# Patient Record
Sex: Male | Born: 1955
Health system: Western US, Academic
[De-identification: ages and names within clinical notes are randomized; demographics above are authoritative.]

## PROBLEM LIST (undated history)

## (undated) DIAGNOSIS — C61 Malignant neoplasm of prostate: Secondary | ICD-10-CM

## (undated) HISTORY — PX: CRYOTHERAPY: SHX1416

## (undated) HISTORY — PX: PROSTATE SURGERY: SHX751

---

## 2019-02-16 LAB — COLOGUARD

## 2020-03-31 ENCOUNTER — Telehealth: Payer: PRIVATE HEALTH INSURANCE

## 2020-03-31 NOTE — Telephone Encounter
Good Afternoon,     Diagnosis-newly diagnosed or second opinion?  New Dx: Hodgkin's Lymphoma     Type of insurance/Is auth needed? If so, status of auth?  [employer] PPO     Date/Time of when appt was scheduled  10/28 at 11:30 AM     Referring MD/ph. Number  Dr. Lizabeth Leyden, N/A     Status of medical records  Patient's spouse will have the medical records faxed to [place]     Where new patient paperwork is supposed to be sent  In the office     Any other pertinent information  N/A

## 2020-04-03 ENCOUNTER — Ambulatory Visit: Payer: PRIVATE HEALTH INSURANCE

## 2020-04-03 DIAGNOSIS — R42 Dizziness and giddiness: Secondary | ICD-10-CM

## 2020-04-03 DIAGNOSIS — R590 Localized enlarged lymph nodes: Secondary | ICD-10-CM

## 2020-04-03 DIAGNOSIS — Z8546 Personal history of malignant neoplasm of prostate: Secondary | ICD-10-CM

## 2020-04-03 DIAGNOSIS — Z7189 Other specified counseling: Secondary | ICD-10-CM

## 2020-04-03 MED ORDER — HYDROCODONE-ACETAMINOPHEN 5-325 MG PO TABS
1 | ORAL_TABLET | Freq: Four times a day (QID) | ORAL | 0 refills | Status: AC | PRN
Start: 2020-04-03 — End: ?

## 2020-04-03 NOTE — Patient Instructions
A pleasure to meet you.  As you know you had had 6 months of gradually progressive intermittent abdominal and now chest pain with some weight loss and difficulty eating normal sized meals. Your Oct 2021 chest CT angiogram showed bulky mediastinal and hilar lymph nodes. Your presentation is highly suggestive of lymphoma.   I recommend ASAP PET scan for full body staging and then ASAP biopsy of the best area on PET to make a diagnosis.    Once we have a tissue diagnosis I will discuss prognosis and best treatment which can easily be pursued in [place].    Placeholder appt in 2 weeks  Sign up on the [facility] PORTAL to send me messages and see results    For pain, try Norco 5/325 one to 2 up to 4 x a day as need for pain (watch for constipation on this rx)

## 2020-04-03 NOTE — Consults
TITLE:  HEMATOLOGY ONCOLOGY CONSULTATION    DATE OF SERVICE:  04/03/2020    REFERRING PHYSICIAN:  Patrick Jupiter, MD       HEMATOLOGIST ONCOLOGIST:  Theressa Millard, MD     Thank you, Dr. Myrtice Lauth, for involving me in the care of this pleasant gentleman.    CHIEF COMPLAINT:  This 64 year old male without significant past medical history except for prostate cancer was referred with chest and abdominal pain and bulky mediastinal adenopathy discovered on recent imaging. Heme-Onc evaluation is requested.    HISTORY OF PRESENT ILLNESS:  The patient has been in generally excellent health. He keeps himself fit and has been a runner. He was, however, diagnosed with prostate cancer, apparently Gleason 7, back in 2013 and underwent a cryoablation procedure at [facility]. To date, he had been on regular followup with [facility] for the first 5 years since then has done annual PSA evaluations with Dr. Myrtice Lauth. There has been no clinical evidence of recurrence.     He noted approximately 6 months ago intermittent abdominal discomfort in the periumbilical to epigastric area with some association with early satiety and some mild weight loss. More recently, he has noticed some discomfort extending into his chest area with particularly a pleuritic component. Since this had flared recently, he went to the emergency room on 03/31/2020 and underwent a rapid appropriate workup. Labs obtained were normal but a chest CT angiogram which ruled out pulmonary emboli showed bulky mediastinal and bilateral hilar adenopathy. The patient was referred by the ER and through Dr. Myrtice Lauth for a rapid Heme-Onc evaluation.     The patient denies any fevers, sweats, anorexia, substantial weight loss, peripheral lymph node swelling, skin rash or bone pain.    OUTPATIENT MEDICATIONS:  No regular medications.    ALLERGIES:  No reported drug allergies.    PAST SURGICAL HISTORY:  Cryoablative treatment of prostate cancer and prior prostate biopsy in 2013.    PAST MEDICAL HISTORY:  1. Intermittent lightheadedness: The patient is a runner and he has noted this is apparently orthostatic, although there may be a positional component. He underwent a workup in December 2020 including a stress echo and Holter which were both negative.  2. COVID-19: Fully Pfizer vaccinated by March 2021.    REVIEW OF SYSTEMS:  As noted above. Otherwise negative in detail for additional constitutional, neurologic, dermatologic, ophthalmologic, otologic, respiratory, cardiovascular, gastrointestinal, genitourinary, or musculoskeletal symptoms.    FAMILY HISTORY:  The patient's father had prostate cancer. Paternal uncle had prostate cancer. His mother, a smoker, had bladder and esophageal cancer. The patient has two brothers and one full and one half-sister and one child. There is no other Heme-Onc history in their family.    SOCIAL HISTORY:  The patient is a never smoker. He does not drink. He consumed alcohol socially but stopped several months ago with his symptoms. He is an avid runner and keeps himself fit. 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    PHYSICAL EXAMINATION:  General: A reasonably healthy-appearing elderly male in no acute distress. Vital Signs: BP 108/75, pulse 63, respirations 16, temp 36.9. Height 175 cm, weight 60.3 kg. O2 saturation 98%. Skin: No jaundice or rash. No significant petechiae, purpura, ecchymoses. Head: Normocephalic, atraumatic. Eyes: Sclerae white. EOMI. Nasopharynx without lesions. Neck: Supple. No increased JVP. Nodes: No palpable neck, clavicular, axillary, or inguinal adenopathy. Lungs: Clear to auscultation and percussion. Heart: Regular rate and rhythm. No murmurs, rubs, or gallops. Abdomen:  Normal bowel sounds. Soft, nontender. No organomegaly or other masses. Extremities: Without cyanosis, clubbing, or edema. Neuro: Oriented, alert. Cranial nerves intact. Motor 5/5 throughout. Gait normal. Mentally normal. Grossly nonfocal.    IMAGING:  03/31/2020 chest CT angiogram: No pulmonary emboli; bulky mediastinal and hilar adenopathy, superior mediastinum 4.1 cm, AP window at 3 cm, right hilar 3.9 cm, subcarinal 6 cm, left hilar 5.1 cm maximum dimension.    LABORATORIES:  03/31/2020 CBC: White count 7.2, hemoglobin 14.0, MCV 90, platelets 274. INR 1.0. CMP normal. Creatinine 0.8.     03/03/2020 CBC was normal with a hemoglobin of 14.4. CMP normal. Creatinine 0.79.     Pathology September 2020: Cologuard negative.    IMPRESSION:  This pleasant 64 year old male presented with his wife for rapid evaluation. Of note, they had bought a house in [place] and were planning to move by in January 2022. I discussed in detail a presumptive malignant diagnosis with gradually progressive intermittent abdominal and now chest pain with some weight loss and early satiety and now bulky mediastinal and hilar adenopathy on chest CT scan. The presentation overall is most suggestive of an occult lymphoma, Hodgkin's or non-Hodgkin's. I recommended an ASAP PET scan for full body staging and to ideally identify an optimal area for tissue sampling. If there is only hilar and mediastinal adenopathy, I would then arrange rapidly for EBUS for a tissue diagnosis and then discuss in detail the optimal treatments. The patient will have a place holder appointment in 2 weeks ideally after the PET scan and biopsy. Arrangements will be made based on those results.     The patient's other issues include the following:  1. Prostate cancer: The patient has been on prostate-specific antigen surveillance and 8 years out from his cryoablative, possibly curative procedure. He apparently has appropriately low/undetectable level.   2. Coronavirus Disease 19: Fully COVID vaccinated by March 2021. I urged the patient to arrange for rapid COVID booster vaccination since his presumptive diagnosis would likely involve therapy including some degree of lymphoid immunosuppression with rituximab or other agents including prednisone.  3. Abdominal pain: I have prescribed Norco 5/325 to be used as needed and emphasized the importance of eating small amounts more frequently and using calorie supplements as needed to maintain his weight.  4. Episodic lightheadedness: Cardiac workup was negative in the past. Not a more active recent issue.  5. Possible right inguinal hernia: Noted on exam and easily reducible.   Thank you for involving me in the care of this patient.      Theressa Millard, MD [phone])        ACB/MODL CONF#: [id number]  D: 04/03/2020 12:27:05 T: 04/03/2020 13:53:16 DOCUMENT: [id number]

## 2020-04-04 ENCOUNTER — Other Ambulatory Visit: Payer: Self-pay

## 2020-04-04 ENCOUNTER — Encounter: Payer: Self-pay | Admitting: Radiation Oncology

## 2020-04-04 ENCOUNTER — Ambulatory Visit
Admission: RE | Admit: 2020-04-04 | Discharge: 2020-04-04 | Disposition: A | Discharge status: Home / Self Care | Payer: 59 | Source: Ambulatory Visit | Attending: Radiation Oncology | Admitting: Radiation Oncology

## 2020-04-04 VITALS — Ht 68.0 in | Wt 133.0 lb

## 2020-04-04 DIAGNOSIS — R590 Localized enlarged lymph nodes: Secondary | ICD-10-CM

## 2020-04-04 DIAGNOSIS — R918 Other nonspecific abnormal finding of lung field: Secondary | ICD-10-CM

## 2020-04-04 DIAGNOSIS — C61 Malignant neoplasm of prostate: Secondary | ICD-10-CM | POA: Insufficient documentation

## 2020-04-04 HISTORY — DX: Malignant neoplasm of prostate: C61

## 2020-04-04 NOTE — Addendum Note (Signed)
Encounter addended by: Tyler Pita, MD on: 04/04/2020 4:11 PM  Actions taken: Medication List reviewed, Problem List reviewed, Allergies reviewed, Problem List modified, Visit diagnoses modified, Order list changed, Diagnosis association updated

## 2020-04-04 NOTE — Progress Notes (Signed)
Patient and wife plan to be in [place] from [place] by Monday in preparation for scan on Wednesday. Patient explains that six months ago he began having GI problems. He related the diarrhea, stomach pain and indigestion to stress. Wife explains the patient is very healthy and a runner. She reports he has lost approximately 10 lbs. Patient reports swallowing food is painful. Patient goes onto explain that ten days ago he began having chest pain so severe he went to the ED. Patient explains that following a scan in the ED he was told he has "growths on his lungs." Patient states, "one month prior all my blood work was completely normal."   Patient reports intermittent chest pain 6 on a scale of 0-10 unless he "has an attack" then is a 9. Patient reports he rest in an attempt to relieve his pain.  Patient reports shortness of breath. Patient reports his ribs hurt when he takes a deep breath. Patient reports a periodic productive cough with clear phlegm. Denies hemoptysis. Denies nausea or vomiting. Reports severe sporatic headaches typically around the same time as an episode of chest pain. Denies nausea or vomiting. Reports sleeping without difficulty. Reports a normal appetite. Denies a history of smoking outside of recreational [place] as a teenager. Denies a history of radiation therapy. Denies having a pacemaker. Reports having cryotherapy in the past for prostate ca. States, "my father died of prostate cancer."  Reports he is on medical leave from [employer] presently. [name], wife, reports faxing all records over this morning to our office.

## 2020-04-06 NOTE — Progress Notes (Signed)
[place]         [phone] ________________________________  Initial Telephone Consultation  Name: Bryan [name] MRN: [id number]  Date: 04/04/2020  DOB: Dec 04, 1955  REFERRING PHYSICIAN: No ref. provider found  DIAGNOSIS: The primary encounter diagnosis was Lung mass. Diagnoses of Malignant neoplasm of prostate ([place]) and Localized enlarged lymph nodes were also pertinent to this visit.    ICD-10-CM   1. Lung mass  R91.8   2. Malignant neoplasm of prostate ([place])  C61    s/p cryotherapy in 2000 in [place], PSA is now low  3. 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 He keeps himself fit and has been a runner. He was, however, diagnosed with prostate cancer, apparently Gleason 7, back in 2013 and underwent a cryoablation procedure at [facility]. To date, he had been on regular followup with [facility] for the first 5 years since then was done followed by annual PSA evaluations with his PCP, Dr. Sheria Lang. There has been no clinical evidence of recurrence.   He had planned to move to [place] after retirement, and was in the process of doing this.   He noted approximately 6 months ago intermittent abdominal discomfort in the periumbilical to epigastric area with some association with early satiety and some mild weight loss. More recently, he has noticed some discomfort extending into his chest area with particularly a pleuritic component. Since this had flared recently, he went to the emergency room on 03/31/2020 and underwent a rapid appropriate workup. Labs obtained were normal but a chest CT angiogram which ruled out pulmonary emboli showed bulky mediastinal and bilateral hilar adenopathy. The patient was referred by the ER and through Dr. Sheria Lang for a rapid Heme-Onc evaluation, with Dr. Karleen Dolphin at [facility] in [place], [place].  Dr. Renard Hamper ordered a PET-CT to be followed by an effort to obtain tissue diagnosis.  The scan was scheduled, with a 3 week delay.  So, Mr. Chicoine took the initiative to establish care here in [place], in hopes of expediting his work-up.    The patient denies any fevers, sweats, anorexia, substantial weight loss, peripheral lymph node swelling, skin rash or bone pain.  [name]  PREVIOUS RADIATION THERAPY: No  Past Medical History:  Diagnosis Date  . Prostate cancer ([place])   .Intermittent lightheadedness: The patient is a runner and he has noted this is apparently orthostatic, although there may be a positional component. He underwent a workup in December 2020 including a stress echo and Holter which were both negative.   COVID-19: Fully Pfizer vaccinated by March 2021.   :  Past Surgical History:  Procedure Laterality Date  . CRYOTHERAPY    :   Current Outpatient Medications:  .  acetaminophen-codeine (TYLENOL #3) 300-30 MG tablet, Take by mouth every 4 (four) hours as needed for moderate pain., Disp: , Rfl: :  No Known Allergies:  Family History  Problem Relation Age of Onset  . Bladder Cancer Mother   . Esophageal cancer Mother   . Prostate cancer Father   . Prostate cancer Paternal Uncle   :  Social History   Socioeconomic History  . Marital status: Married    Spouse name: 1  . Number of children: Not on file  . Years of education: Not on file  .  Highest education level: Not on file  Occupational History  . Occupation: Futures trader: [employer]    Comment: medical leave  Tobacco Use  . Smoking status: Never Smoker  . Smokeless tobacco: Never Used  Vaping Use  . Vaping Use: Never used  Substance and Sexual Activity  . Alcohol use: Not Currently  . Drug use: Never  . Sexual activity: Yes  Other Topics Concern  . Not on file  Social History Narrative  . Not on file   Social Determinants of Health   Financial  Resource Strain:   . Difficulty of Paying Living Expenses: Not on file  Food Insecurity:   . Worried About Charity fundraiser in the Last Year: Not on file  . Ran Out of Food in the Last Year: Not on file  Transportation Needs:   . Lack of Transportation (Medical): Not on file  . Lack of Transportation (Non-Medical): Not on file  Physical Activity:   . Days of Exercise per Week: Not on file  . Minutes of Exercise per Session: Not on file  Stress:   . Feeling of Stress : Not on file  Social Connections:   . Frequency of Communication with Friends and Family: Not on file  . Frequency of Social Gatherings with Friends and Family: Not on file  . Attends Religious Services: Not on file  . Active Member of Clubs or Organizations: Not on file  . Attends Archivist Meetings: Not on file  . Marital Status: Not on file  Intimate Partner Violence:   . Fear of Current or Ex-Partner: Not on file  . Emotionally Abused: Not on file  . Physically Abused: Not on file  . Sexually Abused: Not on file  :  REVIEW OF SYSTEMS:  A 15 point review of systems is documented in the electronic medical record. This was obtained by the nursing staff. However, I reviewed this with the patient to discuss relevant findings and make appropriate changes.  Pertinent items are noted in HPI.   PHYSICAL EXAM:  Height 5\' 8"  (1.727 m), weight 133 lb (60.3 kg). Detailed exam was deferred today, since this was a telephone visit.  KPS = 80  100 - Normal; no complaints; no evidence of disease. 90   - Able to carry on normal activity; minor signs or symptoms of disease. 80   - Normal activity with effort; some signs or symptoms of disease. 58   - Cares for self; unable to carry on normal activity or to do active work. 60   - Requires occasional assistance, but is able to care for most of his personal needs. 50   - Requires considerable assistance and frequent medical care. 41   - Disabled; requires special care  and assistance. 64   - Severely disabled; hospital admission is indicated although death not imminent. 78   - Very sick; hospital admission necessary; active supportive treatment necessary. 10   - Moribund; fatal processes progressing rapidly. 0     - Dead  Karnofsky DA, Abelmann WH, Craver LS and Burchenal JH [phone]) The use of the nitrogen mustards in the palliative treatment of carcinoma: with particular reference to bronchogenic carcinoma Cancer 1 634-56  LABORATORY DATA:  No results found for: WBC, HGB, HCT, MCV, PLT No results found for: NA, K, CL, CO2 No results found for: ALT, AST, GGT, ALKPHOS, BILITOT   RADIOGRAPHY: No results found.    IMPRESSION: This pleasant 64 year old male presented with  his wife for rapid evaluation. We discussed in detail a presumptive malignant diagnosis with gradually progressive intermittent abdominal and now chest pain with some weight loss and early satiety and now bulky mediastinal and hilar adenopathy on chest CT scan. I agree with ASAP PET scan for full body staging and to ideally identify an optimal area for tissue sampling. If there is only hilar and mediastinal adenopathy, I would then arrange rapidly for EBUS for a tissue diagnosis and then discuss in detail the optimal treatments.   PLAN: PET next week.   The patient will travel to [facility] for PET Tuesday, and we'll follow-up with appropriate referrals to medical oncology and a proceduralist for tissue.  This encounter was provided by telephone.  The patient has given verbal consent for this type of encounter and has been advised to only accept a meeting of this type in a secure network environment. The time spent during this encounter was 20 minutes. The attendants for this meeting include Tyler Pita  and Durene Fruits.  During the encounter, Tyler Pita was located at [facility] Radiation Oncology Department.  Corin Tilly was located at home in [place], [place].     ------------------------------------------------   Tyler Pita, MD [place] Director and Director of Stereotactic Radiosurgery Direct Dial: [phone]  Fax: [phone] [email or web address]  Skype  [email or web address]

## 2020-04-06 NOTE — Addendum Note (Signed)
Encounter addended by: Tyler Pita, MD on: 04/06/2020 5:59 PM  Actions taken: Clinical Note Signed, Level of Service modified

## 2020-04-07 ENCOUNTER — Telehealth: Payer: Self-pay | Admitting: Radiation Oncology

## 2020-04-07 NOTE — Telephone Encounter (Signed)
Per [name]'s request, I left a vm with our fax number so she can fax over the medical records once they reach their destination this evening. [name] stated that she originally faxed over the records 04/04/20 but we never received them.

## 2020-04-08 ENCOUNTER — Encounter (HOSPITAL_COMMUNITY): Payer: Self-pay

## 2020-04-08 ENCOUNTER — Ambulatory Visit (HOSPITAL_COMMUNITY)
Admission: RE | Admit: 2020-04-08 | Discharge: 2020-04-08 | Disposition: A | Discharge status: Home / Self Care | Payer: 59 | Source: Ambulatory Visit | Attending: Radiation Oncology | Admitting: Radiation Oncology

## 2020-04-08 ENCOUNTER — Other Ambulatory Visit: Payer: Self-pay

## 2020-04-08 ENCOUNTER — Telehealth: Payer: Self-pay | Admitting: Radiation Oncology

## 2020-04-08 ENCOUNTER — Other Ambulatory Visit: Payer: Self-pay | Admitting: Radiation Oncology

## 2020-04-08 DIAGNOSIS — R599 Enlarged lymph nodes, unspecified: Secondary | ICD-10-CM

## 2020-04-08 DIAGNOSIS — R590 Localized enlarged lymph nodes: Secondary | ICD-10-CM | POA: Diagnosis not present

## 2020-04-08 LAB — GLUCOSE, CAPILLARY: Glucose-Capillary: 85 mg/dL (ref 70–99)

## 2020-04-08 MED ORDER — FLUDEOXYGLUCOSE F - 18 (FDG) INJECTION
6.6500 | Freq: Once | INTRAVENOUS | Status: AC | PRN
  Administered 2020-04-08: 6.65 via INTRAVENOUS

## 2020-04-08 NOTE — Telephone Encounter (Signed)
Received voicemail message from patient's wife, [name], requesting a return call. Phoned back to inquire. Spoke with [name] and Marcello Moores both via speaker phone. [name] reports her husband has just finished his PET scan and questions "what do we do next?" Explained it would take time to review the scan and dictate a report. Assured them both as soon as results are in they will be contacted. Both verbalized understanding and expressed appreciation for the call back.

## 2020-04-08 NOTE — Progress Notes (Unsigned)
Bryan [name] Male, 64 y.o., 1955-09-01 MRN:  [id number] Phone:  [phone] (H) PCP:  Patient, No Pcp Per Coverage:  [employer] Other Next Appt With Radiology ([facility]-US 2) 04/10/2020 at 8:00 AM Message Received: Today Message Details  Markus Daft, MD  Lenore Cordia Dr. Tammi Klippel is asking for biopsy on this patient. Ok to schedule US guided core biopsy of left axillary node / left cervical lymph node.

## 2020-04-09 ENCOUNTER — Other Ambulatory Visit: Payer: Self-pay | Admitting: Radiology

## 2020-04-09 ENCOUNTER — Other Ambulatory Visit: Payer: Self-pay | Admitting: Physician Assistant

## 2020-04-09 ENCOUNTER — Other Ambulatory Visit: Payer: Self-pay | Admitting: Student

## 2020-04-10 ENCOUNTER — Other Ambulatory Visit: Payer: Self-pay

## 2020-04-10 ENCOUNTER — Ambulatory Visit (HOSPITAL_COMMUNITY)
Admission: RE | Admit: 2020-04-10 | Discharge: 2020-04-10 | Disposition: A | Discharge status: Home / Self Care | Payer: 59 | Source: Ambulatory Visit | Attending: Radiation Oncology | Admitting: Radiation Oncology

## 2020-04-10 ENCOUNTER — Encounter (HOSPITAL_COMMUNITY): Payer: Self-pay

## 2020-04-10 ENCOUNTER — Inpatient Hospital Stay: Payer: 59

## 2020-04-10 ENCOUNTER — Telehealth: Payer: Self-pay | Admitting: Hematology

## 2020-04-10 ENCOUNTER — Inpatient Hospital Stay: Payer: 59 | Attending: Hematology | Admitting: Hematology

## 2020-04-10 VITALS — BP 99/67 | HR 55 | Temp 95.4°F | Resp 18 | Ht 68.0 in | Wt 136.8 lb

## 2020-04-10 DIAGNOSIS — Z8546 Personal history of malignant neoplasm of prostate: Secondary | ICD-10-CM | POA: Diagnosis not present

## 2020-04-10 DIAGNOSIS — C8598 Non-Hodgkin lymphoma, unspecified, lymph nodes of multiple sites: Secondary | ICD-10-CM | POA: Diagnosis present

## 2020-04-10 DIAGNOSIS — R599 Enlarged lymph nodes, unspecified: Secondary | ICD-10-CM

## 2020-04-10 DIAGNOSIS — R590 Localized enlarged lymph nodes: Secondary | ICD-10-CM | POA: Diagnosis present

## 2020-04-10 LAB — CBC
HCT: 40.4 % (ref 39.0–52.0)
Hemoglobin: 13.4 g/dL (ref 13.0–17.0)
MCH: 30.2 pg (ref 26.0–34.0)
MCHC: 33.2 g/dL (ref 30.0–36.0)
MCV: 91.2 fL (ref 80.0–100.0)
Platelets: 393 10*3/uL (ref 150–400)
RBC: 4.43 MIL/uL (ref 4.22–5.81)
RDW: 11.4 % — ABNORMAL LOW (ref 11.5–15.5)
WBC: 6.6 10*3/uL (ref 4.0–10.5)
nRBC: 0 % (ref 0.0–0.2)

## 2020-04-10 LAB — CBC WITH DIFFERENTIAL/PLATELET
Abs Immature Granulocytes: 0.03 10*3/uL (ref 0.00–0.07)
Basophils Absolute: 0.1 10*3/uL (ref 0.0–0.1)
Basophils Relative: 2 %
Eosinophils Absolute: 0.4 10*3/uL (ref 0.0–0.5)
Eosinophils Relative: 6 %
HCT: 38.9 % — ABNORMAL LOW (ref 39.0–52.0)
Hemoglobin: 13 g/dL (ref 13.0–17.0)
Immature Granulocytes: 0 %
Lymphocytes Relative: 16 %
Lymphs Abs: 1.1 10*3/uL (ref 0.7–4.0)
MCH: 30 pg (ref 26.0–34.0)
MCHC: 33.4 g/dL (ref 30.0–36.0)
MCV: 89.8 fL (ref 80.0–100.0)
Monocytes Absolute: 0.6 10*3/uL (ref 0.1–1.0)
Monocytes Relative: 9 %
Neutro Abs: 4.5 10*3/uL (ref 1.7–7.7)
Neutrophils Relative %: 67 %
Platelets: 371 10*3/uL (ref 150–400)
RBC: 4.33 MIL/uL (ref 4.22–5.81)
RDW: 11.5 % (ref 11.5–15.5)
WBC: 6.7 10*3/uL (ref 4.0–10.5)
nRBC: 0 % (ref 0.0–0.2)

## 2020-04-10 LAB — URIC ACID: Uric Acid, Serum: 6.3 mg/dL (ref 3.7–8.6)

## 2020-04-10 LAB — CMP (CANCER CENTER ONLY)
ALT: 16 U/L (ref 0–44)
AST: 17 U/L (ref 15–41)
Albumin: 3.4 g/dL — ABNORMAL LOW (ref 3.5–5.0)
Alkaline Phosphatase: 79 U/L (ref 38–126)
Anion gap: 6 (ref 5–15)
BUN: 13 mg/dL (ref 8–23)
CO2: 30 mmol/L (ref 22–32)
Calcium: 9.1 mg/dL (ref 8.9–10.3)
Chloride: 104 mmol/L (ref 98–111)
Creatinine: 0.75 mg/dL (ref 0.61–1.24)
GFR, Estimated: >60 mL/min (ref >60)
Glucose, Bld: 62 mg/dL — ABNORMAL LOW (ref 70–99)
Potassium: 3.8 mmol/L (ref 3.5–5.1)
Sodium: 140 mmol/L (ref 135–145)
Total Bilirubin: 0.3 mg/dL (ref 0.3–1.2)
Total Protein: 6.7 g/dL (ref 6.5–8.1)

## 2020-04-10 LAB — ABO/RH: ABO/RH(D): O POS

## 2020-04-10 LAB — PROTIME-INR
INR: 1 (ref 0.8–1.2)
Prothrombin Time: 12.5 seconds (ref 11.4–15.2)

## 2020-04-10 LAB — HEPATITIS B SURFACE ANTIGEN: Hepatitis B Surface Ag: NONREACTIVE

## 2020-04-10 LAB — HIV ANTIBODY (ROUTINE TESTING W REFLEX): HIV Screen 4th Generation wRfx: NONREACTIVE

## 2020-04-10 LAB — HEPATITIS B CORE ANTIBODY, TOTAL: Hep B Core Total Ab: NONREACTIVE

## 2020-04-10 LAB — HEPATITIS C ANTIBODY: HCV Ab: NONREACTIVE

## 2020-04-10 LAB — LACTATE DEHYDROGENASE: LDH: 200 U/L — ABNORMAL HIGH (ref 98–192)

## 2020-04-10 MED ORDER — FENTANYL CITRATE (PF) 100 MCG/2ML IJ SOLN
INTRAMUSCULAR | Status: AC
  Filled 2020-04-10: qty 2

## 2020-04-10 MED ORDER — PREDNISONE 50 MG PO TABS
50.0000 mg | ORAL_TABLET | Freq: Every day | ORAL | 0 refills | Status: DC
Start: 2020-04-10 — End: 2020-04-25

## 2020-04-10 MED ORDER — MIDAZOLAM HCL 2 MG/2ML IJ SOLN
INTRAMUSCULAR | Status: AC | PRN
  Administered 2020-04-10: 1 mg via INTRAVENOUS

## 2020-04-10 MED ORDER — MIDAZOLAM HCL 2 MG/2ML IJ SOLN
INTRAMUSCULAR | Status: AC
  Filled 2020-04-10: qty 2

## 2020-04-10 MED ORDER — FENTANYL CITRATE (PF) 100 MCG/2ML IJ SOLN
INTRAMUSCULAR | Status: AC | PRN
Start: 2020-04-10 — End: 2020-04-10
  Administered 2020-04-10: 25 ug via INTRAVENOUS

## 2020-04-10 MED ORDER — SODIUM CHLORIDE 0.9 % IV SOLN: INTRAVENOUS | Status: DC

## 2020-04-10 MED ORDER — LIDOCAINE HCL (PF) 1 % IJ SOLN
INTRAMUSCULAR | Status: AC
  Filled 2020-04-10: qty 30

## 2020-04-10 NOTE — Progress Notes (Signed)
Patient was given discharge instructions. He verbalized understanding. 

## 2020-04-10 NOTE — H&P (Signed)
Chief Complaint: Patient was seen in consultation today for possible left axillary/left cervical lymph node biopsy.  Referring Physician(s): Manning,Matthew  Supervising Physician: Daryll Brod  Patient Status: [facility] - Out-pt  History of Present Illness: Bryan [name] is a 64 y.o. male with a past medical history significant for prostate cancer s/p cryoablation who presents today for a possible left axillary/left cervical lymph node biopsy. Bryan [name] noted intermittent abdominal pain approximately 6 months ago with early satiety and weight loss, eventually he also began to notice chest discomfort as well and presented to the ED in [place] on 03/31/20. Imaging during his ED visit noted bulky mediastinal and bilateral hilar adenopathy and he was referred to heme/onc in [place], however his workup was to be delayed by 3 weeks so he decided to establish care in [place] instead as he was planning to move here for retirement shortly. He was seen by Dr. Tammi Klippel on 10/29 and underwent a PET scan on 11/2 which noted intensely hypermetabolic bilateral neck, axillary, mediastinal, hilar and retroperitoneal lymphadenopathy concerning for myeloproliferative disorder. He has been referred to IR for an image guided lymph node biopsy to further direct care.  Bryan [name] denies any significant pain or discomfort this morning, he continues to have some shortness of breath which is about the same as it has been for the past few months. He understands the procedure today and is agreeable to proceed as planned.  Past Medical History:  Diagnosis Date  . Prostate cancer [facility])     Past Surgical History:  Procedure Laterality Date  . CRYOTHERAPY    . PROSTATE SURGERY      Allergies: Patient has no known allergies.  Medications: Prior to Admission medications   Medication Sig Start Date End Date Taking? Authorizing Provider  acetaminophen-codeine (TYLENOL #3) 300-30 MG tablet Take by mouth every 4 (four) hours as  needed for moderate pain.   Yes [provider]     Family History  Problem Relation Age of Onset  . Bladder Cancer Mother   . Esophageal cancer Mother   . Prostate cancer Father   . Prostate cancer Paternal Uncle     Social History   Socioeconomic History  . Marital status: Married    Spouse name: 1  . Number of children: Not on file  . Years of education: Not on file  . Highest education level: Not on file  Occupational History  . Occupation: Futures trader: [employer]    Comment: medical leave  Tobacco Use  . Smoking status: Never Smoker  . Smokeless tobacco: Never Used  Vaping Use  . Vaping Use: Never used  Substance and Sexual Activity  . Alcohol use: Not Currently  . Drug use: Never  . Sexual activity: Yes  Other Topics Concern  . Not on file  Social History Narrative  . Not on file   Social Determinants of Health   Financial Resource Strain:   . Difficulty of Paying Living Expenses: Not on file  Food Insecurity:   . Worried About Charity fundraiser in the Last Year: Not on file  . Ran Out of Food in the Last Year: Not on file  Transportation Needs:   . Lack of Transportation (Medical): Not on file  . Lack of Transportation (Non-Medical): Not on file  Physical Activity:   . Days of Exercise per Week: Not on file  . Minutes of Exercise per Session: Not on file  Stress:   . Feeling of  Stress : Not on file  Social Connections:   . Frequency of Communication with Friends and Family: Not on file  . Frequency of Social Gatherings with Friends and Family: Not on file  . Attends Religious Services: Not on file  . Active Member of Clubs or Organizations: Not on file  . Attends Archivist Meetings: Not on file  . Marital Status: Not on file     Review of Systems: A 12 point ROS discussed and pertinent positives are indicated in the HPI above.  All other systems are negative.  Review of Systems  Constitutional: Negative for chills  and fever.  Respiratory: Positive for shortness of breath. Negative for cough.   Cardiovascular: Negative for chest pain.  Gastrointestinal: Negative for abdominal pain, diarrhea, nausea and vomiting.  Musculoskeletal: Negative for back pain.  Neurological: Negative for dizziness and headaches.    Vital Signs: BP 108/71   Pulse (!) 52   Temp 98.2 F (36.8 C) (Oral)   Resp 16   Ht 5\' 8"  (1.727 m)   Wt 135 lb (61.2 kg)   SpO2 99%   BMI 20.53 kg/m   Physical Exam Vitals and nursing note reviewed.  Constitutional:      General: He is not in acute distress. HENT:     Head: Normocephalic.     Mouth/Throat:     Mouth: Mucous membranes are moist.     Pharynx: Oropharynx is clear. No oropharyngeal exudate or posterior oropharyngeal erythema.  Cardiovascular:     Rate and Rhythm: Normal rate and regular rhythm.  Pulmonary:     Effort: Pulmonary effort is normal.     Breath sounds: Normal breath sounds.  Abdominal:     General: There is no distension.     Palpations: Abdomen is soft.     Tenderness: There is no abdominal tenderness.  Skin:    General: Skin is warm and dry.  Neurological:     Mental Status: He is alert and oriented to person, place, and time.  Psychiatric:        Mood and Affect: Mood normal.        Behavior: Behavior normal.        Thought Content: Thought content normal.        Judgment: Judgment normal.      MD Evaluation Airway: WNL Heart: WNL Abdomen: WNL Chest/ Lungs: WNL ASA  Classification: 2 Mallampati/Airway Score: One   Imaging: NM PET Image Initial (PI) Skull Base To Thigh  Result Date: 04/08/2020 CLINICAL DATA:  Initial treatment strategy for reported bulky mediastinal and bilateral hilar lymphadenopathy on recent outside chest CT angiogram study. History of prostate cancer in 2013. EXAM: NUCLEAR MEDICINE PET SKULL BASE TO THIGH TECHNIQUE: 6.7 mCi F-18 FDG was injected intravenously. Full-ring PET imaging was performed from the skull  base to thigh after the radiotracer. CT data was obtained and used for attenuation correction and anatomic localization. Fasting blood glucose: 85 mg/dl COMPARISON:  None. FINDINGS: Mediastinal blood pool activity: SUV max 1.9 Liver activity: SUV max 2.6 NECK: Mildly to moderately enlarged hypermetabolic bilateral neck lymph nodes involving level II on the right and levels III and IV on the left. Representative 0.9 cm right level II neck node with max SUV 15.5 (series 4/image 36). Representative 1.8 cm left level IV/supraclavicular neck node with max SUV 22.6 (series 4/image 40). Incidental CT findings: none CHEST: Mildly enlarged hypermetabolic bilateral axillary lymph nodes, largest 1.6 cm on the left with max SUV 21.5 (series  4/image 55). Bulky hypermetabolic bilateral mediastinal and bilateral hilar lymph nodes. Representative 3.3 cm short axis diameter subcarinal node with max SUV 22.1 (series 4/image 76). Representative 2.1 cm AP window node with max SUV 18.8 (series 4/image 68). Representative high right paratracheal 1.2 cm node near the thoracic inlet with max SUV 20.7 (series 4/image 56). Representative 1.1 cm anterior low paraesophageal node with max SUV 10.8 (series 4/image 100). Right hilar adenopathy with max SUV 24.9 and left hilar adenopathy with max SUV 20.3. Several necrotic lymph nodes are scattered in the mediastinum and hila, for example measuring 3.4 cm in the high right mediastinum near the thoracic inlet (series 4/image 51). No hypermetabolic pulmonary findings. Incidental CT findings: Left anterior descending and right coronary atherosclerosis. Minimally atherosclerotic nonaneurysmal thoracic aorta. No significant pulmonary nodules. ABDOMEN/PELVIS: Numerous hypermetabolic slightly hypodense masses throughout the spleen, largest 2.1 cm in the inferior spleen with max SUV 12.5 (series 4/image 120). Spleen is overall normal size. Several hypermetabolic enlarged lymph nodes throughout the  retroperitoneum involving left para celiac and aortocaval chains. Representative largely necrotic high left para celiac 3.2 cm node with max SUV 10.0 (series 4/image 113). Splenic hilar 1.2 cm node with max SUV 17.6. Aortocaval 0.7 cm node with max SUV 12.7 (series 4/image 120). No enlarged or hypermetabolic pelvic lymph nodes. No abnormal hypermetabolic activity within the liver, pancreas or adrenal glands. Incidental CT findings: Top-normal size prostate with scattered nonspecific internal prostatic calcifications. SKELETON: No focal hypermetabolic activity to suggest skeletal metastasis. Incidental CT findings: none IMPRESSION: 1. Intensely hypermetabolic bilateral neck, bilateral axillary, bilateral mediastinal, bilateral hilar and retroperitoneal lymphadenopathy. Numerous small hypermetabolic splenic masses. Findings are compatible with malignancy, favoring lymphoproliferative disorder. The most accessible pathologic lymph nodes for potential percutaneous biopsy are likely within the left axilla and left supraclavicular neck. 2. Chronic findings include: Aortic Atherosclerosis (ICD10-I70.0). Coronary atherosclerosis. Electronically Signed   By: Ilona Sorrel M.D.   On: 04/08/2020 13:58    Labs:  CBC: Recent Labs    04/10/20 0557  WBC 6.6  HGB 13.4  HCT 40.4  PLT 393    COAGS: Recent Labs    04/10/20 0557  INR 1.0    BMP: No results for input(s): NA, K, CL, CO2, GLUCOSE, BUN, CALCIUM, CREATININE, GFRNONAA, GFRAA in the last 8760 hours.  Invalid input(s): CMP  LIVER FUNCTION TESTS: No results for input(s): BILITOT, AST, ALT, ALKPHOS, PROT, ALBUMIN in the last 8760 hours.  TUMOR MARKERS: No results for input(s): AFPTM, CEA, CA199, CHROMGRNA in the last 8760 hours.  Assessment and Plan:  64 y/o M with recently noted diffuse lymphadenopathy concerning for lymphoproliferative disorder who presents today for a possible left axillary/left cervical lymph node biopsy with moderate  sedation.  Patient has been NPO since midnight, no current anticoagulation/antiplatelet medications. Afebrile, WBC 6.6, hgb 13.4, plt 393, INR 1.0.  Risks and benefits of left axillary/left cervical lymph node biopsy was discussed with the patient and/or patient's family including, but not limited to bleeding, infection, damage to adjacent structures or low yield requiring additional tests.  All of the questions were answered and there is agreement to proceed.  Consent signed and in chart.   Thank you for this interesting consult.  I greatly enjoyed meeting Bryan [name] and look forward to participating in their care.  A copy of this report was sent to the requesting provider on this date.  Electronically Signed: Joaquim Nam, PA-C 04/10/2020, 7:50 AM   I spent a total of 30 Minutes  in  face to face in clinical consultation, greater than 50% of which was counseling/coordinating care for left axillary/left cervical lymph node biopsy.

## 2020-04-10 NOTE — Telephone Encounter (Signed)
Scheduled per 11/4 los. Pt is aware of appt time and date.

## 2020-04-10 NOTE — Procedures (Signed)
Interventional Radiology Procedure Note  Procedure: [place] LEFT SUPRACLAVICULAR ADENOPATHY CORE BX     Complications: None  Estimated Blood Loss:  MIN  Findings: 35 G CORES    MDaryll Brod, MD

## 2020-04-10 NOTE — Discharge Instructions (Addendum)
Needle Biopsy, Care After These instructions tell you how to care for yourself after your procedure. Your doctor may also give you more specific instructions. Call your doctor if you have any problems or questions. What can I expect after the procedure? After the procedure, it is common to have:  Soreness.  Bruising.  Mild pain. Follow these instructions at home:   Return to your normal activities as told by your doctor. Ask your doctor what activities are safe for you.  Take over-the-counter and prescription medicines only as told by your doctor.  Wash your hands with soap and water before you change your bandage (dressing). If you cannot use soap and water, use hand sanitizer.  Follow instructions from your doctor about: ? How to take care of your puncture site. ? When and how to change your bandage. ? When to remove your bandage.  Check your puncture site every day for signs of infection. Watch for: ? Redness, swelling, or pain. ? Fluid or blood. ? Pus or a bad smell. ? Warmth.  Do not take baths, swim, or use a hot tub until your doctor approves. Ask your doctor if you may take showers. You may only be allowed to take sponge baths.  Keep all follow-up visits as told by your doctor. This is important. Contact a doctor if you have:  A fever.  Redness, swelling, or pain at the puncture site, and it lasts longer than a few days.  Fluid, blood, or pus coming from the puncture site.  Warmth coming from the puncture site. Get help right away if:  You have a lot of bleeding from the puncture site. Summary  After the procedure, it is common to have soreness, bruising, or mild pain at the puncture site.  Check your puncture site every day for signs of infection, such as redness, swelling, or pain.  Get help right away if you have severe bleeding from your puncture site. This information is not intended to replace advice given to you by your health care provider. Make  sure you discuss any questions you have with your health care provider. Document Revised: 06/06/2017 Document Reviewed: 06/06/2017 Elsevier Patient Education  2020 Elsevier Inc. Moderate Conscious Sedation, Adult Sedation is the use of medicines to promote relaxation and relieve discomfort and anxiety. Moderate conscious sedation is a type of sedation. Under moderate conscious sedation, you are less alert than normal, but you are still able to respond to instructions, touch, or both. Moderate conscious sedation is used during short medical and dental procedures. It is milder than deep sedation, which is a type of sedation under which you cannot be easily woken up. It is also milder than general anesthesia, which is the use of medicines to make you unconscious. Moderate conscious sedation allows you to return to your regular activities sooner. Tell a health care provider about:  Any allergies you have.  All medicines you are taking, including vitamins, herbs, eye drops, creams, and over-the-counter medicines.  Use of steroids (by mouth or creams).  Any problems you or family members have had with sedatives and anesthetic medicines.  Any blood disorders you have.  Any surgeries you have had.  Any medical conditions you have, such as sleep apnea.  Whether you are pregnant or may be pregnant.  Any use of cigarettes, alcohol, marijuana, or street drugs. What are the risks? Generally, this is a safe procedure. However, problems may occur, including:  Getting too much medicine (oversedation).  Nausea.  Allergic reaction to   medicines.  Trouble breathing. If this happens, a breathing tube may be used to help with breathing. It will be removed when you are awake and breathing on your own.  Heart trouble.  Lung trouble. What happens before the procedure? Staying hydrated Follow instructions from your health care provider about hydration, which may include:  Up to 2 hours before the  procedure - you may continue to drink clear liquids, such as water, clear fruit juice, black coffee, and plain tea. Eating and drinking restrictions Follow instructions from your health care provider about eating and drinking, which may include:  8 hours before the procedure - stop eating heavy meals or foods such as meat, fried foods, or fatty foods.  6 hours before the procedure - stop eating light meals or foods, such as toast or cereal.  6 hours before the procedure - stop drinking milk or drinks that contain milk.  2 hours before the procedure - stop drinking clear liquids. Medicine Ask your health care provider about:  Changing or stopping your regular medicines. This is especially important if you are taking diabetes medicines or blood thinners.  Taking medicines such as aspirin and ibuprofen. These medicines can thin your blood. Do not take these medicines before your procedure if your health care provider instructs you not to.  Tests and exams  You will have a physical exam.  You may have blood tests done to show: ? How well your kidneys and liver are working. ? How well your blood can clot. General instructions  Plan to have someone take you home from the hospital or clinic.  If you will be going home right after the procedure, plan to have someone with you for 24 hours. What happens during the procedure?  An IV tube will be inserted into one of your veins.  Medicine to help you relax (sedative) will be given through the IV tube.  The medical or dental procedure will be performed. What happens after the procedure?  Your blood pressure, heart rate, breathing rate, and blood oxygen level will be monitored often until the medicines you were given have worn off.  Do not drive for 24 hours. This information is not intended to replace advice given to you by your health care provider. Make sure you discuss any questions you have with your health care provider. Document  Revised: 05/06/2017 Document Reviewed: 09/13/2015 Elsevier Patient Education  2020 Elsevier Inc.  

## 2020-04-10 NOTE — Patient Instructions (Signed)
Thank you for choosing [facility] to provide your oncology and hematology care.   Should you have questions after your visit to the [facility] ([facility]), please contact this office at [phone] between 8:30 AM and 4:30 PM.  Voice mails left after 4:00 PM may not be returned until the following business day.  Calls received after 4:30 PM will be answered by an off-site Nurse Triage Line.    Prescription Refills:  Please have your pharmacy contact us directly for most prescription requests.  Contact the office directly for refills of narcotics (pain medications). Allow 48-72 hours for refills.  Appointments: Please contact the [facility] scheduling department [phone] for questions regarding [facility] appointment scheduling.  Contact the schedulers with any scheduling changes so that your appointment can be rescheduled in a timely manner.   Central Scheduling for [place] [phone] - Call to schedule procedures such as PET scans, CT scans, MRI, Ultrasound, etc.  To afford each patient quality time with our providers, please arrive 30 minutes before your scheduled appointment time.  If you arrive late for your appointment, you may be asked to reschedule.  We strive to give you quality time with our providers, and arriving late affects you and other patients whose appointments are after yours. If you are a no show for multiple scheduled visits, you may be dismissed from the clinic at the providers discretion.     Resources: [facility] Social Workers [phone] for additional information on assistance programs or assistance connecting with community support programs   [place] DSS  [phone]: Information regarding food stamps, Medicaid, and utility assistance [place] Access [place] [phone]   [place] [employer]'s shared-ride transportation service for eligible riders who have a disability that prevents them from riding the fixed route bus.   Medicare  Rights Center 800-333-4114 Helps people with Medicare understand their rights and benefits, navigate the Medicare system, and secure the quality healthcare they deserve [employer] 800-227-2345 Assists patients locate various types of support and financial assistance Cancer Care: 1-800-813-HOPE (4673) Provides financial assistance, online support groups, medication/co-pay assistance.   Transportation Assistance for appointments at [facility]: Transportation Coordinator [phone]  Again, thank you for choosing [facility] for your care.       

## 2020-04-10 NOTE — Progress Notes (Signed)
HEMATOLOGY/ONCOLOGY CONSULTATION NOTE  Date of Service: 04/10/2020  Patient Care Team: Patient, No Pcp Per as PCP - General (General Practice)  CHIEF COMPLAINTS/PURPOSE OF CONSULTATION:  Generalized LNadenopathy concerning for lymphoma  HISTORY OF PRESENTING ILLNESS:   Bryan [name] is a wonderful 64 y.o. male who has been referred to [place] by Dr. Tammi Klippel for evaluation and management of lung mass. Pt is accompanied today by his wife. The pt reports that he is doing well overall.   The pt reports that he was diagnosed with low-grade Prostate Cancer in 2013. Pt had no symptoms, but got a check up after his father was diagnosed with Prostate Cancer. He was only treated with cryoablation. He also has a reducible, right inguinal hernia. Pt denies any other medical conditions or chronic medications. He does take a daily fish oil and multivitamin. He has NKDA.   About 6 months ago pt began having abdominal pain after eating. He saw several physicians who could not explain his symptoms. Last month he began having extreme discomfort in his chest and red stools. He is unsure if the color of his stools was from blood or diet. Pt then began experiencing left arm pain and pleuritic chest pain, which triggered his Chest CT in October.  In the last few nights pt has had roaming headaches that are improved with OTC Tylenol. Pt has lost 4-5 pounds recently, but attributes this to retiring and running more. Pt is currently taking two Oxycodone twice per day, which is controlling his discomfort. His wife also notes that the pt appears more fatigued than usual and is napping more. Pt felt poorly for 1 week after his second COVID19 vaccine, which he recived in March. Pt received the [place] vaccine, but waiting to receive the booster.   Of note prior to the patient's visit today, pt has had PET/CT ([id number]) completed on 04/08/2020 with results revealing "1. Intensely hypermetabolic bilateral neck, bilateral  axillary, bilateral mediastinal, bilateral hilar and retroperitoneal lymphadenopathy. Numerous small hypermetabolic splenic masses. Findings are compatible with malignancy, favoring lymphoproliferative disorder. The most accessible pathologic lymph nodes for potential percutaneous biopsy are likely within the left axilla and left supraclavicular neck."   Most recent lab results (03/31/2020) of CBC is as follows: all values are WNL except for HCT at 41.2, MPV at 9.7, Neutro Rel at 70.3, Immature Gran Rel at 0.4, Lymphs Rel at 15.8, Mono Rel at 10.3. 03/31/2020 D-Dimer at 0.51  On review of systems, pt reports headaches, chest pain, improved diarrhea, fatigue, mild productive cough, abdominal fullness and denies fevers, chills, night sweats, unexpected weight loss, low appetite, SOB, testicular pain/swelling, leg swelling, rash and any other symptoms.   On PMHx the pt reports Prostate Cancer, Cryotherapy, Inguinal hernia. 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 On Family Hx the pt reports a father with Prostate Cancer and a mother with Esophageal and Bladder Cancer (was a smoker).   MEDICAL HISTORY:  Past Medical History:  Diagnosis Date  . Prostate cancer [facility])     SURGICAL HISTORY: Past Surgical History:  Procedure Laterality Date  . CRYOTHERAPY    . PROSTATE SURGERY      SOCIAL HISTORY: Social History   Socioeconomic History  . Marital status: Married    Spouse name: 1  . Number of children: Not on file  . Years of education: Not on file  . Highest education level: Not on file  Occupational History  . Occupation: Insurance underwriter  Employer: [employer]    Comment: medical leave  Tobacco Use  . Smoking status: Never Smoker  . Smokeless tobacco: Never Used  Vaping Use  . Vaping Use: Never used  Substance and Sexual Activity  . Alcohol use: Not Currently  . Drug use: Never  . Sexual activity: Yes  Other Topics Concern  . Not on file  Social History  Narrative  . Not on file   Social Determinants of Health   Financial Resource Strain:   . Difficulty of Paying Living Expenses: Not on file  Food Insecurity:   . Worried About Charity fundraiser in the Last Year: Not on file  . Ran Out of Food in the Last Year: Not on file  Transportation Needs:   . Lack of Transportation (Medical): Not on file  . Lack of Transportation (Non-Medical): Not on file  Physical Activity:   . Days of Exercise per Week: Not on file  . Minutes of Exercise per Session: Not on file  Stress:   . Feeling of Stress : Not on file  Social Connections:   . Frequency of Communication with Friends and Family: Not on file  . Frequency of Social Gatherings with Friends and Family: Not on file  . Attends Religious Services: Not on file  . Active Member of Clubs or Organizations: Not on file  . Attends Archivist Meetings: Not on file  . Marital Status: Not on file  Intimate Partner Violence:   . Fear of Current or Ex-Partner: Not on file  . Emotionally Abused: Not on file  . Physically Abused: Not on file  . Sexually Abused: Not on file    FAMILY HISTORY: Family History  Problem Relation Age of Onset  . Bladder Cancer Mother   . Esophageal cancer Mother   . Prostate cancer Father   . Prostate cancer Paternal Uncle     ALLERGIES:  has No Known Allergies.  MEDICATIONS:  Current Outpatient Medications  Medication Sig Dispense Refill  . acetaminophen-codeine (TYLENOL #3) 300-30 MG tablet Take by mouth every 4 (four) hours as needed for moderate pain.    . predniSONE (DELTASONE) 50 MG tablet Take 1 tablet (50 mg total) by mouth daily with breakfast. 7 tablet 0   No current facility-administered medications for this visit.   Facility-Administered Medications Ordered in Other Visits  Medication Dose Route Frequency Provider Last Rate Last Admin  . 0.9 %  sodium chloride infusion   Intravenous Continuous Monia Sabal, PA-C      . lidocaine (PF)  (XYLOCAINE) 1 % injection             REVIEW OF SYSTEMS:    10 Point review of Systems was done is negative except as noted above.  PHYSICAL EXAMINATION: ECOG PERFORMANCE STATUS: 1 - Symptomatic but completely ambulatory  . Vitals:   04/10/20 1157  BP: 99/67  Pulse: (!) 55  Resp: 18  Temp: (!) 95.4 F (35.2 C)  SpO2: 100%   Filed Weights   04/10/20 1157  Weight: 136 lb 12.8 oz (62.1 kg)   .Body mass index is 20.8 kg/m.  GENERAL:alert, in no acute distress and comfortable SKIN: no acute rashes, no significant lesions EYES: conjunctiva are pink and non-injected, sclera anicteric OROPHARYNX: MMM, no exudates, no oropharyngeal erythema or ulceration NECK: supple, no JVD LYMPH:  no palpable lymphadenopathy in the cervical, axillary or inguinal regions LUNGS: clear to auscultation b/l with normal respiratory effort HEART: regular rate & rhythm ABDOMEN:  normoactive bowel sounds , non tender, not distended. Extremity: no pedal edema PSYCH: alert & oriented x 3 with fluent speech NEURO: no focal motor/sensory deficits  LABORATORY DATA:  I have reviewed the data as listed  . CBC Latest Ref Rng & Units 04/10/2020 04/10/2020  WBC 4.0 - 10.5 K/uL 6.7 6.6  Hemoglobin 13.0 - 17.0 g/dL 13.0 13.4  Hematocrit 39 - 52 % 38.9(L) 40.4  Platelets 150 - 400 K/uL 371 393    . CMP Latest Ref Rng & Units 04/10/2020  Glucose 70 - 99 mg/dL 62(L)  BUN 8 - 23 mg/dL 13  Creatinine 0.61 - 1.24 mg/dL 0.75  Sodium 135 - 145 mmol/L 140  Potassium 3.5 - 5.1 mmol/L 3.8  Chloride 98 - 111 mmol/L 104  CO2 22 - 32 mmol/L 30  Calcium 8.9 - 10.3 mg/dL 9.1  Total Protein 6.5 - 8.1 g/dL 6.7  Total Bilirubin 0.3 - 1.2 mg/dL 0.3  Alkaline Phos 38 - 126 U/L 79  AST 15 - 41 U/L 17  ALT 0 - 44 U/L 16     RADIOGRAPHIC STUDIES: I have personally reviewed the radiological images as listed and agreed with the findings in the report. NM PET Image Initial (PI) Skull Base To Thigh  Result Date:  04/08/2020 CLINICAL DATA:  Initial treatment strategy for reported bulky mediastinal and bilateral hilar lymphadenopathy on recent outside chest CT angiogram study. History of prostate cancer in 2013. EXAM: NUCLEAR MEDICINE PET SKULL BASE TO THIGH TECHNIQUE: 6.7 mCi F-18 FDG was injected intravenously. Full-ring PET imaging was performed from the skull base to thigh after the radiotracer. CT data was obtained and used for attenuation correction and anatomic localization. Fasting blood glucose: 85 mg/dl COMPARISON:  None. FINDINGS: Mediastinal blood pool activity: SUV max 1.9 Liver activity: SUV max 2.6 NECK: Mildly to moderately enlarged hypermetabolic bilateral neck lymph nodes involving level II on the right and levels III and IV on the left. Representative 0.9 cm right level II neck node with max SUV 15.5 (series 4/image 36). Representative 1.8 cm left level IV/supraclavicular neck node with max SUV 22.6 (series 4/image 40). Incidental CT findings: none CHEST: Mildly enlarged hypermetabolic bilateral axillary lymph nodes, largest 1.6 cm on the left with max SUV 21.5 (series 4/image 55). Bulky hypermetabolic bilateral mediastinal and bilateral hilar lymph nodes. Representative 3.3 cm short axis diameter subcarinal node with max SUV 22.1 (series 4/image 76). Representative 2.1 cm AP window node with max SUV 18.8 (series 4/image 68). Representative high right paratracheal 1.2 cm node near the thoracic inlet with max SUV 20.7 (series 4/image 56). Representative 1.1 cm anterior low paraesophageal node with max SUV 10.8 (series 4/image 100). Right hilar adenopathy with max SUV 24.9 and left hilar adenopathy with max SUV 20.3. Several necrotic lymph nodes are scattered in the mediastinum and hila, for example measuring 3.4 cm in the high right mediastinum near the thoracic inlet (series 4/image 51). No hypermetabolic pulmonary findings. Incidental CT findings: Left anterior descending and right coronary atherosclerosis.  Minimally atherosclerotic nonaneurysmal thoracic aorta. No significant pulmonary nodules. ABDOMEN/PELVIS: Numerous hypermetabolic slightly hypodense masses throughout the spleen, largest 2.1 cm in the inferior spleen with max SUV 12.5 (series 4/image 120). Spleen is overall normal size. Several hypermetabolic enlarged lymph nodes throughout the retroperitoneum involving left para celiac and aortocaval chains. Representative largely necrotic high left para celiac 3.2 cm node with max SUV 10.0 (series 4/image 113). Splenic hilar 1.2 cm node with max SUV 17.6. Aortocaval 0.7 cm node with  max SUV 12.7 (series 4/image 120). No enlarged or hypermetabolic pelvic lymph nodes. No abnormal hypermetabolic activity within the liver, pancreas or adrenal glands. Incidental CT findings: Top-normal size prostate with scattered nonspecific internal prostatic calcifications. SKELETON: No focal hypermetabolic activity to suggest skeletal metastasis. Incidental CT findings: none IMPRESSION: 1. Intensely hypermetabolic bilateral neck, bilateral axillary, bilateral mediastinal, bilateral hilar and retroperitoneal lymphadenopathy. Numerous small hypermetabolic splenic masses. Findings are compatible with malignancy, favoring lymphoproliferative disorder. The most accessible pathologic lymph nodes for potential percutaneous biopsy are likely within the left axilla and left supraclavicular neck. 2. Chronic findings include: Aortic Atherosclerosis (ICD10-I70.0). Coronary atherosclerosis. Electronically Signed   By: Ilona Sorrel M.D.   On: 04/08/2020 13:58   [place] CORE BIOPSY (LYMPH NODES)  Result Date: 04/10/2020 INDICATION: Bulky adenopathy, concern for lymphoma EXAM: ULTRASOUND GUIDED CORE BIOPSY OF LEFT SUPRACLAVICULAR ADENOPATHY MEDICATIONS: 1% LIDOCAINE LOCAL ANESTHESIA/SEDATION: Versed 1.0mg  IV; Fentanyl 9mcg IV; Moderate Sedation Time:  10 MINUTES The patient was continuously monitored during the procedure by the interventional  radiology nurse under my direct supervision. FLUOROSCOPY TIME:  Fluoroscopy Time: None. COMPLICATIONS: None immediate. PROCEDURE: The procedure, risks, benefits, and alternatives were explained to the patient. Questions regarding the procedure were encouraged and answered. The patient understands and consents to the procedure. The left supraclavicular area was prepped with ChloraPrep in a sterile fashion, and a sterile drape was applied covering the operative field. A sterile gown and sterile gloves were used for the procedure. Local anesthesia was provided with 1% Lidocaine. Previous imaging reviewed. Preliminary ultrasound performed. The left supraclavicular bulky adenopathy was localized and marked. Under sterile conditions and local anesthesia, an 18 gauge core biopsy needle was advanced to the supraclavicular adenopathy. 6 18 gauge core biopsies obtained. These were intact and non fragmented. Samples placed in saline. Postprocedure imaging demonstrates no hemorrhage or hematoma. Patient tolerated biopsy well. FINDINGS: Imaging confirms needle placed into the left supraclavicular adenopathy for core biopsy IMPRESSION: Successful ultrasound left supraclavicular adenopathy 18 gauge core biopsies Electronically Signed   By: Jerilynn Mages.  Shick M.D.   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 -Discussed 04/08/2020 PET/CT ([id number]) which revealed "1. Intensely hypermetabolic bilateral neck, bilateral axillary, bilateral mediastinal, bilateral hilar and retroperitoneal lymphadenopathy. Numerous small hypermetabolic splenic masses. Findings are compatible with malignancy, favoring  lymphoproliferative disorder. The most accessible pathologic lymph nodes for potential percutaneous biopsy are likely within the left axilla and left supraclavicular neck." -Currently awaiting results of Lymph Node Bx taken today for official Dx. -Advised pt that the pattern of activity on the PET/CT suggests a lymphoma.  -Advised pt that lymph node necrosis does suggest a high-grade lymphoma. 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 -Advised pt that we don't always move to treat immediately for low-grade lymphomas, but his disease is bulky, bothersome, and threatening end organs so we would treat immediately if that diagnosis is confirmed.  -Recommend Senna or Miralax for constipation. -Recommend pt take Prednisone in the morning. -Will get labs today, including Hepatitis testing -Will get ECHO in 3-4 days -Rx Prednisone to help with some of his respiratory/chest symptoms -Will see back in 5 days   FOLLOW UP: Labs today ECHO In 3-4  days RTC with Dr Irene Limbo on 11/9 @ 2:40pm  . Orders Placed This Encounter  Procedures  . CBC with Differential/Platelet    Standing Status:   Future    Number of Occurrences:   1    Standing Expiration Date:   04/10/2021  . CMP ([place] only)    Standing Status:   Future    Number of Occurrences:   1    Standing Expiration Date:   04/10/2021  . Lactate dehydrogenase    Standing Status:   Future    Number of Occurrences:   1    Standing Expiration Date:   04/10/2021  . Uric acid    Standing Status:   Future    Number of Occurrences:   1    Standing Expiration Date:   04/10/2021  . Hepatitis C antibody    Standing Status:   Future    Number of Occurrences:   1    Standing Expiration Date:   04/10/2021  . Hepatitis B core antibody, total    Standing Status:   Future    Number of Occurrences:   1    Standing Expiration Date:   04/10/2021  . Hepatitis B surface antigen    Standing Status:   Future    Number of Occurrences:   1     Standing Expiration Date:   04/10/2021  . HIV Antibody (routine testing w rflx)    Standing Status:   Future    Number of Occurrences:   1    Standing Expiration Date:   04/10/2021  . ECHOCARDIOGRAM COMPLETE    Standing Status:   Future    Standing Expiration Date:   04/10/2021    Order Specific Question:   Where should this test be performed    Answer:   [facility]    Order Specific Question:   Perflutren DEFINITY (image enhancing agent) should be administered unless hypersensitivity or allergy exist    Answer:   Administer Perflutren    Order Specific Question:   Reason for exam-Echo    Answer:   Chemotherapy evaluation  v87.41 / v58.11  . ABO/RH    Standing Status:   Future    Number of Occurrences:   1    Standing Expiration Date:   04/10/2021     All of the patients questions were answered with apparent satisfaction. The patient knows to call the clinic with any problems, questions or concerns.  I spent 45 mins counseling the patient face to face. The total time spent in the appointment was 60 minutes and more than 50% was on counseling and direct patient cares.    Sullivan Lone MD Marissa AAHIVMS [facility] Hematology/Oncology Physician [facility]  (Office):       [phone] (Work cell):  [phone] (Fax):           [phone]  04/10/2020 3:59 PM  I, Yevette Edwards, am acting as a scribe for Dr. Sullivan Lone.   .I have reviewed the above documentation for accuracy and completeness, and I agree with the above. Brunetta Genera MD

## 2020-04-14 LAB — SURGICAL PATHOLOGY

## 2020-04-15 ENCOUNTER — Inpatient Hospital Stay (HOSPITAL_BASED_OUTPATIENT_CLINIC_OR_DEPARTMENT_OTHER): Payer: 59 | Admitting: Hematology

## 2020-04-15 ENCOUNTER — Other Ambulatory Visit: Payer: Self-pay

## 2020-04-15 VITALS — BP 95/62 | HR 63 | Temp 97.8°F | Resp 17 | Ht 68.0 in | Wt 137.1 lb

## 2020-04-15 DIAGNOSIS — C8598 Non-Hodgkin lymphoma, unspecified, lymph nodes of multiple sites: Secondary | ICD-10-CM | POA: Diagnosis not present

## 2020-04-15 DIAGNOSIS — C8338 Diffuse large B-cell lymphoma, lymph nodes of multiple sites: Secondary | ICD-10-CM

## 2020-04-15 DIAGNOSIS — Z7189 Other specified counseling: Secondary | ICD-10-CM | POA: Diagnosis not present

## 2020-04-15 NOTE — Progress Notes (Signed)
HEMATOLOGY/ONCOLOGY CONSULTATION NOTE  Date of Service: 04/15/2020  Patient Care Team: Patient, No Pcp Per as PCP - General (General Practice)  CHIEF COMPLAINTS/PURPOSE OF CONSULTATION:  Lung mass  HISTORY OF PRESENTING ILLNESS:  Bryan [name] is a wonderful 64 y.o. male who has been referred to [place] by Dr. Tammi Klippel for evaluation and management of lung mass. Pt is accompanied today by his wife. The pt reports that he is doing well overall.   The pt reports that he was diagnosed with low-grade Prostate Cancer in 2013. Pt had no symptoms, but got a check up after his father was diagnosed with Prostate Cancer. He was only treated with cryoablation. He also has a reducible, right inguinal hernia. Pt denies any other medical conditions or chronic medications. He does take a daily fish oil and multivitamin. He has NKDA.   About 6 months ago pt began having abdominal pain after eating. He saw several physicians who could not explain his symptoms. Last month he began having extreme discomfort in his chest and red stools. He is unsure if the color of his stools was from blood or diet. Pt then began experiencing left arm pain and pleuritic chest pain, which triggered his Chest CT in October.  In the last few nights pt has had roaming headaches that are improved with OTC Tylenol. Pt has lost 4-5 pounds recently, but attributes this to retiring and running more. Pt is currently taking two Oxycodone twice per day, which is controlling his discomfort. His wife also notes that the pt appears more fatigued than usual and is napping more. Pt felt poorly for 1 week after his second COVID19 vaccine, which he recived in March. Pt received the [place] vaccine, but waiting to receive the booster.   Of note prior to the patient's visit today, pt has had PET/CT ([id number]) completed on 04/08/2020 with results revealing "1. Intensely hypermetabolic bilateral neck, bilateral axillary, bilateral mediastinal, bilateral  hilar and retroperitoneal lymphadenopathy. Numerous small hypermetabolic splenic masses. Findings are compatible with malignancy, favoring lymphoproliferative disorder. The most accessible pathologic lymph nodes for potential percutaneous biopsy are likely within the left axilla and left supraclavicular neck."   Most recent lab results (03/31/2020) of CBC is as follows: all values are WNL except for HCT at 41.2, MPV at 9.7, Neutro Rel at 70.3, Immature Gran Rel at 0.4, Lymphs Rel at 15.8, Mono Rel at 10.3. 03/31/2020 D-Dimer at 0.51  On review of systems, pt reports headaches, chest pain, improved diarrhea, fatigue, mild productive cough, abdominal fullness and denies fevers, chills, night sweats, unexpected weight loss, low appetite, SOB, testicular pain/swelling, leg swelling, rash and any other symptoms.   On PMHx the pt reports Prostate Cancer, Cryotherapy, Inguinal hernia. 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 On Family Hx the pt reports a father with Prostate Cancer and a mother with Esophageal and Bladder Cancer (was a smoker).   INTERVAL HISTORY:  Bryan [name] is a wonderful 64 y.o. male who has been referred to [place] by Dr. Tammi Klippel for evaluation and management of lung mass. The patient's last visit with [place] was on 04/10/2020. The pt reports that he is doing well overall.  The pt reports that he has been experiencing headaches and body aches, which are worse since our last visit. His headaches are intermittent, but are worse in the morning and are non-localized. His pain is controlled with one Tylenol at a time, but his total daily needs have increased recently. Pt  has been able to take Prednisone as prescribed and has some relief of his chest pressure/discomfort. He does note new wheezing. Pt is eating well.   Pt notes that he had flu-like symptoms for a week after his second COVID19 vaccine.   Of note since the patient's last visit, pt has had Left Cervical Lymph  Node Bx ([id number]) completed on 04/10/2020 with results revealing "Diffuse large B-cell lymphoma."  Lab results (04/10/2020) of CBC w/diff and CMP is as follows: all values are WNL except for HCT at 38.9, Glucose at 62, Albumin at 3.4. 04/10/2020 LDH at 200 04/10/2020 Uric acid at 6.3 04/10/2020 HCV Ab is Non Reactive 04/10/2020 HIV Screen is Non Reactive 04/10/2020 Hep B Core Total Ab is Non Reactive 04/10/2020 Hepatitis B Surface Ag is Non Reactive  On review of systems, pt reports worsening headache, body aches, wheezing, improving chest pressure and denies low appetite and any other symptoms.   MEDICAL HISTORY:  Past Medical History:  Diagnosis Date  . Prostate cancer [facility])     SURGICAL HISTORY: Past Surgical History:  Procedure Laterality Date  . CRYOTHERAPY    . PROSTATE SURGERY      SOCIAL HISTORY: Social History   Socioeconomic History  . Marital status: Married    Spouse name: 1  . Number of children: Not on file  . Years of education: Not on file  . Highest education level: Not on file  Occupational History  . Occupation: Futures trader: [employer]    Comment: medical leave  Tobacco Use  . Smoking status: Never Smoker  . Smokeless tobacco: Never Used  Vaping Use  . Vaping Use: Never used  Substance and Sexual Activity  . Alcohol use: Not Currently  . Drug use: Never  . Sexual activity: Yes  Other Topics Concern  . Not on file  Social History Narrative  . Not on file   Social Determinants of Health   Financial Resource Strain:   . Difficulty of Paying Living Expenses: Not on file  Food Insecurity:   . Worried About Charity fundraiser in the Last Year: Not on file  . Ran Out of Food in the Last Year: Not on file  Transportation Needs:   . Lack of Transportation (Medical): Not on file  . Lack of Transportation (Non-Medical): Not on file  Physical Activity:   . Days of Exercise per Week: Not on file  . Minutes of Exercise per Session:  Not on file  Stress:   . Feeling of Stress : Not on file  Social Connections:   . Frequency of Communication with Friends and Family: Not on file  . Frequency of Social Gatherings with Friends and Family: Not on file  . Attends Religious Services: Not on file  . Active Member of Clubs or Organizations: Not on file  . Attends Archivist Meetings: Not on file  . Marital Status: Not on file  Intimate Partner Violence:   . Fear of Current or Ex-Partner: Not on file  . Emotionally Abused: Not on file  . Physically Abused: Not on file  . Sexually Abused: Not on file    FAMILY HISTORY: Family History  Problem Relation Age of Onset  . Bladder Cancer Mother   . Esophageal cancer Mother   . Prostate cancer Father   . Prostate cancer Paternal Uncle     ALLERGIES:  has No Known Allergies.  MEDICATIONS:  Current Outpatient Medications  Medication Sig Dispense Refill  .  acetaminophen-codeine (TYLENOL #3) 300-30 MG tablet Take by mouth every 4 (four) hours as needed for moderate pain.    . predniSONE (DELTASONE) 50 MG tablet Take 1 tablet (50 mg total) by mouth daily with breakfast. 7 tablet 0   No current facility-administered medications for this visit.    REVIEW OF SYSTEMS:   A 10+ POINT REVIEW OF SYSTEMS WAS OBTAINED including neurology, dermatology, psychiatry, cardiac, respiratory, lymph, extremities, GI, GU, Musculoskeletal, constitutional, breasts, reproductive, HEENT.  All pertinent positives are noted in the HPI.  All others are negative.   PHYSICAL EXAMINATION: ECOG PERFORMANCE STATUS: 1 - Symptomatic but completely ambulatory  . Vitals:   04/15/20 1449  BP: 95/62  Pulse: 63  Resp: 17  Temp: 97.8 F (36.6 C)  SpO2: 98%   Filed Weights   04/15/20 1449  Weight: 137 lb 1.6 oz (62.2 kg)   .Body mass index is 20.85 kg/m.   GENERAL:alert, in no acute distress and comfortable SKIN: no acute rashes, no significant lesions EYES: conjunctiva are pink and  non-injected, sclera anicteric OROPHARYNX: MMM, no exudates, no oropharyngeal erythema or ulceration NECK: supple, no JVD LYMPH:  no palpable lymphadenopathy in the cervical, axillary or inguinal regions LUNGS: clear to auscultation b/l with normal respiratory effort HEART: regular rate & rhythm ABDOMEN:  normoactive bowel sounds , non tender, not distended. No palpable hepatosplenomegaly.  Extremity: no pedal edema PSYCH: alert & oriented x 3 with fluent speech NEURO: no focal motor/sensory deficits  LABORATORY DATA:  I have reviewed the data as listed  . CBC Latest Ref Rng & Units 04/21/2020 04/10/2020 04/10/2020  WBC 4.0 - 10.5 K/uL 14.0(H) 6.7 6.6  Hemoglobin 13.0 - 17.0 g/dL 13.3 13.0 13.4  Hematocrit 39 - 52 % 39.4 38.9(L) 40.4  Platelets 150 - 400 K/uL 278 371 393   . CMP Latest Ref Rng & Units 04/21/2020 04/10/2020  Glucose 70 - 99 mg/dL 119(H) 62(L)  BUN 8 - 23 mg/dL 14 13  Creatinine 0.61 - 1.24 mg/dL 0.65 0.75  Sodium 135 - 145 mmol/L 131(L) 140  Potassium 3.5 - 5.1 mmol/L 4.0 3.8  Chloride 98 - 111 mmol/L 96(L) 104  CO2 22 - 32 mmol/L 27 30  Calcium 8.9 - 10.3 mg/dL 8.9 9.1  Total Protein 6.5 - 8.1 g/dL 6.6 6.7  Total Bilirubin 0.3 - 1.2 mg/dL 0.6 0.3  Alkaline Phos 38 - 126 U/L 68 79  AST 15 - 41 U/L 21 17  ALT 0 - 44 U/L 22 16   04/10/2020 Left Cervical Lymph Node Bx ([id number]):    RADIOGRAPHIC STUDIES: I have personally reviewed the radiological images as listed and agreed with the findings in the report. NM PET Image Initial (PI) Skull Base To Thigh  Result Date: 04/08/2020 CLINICAL DATA:  Initial treatment strategy for reported bulky mediastinal and bilateral hilar lymphadenopathy on recent outside chest CT angiogram study. History of prostate cancer in 2013. EXAM: NUCLEAR MEDICINE PET SKULL BASE TO THIGH TECHNIQUE: 6.7 mCi F-18 FDG was injected intravenously. Full-ring PET imaging was performed from the skull base to thigh after the radiotracer. CT data  was obtained and used for attenuation correction and anatomic localization. Fasting blood glucose: 85 mg/dl COMPARISON:  None. FINDINGS: Mediastinal blood pool activity: SUV max 1.9 Liver activity: SUV max 2.6 NECK: Mildly to moderately enlarged hypermetabolic bilateral neck lymph nodes involving level II on the right and levels III and IV on the left. Representative 0.9 cm right level II neck node with max SUV 15.5 (  series 4/image 36). Representative 1.8 cm left level IV/supraclavicular neck node with max SUV 22.6 (series 4/image 40). Incidental CT findings: none CHEST: Mildly enlarged hypermetabolic bilateral axillary lymph nodes, largest 1.6 cm on the left with max SUV 21.5 (series 4/image 55). Bulky hypermetabolic bilateral mediastinal and bilateral hilar lymph nodes. Representative 3.3 cm short axis diameter subcarinal node with max SUV 22.1 (series 4/image 76). Representative 2.1 cm AP window node with max SUV 18.8 (series 4/image 68). Representative high right paratracheal 1.2 cm node near the thoracic inlet with max SUV 20.7 (series 4/image 56). Representative 1.1 cm anterior low paraesophageal node with max SUV 10.8 (series 4/image 100). Right hilar adenopathy with max SUV 24.9 and left hilar adenopathy with max SUV 20.3. Several necrotic lymph nodes are scattered in the mediastinum and hila, for example measuring 3.4 cm in the high right mediastinum near the thoracic inlet (series 4/image 51). No hypermetabolic pulmonary findings. Incidental CT findings: Left anterior descending and right coronary atherosclerosis. Minimally atherosclerotic nonaneurysmal thoracic aorta. No significant pulmonary nodules. ABDOMEN/PELVIS: Numerous hypermetabolic slightly hypodense masses throughout the spleen, largest 2.1 cm in the inferior spleen with max SUV 12.5 (series 4/image 120). Spleen is overall normal size. Several hypermetabolic enlarged lymph nodes throughout the retroperitoneum involving left para celiac and  aortocaval chains. Representative largely necrotic high left para celiac 3.2 cm node with max SUV 10.0 (series 4/image 113). Splenic hilar 1.2 cm node with max SUV 17.6. Aortocaval 0.7 cm node with max SUV 12.7 (series 4/image 120). No enlarged or hypermetabolic pelvic lymph nodes. No abnormal hypermetabolic activity within the liver, pancreas or adrenal glands. Incidental CT findings: Top-normal size prostate with scattered nonspecific internal prostatic calcifications. SKELETON: No focal hypermetabolic activity to suggest skeletal metastasis. Incidental CT findings: none IMPRESSION: 1. Intensely hypermetabolic bilateral neck, bilateral axillary, bilateral mediastinal, bilateral hilar and retroperitoneal lymphadenopathy. Numerous small hypermetabolic splenic masses. Findings are compatible with malignancy, favoring lymphoproliferative disorder. The most accessible pathologic lymph nodes for potential percutaneous biopsy are likely within the left axilla and left supraclavicular neck. 2. Chronic findings include: Aortic Atherosclerosis (ICD10-I70.0). Coronary atherosclerosis. Electronically Signed   By: Ilona Sorrel M.D.   On: 04/08/2020 13:58   [place] CORE BIOPSY (LYMPH NODES)  Result Date: 04/10/2020 INDICATION: Bulky adenopathy, concern for lymphoma EXAM: ULTRASOUND GUIDED CORE BIOPSY OF LEFT SUPRACLAVICULAR ADENOPATHY MEDICATIONS: 1% LIDOCAINE LOCAL ANESTHESIA/SEDATION: Versed 1.37m IV; Fentanyl 526m IV; Moderate Sedation Time:  10 MINUTES The patient was continuously monitored during the procedure by the interventional radiology nurse under my direct supervision. FLUOROSCOPY TIME:  Fluoroscopy Time: None. COMPLICATIONS: None immediate. PROCEDURE: The procedure, risks, benefits, and alternatives were explained to the patient. Questions regarding the procedure were encouraged and answered. The patient understands and consents to the procedure. The left supraclavicular area was prepped with ChloraPrep in a  sterile fashion, and a sterile drape was applied covering the operative field. A sterile gown and sterile gloves were used for the procedure. Local anesthesia was provided with 1% Lidocaine. Previous imaging reviewed. Preliminary ultrasound performed. The left supraclavicular bulky adenopathy was localized and marked. Under sterile conditions and local anesthesia, an 18 gauge core biopsy needle was advanced to the supraclavicular adenopathy. 6 18 gauge core biopsies obtained. These were intact and non fragmented. Samples placed in saline. Postprocedure imaging demonstrates no hemorrhage or hematoma. Patient tolerated biopsy well. FINDINGS: Imaging confirms needle placed into the left supraclavicular adenopathy for core biopsy IMPRESSION: Successful ultrasound left supraclavicular adenopathy 18 gauge core biopsies Electronically Signed   By: M.Jerilynn Mages  Shick M.D.   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 Intensely hypermetabolic bilateral neck, bilateral axillary, bilateral mediastinal, bilateral hilar and retroperitoneal lymphadenopathy. Numerous small hypermetabolic splenic masses. Findings are compatible with malignancy, favoring lymphoproliferative disorder."  PLAN: -Discussed pt labwork, 04/10/2020; WBC, PLT, & Hgb are nml, blood chemistries are okay, Uric acid is WNL, LDH is borderline elevated, Hepatitis B, Hepatitis C, and HIV screening is negative. -Discussed 04/10/2020 Left Cervical Lymph Node Bx ([id number]) which revealed "Diffuse large B-cell lymphoma. KI-67 is 80-90%" -Advised pt that he has a moderate Non-Hodgkin's Lymphoma and that we plan to treat to cure.  -Advised pt that standard of care for average DLBCL would be outpatient R-CHOP (once every 3 weeks).  -Advised pt that due to his aggressive, Stage 3 diease we would plan to complete inpatient R-CHOP to  reduce tumor repopulation.  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  -Advised pt that we would repeat a PET/CT after C3.   -Advised pt that treatment, in addition to his lymphoma, will increase his risk of infections.  -Plan to give pt COVID19 booster after C1. -Will get Brain MRI and if needed lumbar puncture -Will set pt up for chemo-counseling ASAP -Will get Port-a-cath placement ASAP -Will get ECHO for baseline  -Plan to begin inpatient EPOCH-R on 04/21/20    FOLLOW UP: ECHO ASAP Port a cath placement ASAP MRI brain w and wo contrast stat -urgent chemo-counseling for EPOCH-R and udenyca Admit for inpatient EPOCH-R chemotherapy for 5 days from 04/21/2020 Outpatient RItuxan and udenyca on 11/22 RTC with Dr Irene Limbo with labs on 11/29 for toxicity check   The total time spent in the appt was 60 minutes and more than 50% was on counseling and direct patient cares.  All of the patient's questions were answered with apparent satisfaction. The patient knows to call the clinic with any problems, questions or concerns.    Sullivan Lone MD [place] AAHIVMS [facility] Hematology/Oncology Physician [facility]  (Office):       [phone] (Work cell):  [phone] (Fax):           [phone]  04/15/2020 9:55 AM  I, Yevette Edwards, am acting as a scribe for Dr. Sullivan Lone.   .I have reviewed the above documentation for accuracy and completeness, and I agree with the above. Brunetta Genera MD

## 2020-04-16 DIAGNOSIS — C8330 Diffuse large B-cell lymphoma, unspecified site: Secondary | ICD-10-CM | POA: Insufficient documentation

## 2020-04-16 DIAGNOSIS — Z7189 Other specified counseling: Secondary | ICD-10-CM | POA: Insufficient documentation

## 2020-04-16 NOTE — Progress Notes (Signed)
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 

## 2020-04-17 ENCOUNTER — Telehealth: Payer: Self-pay | Admitting: *Deleted

## 2020-04-17 NOTE — Telephone Encounter (Addendum)
Scheduled for Monday 11/15 AM admission per Maudie Mercury in patient placement. Chemo Education is scheduled for Friday afternoon and covid screen is scheduled for Saturday.  Patient is scheduled in [facility] Monday AM at 0800 as outpatient for placement of PICC line.    Patient/wife informed of all times and locations and verbalized understanding

## 2020-04-18 ENCOUNTER — Inpatient Hospital Stay: Payer: 59

## 2020-04-18 ENCOUNTER — Ambulatory Visit (HOSPITAL_COMMUNITY)
Admission: RE | Admit: 2020-04-18 | Discharge: 2020-04-18 | Disposition: A | Discharge status: Home / Self Care | Payer: 59 | Source: Ambulatory Visit | Attending: Hematology | Admitting: Hematology

## 2020-04-18 ENCOUNTER — Other Ambulatory Visit: Payer: Self-pay

## 2020-04-18 ENCOUNTER — Other Ambulatory Visit (HOSPITAL_COMMUNITY)
Admission: RE | Admit: 2020-04-18 | Discharge: 2020-04-18 | Disposition: A | Discharge status: Home / Self Care | Payer: 59 | Source: Ambulatory Visit | Attending: Hematology | Admitting: Hematology

## 2020-04-18 DIAGNOSIS — Z20822 Contact with and (suspected) exposure to covid-19: Secondary | ICD-10-CM | POA: Insufficient documentation

## 2020-04-18 DIAGNOSIS — C8338 Diffuse large B-cell lymphoma, lymph nodes of multiple sites: Secondary | ICD-10-CM | POA: Insufficient documentation

## 2020-04-18 DIAGNOSIS — Z01812 Encounter for preprocedural laboratory examination: Secondary | ICD-10-CM | POA: Insufficient documentation

## 2020-04-18 DIAGNOSIS — C8598 Non-Hodgkin lymphoma, unspecified, lymph nodes of multiple sites: Secondary | ICD-10-CM | POA: Insufficient documentation

## 2020-04-18 LAB — SARS CORONAVIRUS 2 (TAT 6-24 HRS): SARS Coronavirus 2: NEGATIVE

## 2020-04-18 MED ORDER — GADOBUTROL 1 MMOL/ML IV SOLN
6.0000 mL | Freq: Once | INTRAVENOUS | Status: AC | PRN
  Administered 2020-04-18: 6 mL via INTRAVENOUS

## 2020-04-19 ENCOUNTER — Other Ambulatory Visit (HOSPITAL_COMMUNITY): Payer: 59

## 2020-04-21 ENCOUNTER — Encounter (HOSPITAL_COMMUNITY): Payer: Self-pay | Admitting: Hematology

## 2020-04-21 ENCOUNTER — Other Ambulatory Visit: Payer: Self-pay

## 2020-04-21 ENCOUNTER — Ambulatory Visit (HOSPITAL_COMMUNITY)
Admission: RE | Admit: 2020-04-21 | Discharge: 2020-04-21 | Disposition: A | Discharge status: Home / Self Care | Payer: 59 | Source: Ambulatory Visit | Attending: Hematology | Admitting: Hematology

## 2020-04-21 ENCOUNTER — Inpatient Hospital Stay (HOSPITAL_COMMUNITY): Payer: 59

## 2020-04-21 ENCOUNTER — Other Ambulatory Visit: Payer: Self-pay | Admitting: Hematology

## 2020-04-21 ENCOUNTER — Inpatient Hospital Stay (HOSPITAL_COMMUNITY)
Admission: RE | Admit: 2020-04-21 | Discharge: 2020-04-25 | DRG: 847 | Disposition: A | Discharge status: Home / Self Care | Payer: 59 | Source: Ambulatory Visit | Attending: Hematology | Admitting: Hematology

## 2020-04-21 DIAGNOSIS — K5900 Constipation, unspecified: Secondary | ICD-10-CM | POA: Diagnosis present

## 2020-04-21 DIAGNOSIS — C8338 Diffuse large B-cell lymphoma, lymph nodes of multiple sites: Secondary | ICD-10-CM | POA: Diagnosis present

## 2020-04-21 DIAGNOSIS — Z0189 Encounter for other specified special examinations: Secondary | ICD-10-CM | POA: Diagnosis not present

## 2020-04-21 DIAGNOSIS — R110 Nausea: Secondary | ICD-10-CM

## 2020-04-21 DIAGNOSIS — C8598 Non-Hodgkin lymphoma, unspecified, lymph nodes of multiple sites: Secondary | ICD-10-CM | POA: Diagnosis not present

## 2020-04-21 DIAGNOSIS — Z20822 Contact with and (suspected) exposure to covid-19: Secondary | ICD-10-CM | POA: Diagnosis present

## 2020-04-21 DIAGNOSIS — C8330 Diffuse large B-cell lymphoma, unspecified site: Secondary | ICD-10-CM | POA: Diagnosis present

## 2020-04-21 DIAGNOSIS — Z7189 Other specified counseling: Secondary | ICD-10-CM

## 2020-04-21 DIAGNOSIS — Z5111 Encounter for antineoplastic chemotherapy: Principal | ICD-10-CM

## 2020-04-21 LAB — CBC WITH DIFFERENTIAL/PLATELET
Abs Immature Granulocytes: 0.13 10*3/uL — ABNORMAL HIGH (ref 0.00–0.07)
Basophils Absolute: 0.1 10*3/uL (ref 0.0–0.1)
Basophils Relative: 1 %
Eosinophils Absolute: 0.3 10*3/uL (ref 0.0–0.5)
Eosinophils Relative: 2 %
HCT: 39.4 % (ref 39.0–52.0)
Hemoglobin: 13.3 g/dL (ref 13.0–17.0)
Immature Granulocytes: 1 %
Lymphocytes Relative: 8 %
Lymphs Abs: 1.1 10*3/uL (ref 0.7–4.0)
MCH: 30.2 pg (ref 26.0–34.0)
MCHC: 33.8 g/dL (ref 30.0–36.0)
MCV: 89.3 fL (ref 80.0–100.0)
Monocytes Absolute: 1.6 10*3/uL — ABNORMAL HIGH (ref 0.1–1.0)
Monocytes Relative: 11 %
Neutro Abs: 10.8 10*3/uL — ABNORMAL HIGH (ref 1.7–7.7)
Neutrophils Relative %: 77 %
Platelets: 278 10*3/uL (ref 150–400)
RBC: 4.41 MIL/uL (ref 4.22–5.81)
RDW: 11.9 % (ref 11.5–15.5)
WBC: 14 10*3/uL — ABNORMAL HIGH (ref 4.0–10.5)
nRBC: 0 % (ref 0.0–0.2)

## 2020-04-21 LAB — COMPREHENSIVE METABOLIC PANEL
ALT: 22 U/L (ref 0–44)
AST: 21 U/L (ref 15–41)
Albumin: 3.5 g/dL (ref 3.5–5.0)
Alkaline Phosphatase: 68 U/L (ref 38–126)
Anion gap: 8 (ref 5–15)
BUN: 14 mg/dL (ref 8–23)
CO2: 27 mmol/L (ref 22–32)
Calcium: 8.9 mg/dL (ref 8.9–10.3)
Chloride: 96 mmol/L — ABNORMAL LOW (ref 98–111)
Creatinine, Ser: 0.65 mg/dL (ref 0.61–1.24)
GFR, Estimated: >60 mL/min (ref >60)
Glucose, Bld: 119 mg/dL — ABNORMAL HIGH (ref 70–99)
Potassium: 4 mmol/L (ref 3.5–5.1)
Sodium: 131 mmol/L — ABNORMAL LOW (ref 135–145)
Total Bilirubin: 0.6 mg/dL (ref 0.3–1.2)
Total Protein: 6.6 g/dL (ref 6.5–8.1)

## 2020-04-21 LAB — LACTATE DEHYDROGENASE: LDH: 193 U/L — ABNORMAL HIGH (ref 98–192)

## 2020-04-21 LAB — ECHOCARDIOGRAM COMPLETE

## 2020-04-21 LAB — URIC ACID: Uric Acid, Serum: 5.9 mg/dL (ref 3.7–8.6)

## 2020-04-21 MED ORDER — LIDOCAINE HCL 1 % IJ SOLN
INTRAMUSCULAR | Status: AC
  Administered 2020-04-21: 3 mL
  Filled 2020-04-21: qty 20

## 2020-04-21 MED ORDER — PEGFILGRASTIM-CBQV 6 MG/0.6ML ~~LOC~~ SOSY: 6.0000 mg | PREFILLED_SYRINGE | Freq: Once | SUBCUTANEOUS | 5 refills | Status: AC

## 2020-04-21 MED ORDER — SODIUM CHLORIDE 0.9% FLUSH: 10.0000 mL | INTRAVENOUS | Status: DC | PRN

## 2020-04-21 MED ORDER — VINCRISTINE SULFATE CHEMO INJECTION 1 MG/ML
Freq: Once | INTRAVENOUS | Status: AC
  Filled 2020-04-21: qty 9

## 2020-04-21 MED ORDER — IOHEXOL 300 MG/ML  SOLN
50.0000 mL | Freq: Once | INTRAMUSCULAR | Status: AC | PRN
  Administered 2020-04-21: 5 mL

## 2020-04-21 MED ORDER — SODIUM CHLORIDE 0.9 % IV SOLN
Freq: Once | INTRAVENOUS | Status: AC
  Administered 2020-04-21: 18 mg via INTRAVENOUS
  Filled 2020-04-21: qty 4

## 2020-04-21 MED ORDER — PREDNISONE 20 MG PO TABS
60.0000 mg | ORAL_TABLET | Freq: Every day | ORAL | Status: AC
  Administered 2020-04-21 – 2020-04-25 (×5): 60 mg via ORAL
  Filled 2020-04-21 (×6): qty 3

## 2020-04-21 MED ORDER — ACETAMINOPHEN-CODEINE #3 300-30 MG PO TABS
1.0000 | ORAL_TABLET | ORAL | Status: DC | PRN
  Administered 2020-04-21 – 2020-04-25 (×7): 1 via ORAL
  Filled 2020-04-21 (×7): qty 1

## 2020-04-21 MED ORDER — PROCHLORPERAZINE MALEATE 10 MG PO TABS
10.0000 mg | ORAL_TABLET | Freq: Four times a day (QID) | ORAL | Status: DC | PRN
  Administered 2020-04-22 – 2020-04-24 (×2): 10 mg via ORAL
  Filled 2020-04-21 (×2): qty 1

## 2020-04-21 MED ORDER — POLYETHYLENE GLYCOL 3350 17 G PO PACK
17.0000 g | PACK | Freq: Every day | ORAL | Status: DC | PRN
  Administered 2020-04-22: 17 g via ORAL
  Filled 2020-04-21: qty 1

## 2020-04-21 MED ORDER — CHLORHEXIDINE GLUCONATE CLOTH 2 % EX PADS
6.0000 | MEDICATED_PAD | Freq: Every day | CUTANEOUS | Status: DC
  Administered 2020-04-21 – 2020-04-25 (×5): 6 via TOPICAL

## 2020-04-21 MED ORDER — SODIUM CHLORIDE 0.9% FLUSH
10.0000 mL | Freq: Two times a day (BID) | INTRAVENOUS | Status: DC
  Administered 2020-04-21: 20 mL
  Administered 2020-04-24: 10 mL
  Administered 2020-04-25 (×2): 20 mL

## 2020-04-21 MED ORDER — ALLOPURINOL 100 MG PO TABS
100.0000 mg | ORAL_TABLET | Freq: Two times a day (BID) | ORAL | Status: DC
  Administered 2020-04-21 – 2020-04-25 (×9): 100 mg via ORAL
  Filled 2020-04-21 (×9): qty 1

## 2020-04-21 MED ORDER — SODIUM CHLORIDE 0.9 % IV SOLN: INTRAVENOUS | Status: DC

## 2020-04-21 NOTE — Procedures (Signed)
Pre procedural Diagnosis: Poor venous access Post Procedural Diagnosis: Same  Successful placement of right brachial vein approach dual lumen PICC line with tip at the superior caval-atrial junction.    EBL: None  No immediate post procedural complication.  The PICC line is ready for immediate use.  Ronny Bacon, MD Pager #: [phone]

## 2020-04-21 NOTE — H&P (Addendum)
[place]  [phone] Fax:([phone]) [place] H&P  Reason for Admission: Cycle #1 EPOCH-R  HPI: Bryan [name] is a wonderful 64 y.o. male who has been referred to [place] by Dr. Tammi Klippel for evaluation and management of lung mass.  About 6 months ago pt began having abdominal pain after eating. He saw several physicians who could not explain his symptoms.  He then developed extreme discomfort in his chest and red stools. He is unsure if the color of his stools was from blood or diet. Pt then began experiencing left arm pain and pleuritic chest pain, which triggered his Chest CTA in October.  CTA chest ruled out PE but showed bulky mediastinal and bilateral hilar adenopathy.  He was referred to hematology oncology at [facility] and a PET scan and biopsy were ordered.  There were significant delays in getting these tests completed.  Bryan [name] then establish care here in [place], [place] with hopes of expediting his work-up.  He underwent a PET scan on 04/08/2020 which showed intensely hypermetabolic bilateral neck, bilateral axillary, bilateral mediastinal, bilateral hilar, and retroperitoneal lymphadenopathy, numerous small hypermetabolic splenic masses.  He then had an ultrasound-guided biopsy of a left supraclavicular lymph node which showed diffuse large B-cell lymphoma.  MRI of the brain with and without contrast was performed on 04/18/2020 which showed no acute intracranial process and no evidence for metastatic disease.  It was recommended for the patient to begin systemic chemotherapy with EPOCH-R.   The patient is seen today for admission prior to cycle number one of his chemotherapy.  His wife is at the bedside.  He reports ongoing fatigue and arthralgias.  He uses Tylenol #3 for his pain.  He is not having any fevers but reports chills.  He denies chest pain and shortness of breath.  Denies abdominal pain but reports mild nausea without  vomiting.  No change in his bowels noted.  The patient had a PICC line placed by IR this morning for chemotherapy administration.  Port-A-Cath will be placed at a later date prior to cycle #2.  Echocardiogram not yet performed.  The patient is seen today for admission for cycle number one of his chemotherapy.   Past Medical History:  Diagnosis Date   Prostate cancer ([place])   :  Past Surgical History:  Procedure Laterality Date   CRYOTHERAPY     PROSTATE SURGERY    :  Current Facility-Administered Medications  Medication Dose Route Frequency Provider Last Rate Last Admin   allopurinol (ZYLOPRIM) tablet 100 mg  100 mg Oral BID Curcio, Kristin R, NP       prochlorperazine (COMPAZINE) tablet 10 mg  10 mg Oral Q6H PRN Curcio, Roselie Awkward, NP         No Known Allergies:  Family History  Problem Relation Age of Onset   Bladder Cancer Mother    Esophageal cancer Mother    Prostate cancer Father    Prostate cancer Paternal Uncle   :  Social History   Socioeconomic History   Marital status: Married    Spouse name: 1   Number of children: Not on file   Years of education: Not on file   Highest education level: Not on file  Occupational History   Occupation: Futures trader: [employer]    Comment: medical leave  Tobacco Use   Smoking status: Never Smoker   Smokeless tobacco: Never Used  Scientific laboratory technician Use:  Never used  Substance and Sexual Activity   Alcohol use: Not Currently   Drug use: Never   Sexual activity: Yes  Other Topics Concern   Not on file  Social History Narrative   Not on file   Social Determinants of Health   Financial Resource Strain:    Difficulty of Paying Living Expenses: Not on file  Food Insecurity:    Worried About [place] in the Last Year: Not on file   Ran Out of Food in the Last Year: Not on file  Transportation Needs:    Lack of Transportation (Medical): Not on file   Lack of Transportation  (Non-Medical): Not on file  Physical Activity:    Days of Exercise per Week: Not on file   Minutes of Exercise per Session: Not on file  Stress:    Feeling of Stress : Not on file  Social Connections:    Frequency of Communication with Friends and Family: Not on file   Frequency of Social Gatherings with Friends and Family: Not on file   Attends Religious Services: Not on file   Active Member of Clubs or Organizations: Not on file   Attends Archivist Meetings: Not on file   Marital Status: Not on file  Intimate Partner Violence:    Fear of Current or Ex-Partner: Not on file   Emotionally Abused: Not on file   Physically Abused: Not on file   Sexually Abused: Not on file  :  Review of Systems: A comprehensive 14 point review of systems was negative except as noted in the HPI.  Exam: No data found.  General:  well-nourished in no acute distress.   Eyes:  no scleral icterus.   ENT:  There were no oropharyngeal lesions.   Neck was without thyromegaly.   Lymphatics:  Negative cervical, supraclavicular or axillary adenopathy.   Respiratory: lungs were clear bilaterally without wheezing or crackles.   Cardiovascular:  Regular rate and rhythm, S1/S2, without murmur, rub or gallop.  There was no pedal edema.   GI:  abdomen was soft, flat, nontender, nondistended, without organomegaly.   Musculoskeletal:  no spinal tenderness of palpation of vertebral spine.   Skin exam was without echymosis, petichae.   Neuro exam was nonfocal. Patient was alert and oriented.  Attention was good.   Language was appropriate.  Mood was normal without depression.  Speech was not pressured.  Thought content was not tangential.     Lab Results  Component Value Date   WBC 14.0 (H) 04/21/2020   HGB 13.3 04/21/2020   HCT 39.4 04/21/2020   PLT 278 04/21/2020   GLUCOSE 62 (L) 04/10/2020   ALT 16 04/10/2020   AST 17 04/10/2020   NA 140 04/10/2020   K 3.8 04/10/2020   CL 104  04/10/2020   CREATININE 0.75 04/10/2020   BUN 13 04/10/2020   CO2 30 04/10/2020    MR Brain W Wo Contrast  Result Date: 04/18/2020 CLINICAL DATA:  Headache, new or worsening, Hematologic malignancy, staging Patient with newly diagnosed large B cell lymphoma with new headaches evaluation for CNS involvement EXAM: MRI HEAD WITHOUT AND WITH CONTRAST TECHNIQUE: Multiplanar, multiecho pulse sequences of the brain and surrounding structures were obtained without and with intravenous contrast. CONTRAST:  23m GADAVIST GADOBUTROL 1 MMOL/ML IV SOLN COMPARISON:  None. FINDINGS: Brain: No diffusion-weighted signal abnormality. No intracranial hemorrhage. No midline shift, ventriculomegaly or extra-axial fluid collection. No mass lesion. No abnormal enhancement. Cerebral volume is within  normal limits. Minimal chronic microvascular ischemic changes. Vascular: Normal flow voids. Skull and upper cervical spine: Normal marrow signal. Sinuses/Orbits: Normal orbits. Small right maxillary and nasopharyngeal mucous retention cysts. Small right mastoid effusion. Other: None. IMPRESSION: No acute intracranial process. No evidence of metastatic disease. Electronically Signed   By: Primitivo Gauze M.D.   On: 04/18/2020 12:01   NM PET Image Initial (PI) Skull Base To Thigh  Result Date: 04/08/2020 CLINICAL DATA:  Initial treatment strategy for reported bulky mediastinal and bilateral hilar lymphadenopathy on recent outside chest CT angiogram study. History of prostate cancer in 2013. EXAM: NUCLEAR MEDICINE PET SKULL BASE TO THIGH TECHNIQUE: 6.7 mCi F-18 FDG was injected intravenously. Full-ring PET imaging was performed from the skull base to thigh after the radiotracer. CT data was obtained and used for attenuation correction and anatomic localization. Fasting blood glucose: 85 mg/dl COMPARISON:  None. FINDINGS: Mediastinal blood pool activity: SUV max 1.9 Liver activity: SUV max 2.6 NECK: Mildly to moderately enlarged  hypermetabolic bilateral neck lymph nodes involving level II on the right and levels III and IV on the left. Representative 0.9 cm right level II neck node with max SUV 15.5 (series 4/image 36). Representative 1.8 cm left level IV/supraclavicular neck node with max SUV 22.6 (series 4/image 40). Incidental CT findings: none CHEST: Mildly enlarged hypermetabolic bilateral axillary lymph nodes, largest 1.6 cm on the left with max SUV 21.5 (series 4/image 55). Bulky hypermetabolic bilateral mediastinal and bilateral hilar lymph nodes. Representative 3.3 cm short axis diameter subcarinal node with max SUV 22.1 (series 4/image 76). Representative 2.1 cm AP window node with max SUV 18.8 (series 4/image 68). Representative high right paratracheal 1.2 cm node near the thoracic inlet with max SUV 20.7 (series 4/image 56). Representative 1.1 cm anterior low paraesophageal node with max SUV 10.8 (series 4/image 100). Right hilar adenopathy with max SUV 24.9 and left hilar adenopathy with max SUV 20.3. Several necrotic lymph nodes are scattered in the mediastinum and hila, for example measuring 3.4 cm in the high right mediastinum near the thoracic inlet (series 4/image 51). No hypermetabolic pulmonary findings. Incidental CT findings: Left anterior descending and right coronary atherosclerosis. Minimally atherosclerotic nonaneurysmal thoracic aorta. No significant pulmonary nodules. ABDOMEN/PELVIS: Numerous hypermetabolic slightly hypodense masses throughout the spleen, largest 2.1 cm in the inferior spleen with max SUV 12.5 (series 4/image 120). Spleen is overall normal size. Several hypermetabolic enlarged lymph nodes throughout the retroperitoneum involving left para celiac and aortocaval chains. Representative largely necrotic high left para celiac 3.2 cm node with max SUV 10.0 (series 4/image 113). Splenic hilar 1.2 cm node with max SUV 17.6. Aortocaval 0.7 cm node with max SUV 12.7 (series 4/image 120). No enlarged or  hypermetabolic pelvic lymph nodes. No abnormal hypermetabolic activity within the liver, pancreas or adrenal glands. Incidental CT findings: Top-normal size prostate with scattered nonspecific internal prostatic calcifications. SKELETON: No focal hypermetabolic activity to suggest skeletal metastasis. Incidental CT findings: none IMPRESSION: 1. Intensely hypermetabolic bilateral neck, bilateral axillary, bilateral mediastinal, bilateral hilar and retroperitoneal lymphadenopathy. Numerous small hypermetabolic splenic masses. Findings are compatible with malignancy, favoring lymphoproliferative disorder. The most accessible pathologic lymph nodes for potential percutaneous biopsy are likely within the left axilla and left supraclavicular neck. 2. Chronic findings include: Aortic Atherosclerosis (ICD10-I70.0). Coronary atherosclerosis. Electronically Signed   By: Ilona Sorrel M.D.   On: 04/08/2020 13:58   [place] CORE BIOPSY (LYMPH NODES)  Result Date: 04/10/2020 INDICATION: Bulky adenopathy, concern for lymphoma EXAM: ULTRASOUND GUIDED CORE BIOPSY OF LEFT SUPRACLAVICULAR  ADENOPATHY MEDICATIONS: 1% LIDOCAINE LOCAL ANESTHESIA/SEDATION: Versed 1.39m IV; Fentanyl 566m IV; Moderate Sedation Time:  10 MINUTES The patient was continuously monitored during the procedure by the interventional radiology nurse under my direct supervision. FLUOROSCOPY TIME:  Fluoroscopy Time: None. COMPLICATIONS: None immediate. PROCEDURE: The procedure, risks, benefits, and alternatives were explained to the patient. Questions regarding the procedure were encouraged and answered. The patient understands and consents to the procedure. The left supraclavicular area was prepped with ChloraPrep in a sterile fashion, and a sterile drape was applied covering the operative field. A sterile gown and sterile gloves were used for the procedure. Local anesthesia was provided with 1% Lidocaine. Previous imaging reviewed. Preliminary ultrasound performed.  The left supraclavicular bulky adenopathy was localized and marked. Under sterile conditions and local anesthesia, an 18 gauge core biopsy needle was advanced to the supraclavicular adenopathy. 6 18 gauge core biopsies obtained. These were intact and non fragmented. Samples placed in saline. Postprocedure imaging demonstrates no hemorrhage or hematoma. Patient tolerated biopsy well. FINDINGS: Imaging confirms needle placed into the left supraclavicular adenopathy for core biopsy IMPRESSION: Successful ultrasound left supraclavicular adenopathy 18 gauge core biopsies Electronically Signed   By: M.Jerilynn Mages Shick M.D.   On: 04/10/2020 09:01   IR PICC PLACEMENT RIGHT >5 YRS INC IMG GUIDE  Result Date: 04/21/2020 INDICATION: History of lymphoma. In need of durable intravenous access for the initiation chemotherapy. EXAM: ULTRASOUND AND FLUOROSCOPIC GUIDED PICC LINE INSERTION MEDICATIONS: None. CONTRAST:  Cc Omnipaque 300 FLUOROSCOPY TIME:  18 seconds COMPLICATIONS: None immediate. TECHNIQUE: The procedure, risks, benefits, and alternatives were explained to the patient and informed written consent was obtained. A timeout was performed prior to the initiation of the procedure. The right upper extremity was prepped with chlorhexidine in a sterile fashion, and a sterile drape was applied covering the operative field. Maximum barrier sterile technique with sterile gowns and gloves were used for the procedure. A timeout was performed prior to the initiation of the procedure. Local anesthesia was provided with 1% lidocaine. Under direct ultrasound guidance, the brachial vein was accessed with a micropuncture kit after the overlying soft tissues were anesthetized with 1% lidocaine. After the overlying soft tissues were anesthetized, a small venotomy incision was created and a micropuncture kit was utilized to access the right brachial vein. Real-time ultrasound guidance was utilized for vascular access including the acquisition  of a permanent ultrasound image documenting patency of the accessed vessel. A guidewire was advanced to the level of the level of the axilla and a peel-away sheath was placed. As there was difficulty advancing the microwire centrally, a limited venogram was performed via the micropuncture sheath demonstrating patency of the right upper extremity central venous system. Ultimately, a microwire was advanced to the level of the superior cavoatrial junction and utilized for measurement purposes. Next, a 38 cm, 5 FrPakistandual lumen was inserted to level of the superior caval-atrial junction. A post procedure spot fluoroscopic was obtained. The catheter easily aspirated and flushed and was secured in place with stat lock device. A dressing was applied. The patient tolerated the procedure well without immediate post procedural complication. FINDINGS: After catheter placement, the tip lies within the superior cavoatrial junction. The catheter aspirates and flushes normally and is ready for immediate use. IMPRESSION: Successful ultrasound and fluoroscopic guided placement of a right brachial vein approach, cm, 5 French, dual lumen PICC with tip at the superior caval-atrial junction. The PICC line is ready for immediate use. Electronically Signed   By: JoSandi Mariscal  M.D.   On: 04/21/2020 09:34     MR Brain W Wo Contrast  Result Date: 04/18/2020 CLINICAL DATA:  Headache, new or worsening, Hematologic malignancy, staging Patient with newly diagnosed large B cell lymphoma with new headaches evaluation for CNS involvement EXAM: MRI HEAD WITHOUT AND WITH CONTRAST TECHNIQUE: Multiplanar, multiecho pulse sequences of the brain and surrounding structures were obtained without and with intravenous contrast. CONTRAST:  22m GADAVIST GADOBUTROL 1 MMOL/ML IV SOLN COMPARISON:  None. FINDINGS: Brain: No diffusion-weighted signal abnormality. No intracranial hemorrhage. No midline shift, ventriculomegaly or extra-axial fluid collection.  No mass lesion. No abnormal enhancement. Cerebral volume is within normal limits. Minimal chronic microvascular ischemic changes. Vascular: Normal flow voids. Skull and upper cervical spine: Normal marrow signal. Sinuses/Orbits: Normal orbits. Small right maxillary and nasopharyngeal mucous retention cysts. Small right mastoid effusion. Other: None. IMPRESSION: No acute intracranial process. No evidence of metastatic disease. Electronically Signed   By: CPrimitivo GauzeM.D.   On: 04/18/2020 12:01   NM PET Image Initial (PI) Skull Base To Thigh  Result Date: 04/08/2020 CLINICAL DATA:  Initial treatment strategy for reported bulky mediastinal and bilateral hilar lymphadenopathy on recent outside chest CT angiogram study. History of prostate cancer in 2013. EXAM: NUCLEAR MEDICINE PET SKULL BASE TO THIGH TECHNIQUE: 6.7 mCi F-18 FDG was injected intravenously. Full-ring PET imaging was performed from the skull base to thigh after the radiotracer. CT data was obtained and used for attenuation correction and anatomic localization. Fasting blood glucose: 85 mg/dl COMPARISON:  None. FINDINGS: Mediastinal blood pool activity: SUV max 1.9 Liver activity: SUV max 2.6 NECK: Mildly to moderately enlarged hypermetabolic bilateral neck lymph nodes involving level II on the right and levels III and IV on the left. Representative 0.9 cm right level II neck node with max SUV 15.5 (series 4/image 36). Representative 1.8 cm left level IV/supraclavicular neck node with max SUV 22.6 (series 4/image 40). Incidental CT findings: none CHEST: Mildly enlarged hypermetabolic bilateral axillary lymph nodes, largest 1.6 cm on the left with max SUV 21.5 (series 4/image 55). Bulky hypermetabolic bilateral mediastinal and bilateral hilar lymph nodes. Representative 3.3 cm short axis diameter subcarinal node with max SUV 22.1 (series 4/image 76). Representative 2.1 cm AP window node with max SUV 18.8 (series 4/image 68). Representative high  right paratracheal 1.2 cm node near the thoracic inlet with max SUV 20.7 (series 4/image 56). Representative 1.1 cm anterior low paraesophageal node with max SUV 10.8 (series 4/image 100). Right hilar adenopathy with max SUV 24.9 and left hilar adenopathy with max SUV 20.3. Several necrotic lymph nodes are scattered in the mediastinum and hila, for example measuring 3.4 cm in the high right mediastinum near the thoracic inlet (series 4/image 51). No hypermetabolic pulmonary findings. Incidental CT findings: Left anterior descending and right coronary atherosclerosis. Minimally atherosclerotic nonaneurysmal thoracic aorta. No significant pulmonary nodules. ABDOMEN/PELVIS: Numerous hypermetabolic slightly hypodense masses throughout the spleen, largest 2.1 cm in the inferior spleen with max SUV 12.5 (series 4/image 120). Spleen is overall normal size. Several hypermetabolic enlarged lymph nodes throughout the retroperitoneum involving left para celiac and aortocaval chains. Representative largely necrotic high left para celiac 3.2 cm node with max SUV 10.0 (series 4/image 113). Splenic hilar 1.2 cm node with max SUV 17.6. Aortocaval 0.7 cm node with max SUV 12.7 (series 4/image 120). No enlarged or hypermetabolic pelvic lymph nodes. No abnormal hypermetabolic activity within the liver, pancreas or adrenal glands. Incidental CT findings: Top-normal size prostate with scattered nonspecific internal prostatic calcifications. SKELETON:  No focal hypermetabolic activity to suggest skeletal metastasis. Incidental CT findings: none IMPRESSION: 1. Intensely hypermetabolic bilateral neck, bilateral axillary, bilateral mediastinal, bilateral hilar and retroperitoneal lymphadenopathy. Numerous small hypermetabolic splenic masses. Findings are compatible with malignancy, favoring lymphoproliferative disorder. The most accessible pathologic lymph nodes for potential percutaneous biopsy are likely within the left axilla and left  supraclavicular neck. 2. Chronic findings include: Aortic Atherosclerosis (ICD10-I70.0). Coronary atherosclerosis. Electronically Signed   By: Ilona Sorrel M.D.   On: 04/08/2020 13:58   [place] CORE BIOPSY (LYMPH NODES)  Result Date: 04/10/2020 INDICATION: Bulky adenopathy, concern for lymphoma EXAM: ULTRASOUND GUIDED CORE BIOPSY OF LEFT SUPRACLAVICULAR ADENOPATHY MEDICATIONS: 1% LIDOCAINE LOCAL ANESTHESIA/SEDATION: Versed 1.32m IV; Fentanyl 581m IV; Moderate Sedation Time:  10 MINUTES The patient was continuously monitored during the procedure by the interventional radiology nurse under my direct supervision. FLUOROSCOPY TIME:  Fluoroscopy Time: None. COMPLICATIONS: None immediate. PROCEDURE: The procedure, risks, benefits, and alternatives were explained to the patient. Questions regarding the procedure were encouraged and answered. The patient understands and consents to the procedure. The left supraclavicular area was prepped with ChloraPrep in a sterile fashion, and a sterile drape was applied covering the operative field. A sterile gown and sterile gloves were used for the procedure. Local anesthesia was provided with 1% Lidocaine. Previous imaging reviewed. Preliminary ultrasound performed. The left supraclavicular bulky adenopathy was localized and marked. Under sterile conditions and local anesthesia, an 18 gauge core biopsy needle was advanced to the supraclavicular adenopathy. 6 18 gauge core biopsies obtained. These were intact and non fragmented. Samples placed in saline. Postprocedure imaging demonstrates no hemorrhage or hematoma. Patient tolerated biopsy well. FINDINGS: Imaging confirms needle placed into the left supraclavicular adenopathy for core biopsy IMPRESSION: Successful ultrasound left supraclavicular adenopathy 18 gauge core biopsies Electronically Signed   By: M.Jerilynn Mages Shick M.D.   On: 04/10/2020 09:01   IR PICC PLACEMENT RIGHT >5 YRS INC IMG GUIDE  Result Date: 04/21/2020 INDICATION:  History of lymphoma. In need of durable intravenous access for the initiation chemotherapy. EXAM: ULTRASOUND AND FLUOROSCOPIC GUIDED PICC LINE INSERTION MEDICATIONS: None. CONTRAST:  Cc Omnipaque 300 FLUOROSCOPY TIME:  18 seconds COMPLICATIONS: None immediate. TECHNIQUE: The procedure, risks, benefits, and alternatives were explained to the patient and informed written consent was obtained. A timeout was performed prior to the initiation of the procedure. The right upper extremity was prepped with chlorhexidine in a sterile fashion, and a sterile drape was applied covering the operative field. Maximum barrier sterile technique with sterile gowns and gloves were used for the procedure. A timeout was performed prior to the initiation of the procedure. Local anesthesia was provided with 1% lidocaine. Under direct ultrasound guidance, the brachial vein was accessed with a micropuncture kit after the overlying soft tissues were anesthetized with 1% lidocaine. After the overlying soft tissues were anesthetized, a small venotomy incision was created and a micropuncture kit was utilized to access the right brachial vein. Real-time ultrasound guidance was utilized for vascular access including the acquisition of a permanent ultrasound image documenting patency of the accessed vessel. A guidewire was advanced to the level of the level of the axilla and a peel-away sheath was placed. As there was difficulty advancing the microwire centrally, a limited venogram was performed via the micropuncture sheath demonstrating patency of the right upper extremity central venous system. Ultimately, a microwire was advanced to the level of the superior cavoatrial junction and utilized for measurement purposes. Next, a 38 cm, 5 FrPakistandual lumen was inserted to level  of the superior caval-atrial junction. A post procedure spot fluoroscopic was obtained. The catheter easily aspirated and flushed and was secured in place with stat lock  device. A dressing was applied. The patient tolerated the procedure well without immediate post procedural complication. FINDINGS: After catheter placement, the tip lies within the superior cavoatrial junction. The catheter aspirates and flushes normally and is ready for immediate use. IMPRESSION: Successful ultrasound and fluoroscopic guided placement of a right brachial vein approach, cm, 5 French, dual lumen PICC with tip at the superior caval-atrial junction. The PICC line is ready for immediate use. Electronically Signed   By: Sandi Mariscal M.D.   On: 04/21/2020 09:34   Assessment and Plan:   1) Newly diagnosed advanced stage IIIA Large B cell lymphoma - activated B cell type 04/08/2020 PET/CT ([id number]) revealed "1. Intensely hypermetabolic bilateral neck, bilateral axillary, bilateral mediastinal, bilateral hilar and retroperitoneal lymphadenopathy. Numerous small hypermetabolic splenic masses. 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  Chemotherapy education has been completed in our office already.  Adverse effects of been discussed with the patient including but not limited to alopecia, myelosuppression, nausea, vomiting, peripheral neuropathy, hepatic and renal dysfunction, and possible infusion reaction with rituximab.  The patient agrees to proceed. -We will obtain a stat echocardiogram to evaluate baseline LVEF. -Obtain baseline labs including a CBC with differential, CMET, LDH, uric acid.  Will check daily labs. -Begin allopurinol 100 mg twice a day. -As needed antiemetics and bowel regimen have been ordered. -Continue home pain medication. -We will plan for Port-A-Cath placement by IR as an outpatient prior to cycle #2. -The patient will have rituximab and G-CSF as an outpatient 04/28/2020 and will have a lab and follow-up visit for toxicity check on 05/05/2020.  Mikey Bussing, DNP,  AGPCNP-BC, AOCNP   ADDENDUM  .Patient was Personally and independently interviewed, examined and relevant elements of the history of present illness were reviewed in details and an assessment and plan was created. All elements of the patient's history of present illness , assessment and plan were discussed in details with Mikey Bussing, DNP, AGPCNP-BC, AOCNP. The above documentation reflects our combined findings assessment and plan.  Sullivan Lone MD MS

## 2020-04-21 NOTE — Progress Notes (Signed)
°  Echocardiogram 2D Echocardiogram has been performed.  Bryan [name] 04/21/2020, 11:43 AM

## 2020-04-22 DIAGNOSIS — Z5111 Encounter for antineoplastic chemotherapy: Secondary | ICD-10-CM | POA: Diagnosis not present

## 2020-04-22 DIAGNOSIS — C8338 Diffuse large B-cell lymphoma, lymph nodes of multiple sites: Secondary | ICD-10-CM | POA: Diagnosis not present

## 2020-04-22 LAB — CBC WITH DIFFERENTIAL/PLATELET
Abs Immature Granulocytes: 0.1 10*3/uL — ABNORMAL HIGH (ref 0.00–0.07)
Basophils Absolute: 0 10*3/uL (ref 0.0–0.1)
Basophils Relative: 0 %
Eosinophils Absolute: 0 10*3/uL (ref 0.0–0.5)
Eosinophils Relative: 0 %
HCT: 36.9 % — ABNORMAL LOW (ref 39.0–52.0)
Hemoglobin: 12.6 g/dL — ABNORMAL LOW (ref 13.0–17.0)
Immature Granulocytes: 1 %
Lymphocytes Relative: 7 %
Lymphs Abs: 1 10*3/uL (ref 0.7–4.0)
MCH: 30.5 pg (ref 26.0–34.0)
MCHC: 34.1 g/dL (ref 30.0–36.0)
MCV: 89.3 fL (ref 80.0–100.0)
Monocytes Absolute: 1.2 10*3/uL — ABNORMAL HIGH (ref 0.1–1.0)
Monocytes Relative: 8 %
Neutro Abs: 12.2 10*3/uL — ABNORMAL HIGH (ref 1.7–7.7)
Neutrophils Relative %: 84 %
Platelets: 320 10*3/uL (ref 150–400)
RBC: 4.13 MIL/uL — ABNORMAL LOW (ref 4.22–5.81)
RDW: 11.9 % (ref 11.5–15.5)
WBC: 14.5 10*3/uL — ABNORMAL HIGH (ref 4.0–10.5)
nRBC: 0 % (ref 0.0–0.2)

## 2020-04-22 LAB — COMPREHENSIVE METABOLIC PANEL
ALT: 25 U/L (ref 0–44)
AST: 22 U/L (ref 15–41)
Albumin: 3.3 g/dL — ABNORMAL LOW (ref 3.5–5.0)
Alkaline Phosphatase: 62 U/L (ref 38–126)
Anion gap: 10 (ref 5–15)
BUN: 14 mg/dL (ref 8–23)
CO2: 20 mmol/L — ABNORMAL LOW (ref 22–32)
Calcium: 8.4 mg/dL — ABNORMAL LOW (ref 8.9–10.3)
Chloride: 105 mmol/L (ref 98–111)
Creatinine, Ser: 0.58 mg/dL — ABNORMAL LOW (ref 0.61–1.24)
GFR, Estimated: >60 mL/min (ref >60)
Glucose, Bld: 96 mg/dL (ref 70–99)
Potassium: 3.5 mmol/L (ref 3.5–5.1)
Sodium: 135 mmol/L (ref 135–145)
Total Bilirubin: 0.3 mg/dL (ref 0.3–1.2)
Total Protein: 6 g/dL — ABNORMAL LOW (ref 6.5–8.1)

## 2020-04-22 LAB — LACTATE DEHYDROGENASE: LDH: 174 U/L (ref 98–192)

## 2020-04-22 LAB — URIC ACID: Uric Acid, Serum: 4.2 mg/dL (ref 3.7–8.6)

## 2020-04-22 MED ORDER — SENNOSIDES-DOCUSATE SODIUM 8.6-50 MG PO TABS
1.0000 | ORAL_TABLET | Freq: Two times a day (BID) | ORAL | Status: DC
  Administered 2020-04-22 – 2020-04-25 (×7): 1 via ORAL
  Filled 2020-04-22 (×7): qty 1

## 2020-04-22 MED ORDER — DRONABINOL 2.5 MG PO CAPS
2.5000 mg | ORAL_CAPSULE | Freq: Two times a day (BID) | ORAL | Status: DC
  Administered 2020-04-22 – 2020-04-24 (×5): 2.5 mg via ORAL
  Filled 2020-04-22 (×5): qty 1

## 2020-04-22 MED ORDER — POLYETHYLENE GLYCOL 3350 17 G PO PACK
17.0000 g | PACK | Freq: Every day | ORAL | Status: DC
  Administered 2020-04-23 – 2020-04-25 (×3): 17 g via ORAL
  Filled 2020-04-22 (×2): qty 1

## 2020-04-22 MED ORDER — SODIUM CHLORIDE 0.9 % IV SOLN
Freq: Once | INTRAVENOUS | Status: AC
  Administered 2020-04-22: 18 mg via INTRAVENOUS
  Filled 2020-04-22: qty 4

## 2020-04-22 MED ORDER — VINCRISTINE SULFATE CHEMO INJECTION 1 MG/ML
Freq: Once | INTRAVENOUS | Status: AC
  Filled 2020-04-22: qty 9

## 2020-04-22 MED ORDER — LORAZEPAM 0.5 MG PO TABS
0.5000 mg | ORAL_TABLET | Freq: Four times a day (QID) | ORAL | Status: DC | PRN
  Administered 2020-04-23: 0.5 mg via ORAL

## 2020-04-22 NOTE — Progress Notes (Addendum)
HEMATOLOGY-ONCOLOGY PROGRESS NOTE  SUBJECTIVE: Tolerated day 1 of chemotherapy well so far.  He has some mild nausea this morning and took Compazine.  No vomiting reported.  No mucositis.  Reports mild constipation and took MiraLAX this morning.  Continues to have intermittent arthralgias and uses Tylenol #3 which is effective.  Oncology History  Diffuse large B cell lymphoma (HCC)  04/16/2020 Initial Diagnosis   Diffuse large B cell lymphoma (HCC)   04/21/2020 -  Chemotherapy   The patient had dexamethasone (DECADRON) 4 MG tablet, 8 mg, Oral, 2 times daily with meals, 1 of 1 cycle, Start date: --, End date: -- DOXOrubicin (ADRIAMYCIN) 18 mg, etoposide (VEPESID) 86 mg, vinCRIStine (ONCOVIN) 0.7 mg in sodium chloride 0.9 % 1,000 mL chemo infusion, , Intravenous, Once, 1 of 4 cycles ondansetron (ZOFRAN) 8 mg, dexamethasone (DECADRON) 10 mg in sodium chloride 0.9 % 50 mL IVPB, , Intravenous,  Once, 1 of 4 cycles cyclophosphamide (CYTOXAN) 1,300 mg in sodium chloride 0.9 % 250 mL chemo infusion, 750 mg/m2 = 1,300 mg, Intravenous,  Once, 1 of 4 cycles  for chemotherapy treatment.    04/28/2020 -  Chemotherapy   The patient had pegfilgrastim-cbqv (UDENYCA) injection 6 mg, 6 mg, Subcutaneous, Once, 0 of 6 cycles riTUXimab-pvvr (RUXIENCE) 600 mg in sodium chloride 0.9 % 250 mL (1.9355 mg/mL) infusion, 375 mg/m2 = 600 mg, Intravenous,  Once, 0 of 6 cycles  for chemotherapy treatment.       REVIEW OF SYSTEMS:   Constitutional: Denies fevers, chills Eyes: Denies blurriness of vision Ears, nose, mouth, throat, and face: Denies mucositis or sore throat Respiratory: Denies cough, dyspnea or wheezes Cardiovascular: Denies palpitation, chest discomfort Gastrointestinal: Reports mild nausea and constipation Skin: Denies abnormal skin rashes Lymphatics: Denies new lymphadenopathy or easy bruising Neurological:Denies numbness, tingling or new weaknesses Behavioral/Psych: Mood is stable, no new changes   Extremities: No lower extremity edema All other systems were reviewed with the patient and are negative.  I have reviewed the past medical history, past surgical history, social history and family history with the patient and they are unchanged from previous note.   PHYSICAL EXAMINATION: ECOG PERFORMANCE STATUS: 1 - Symptomatic but completely ambulatory  There were no vitals filed for this visit. There were no vitals filed for this visit.  Intake/Output from previous day: 11/15 0701 - 11/16 0700 In: 1015.2 [P.O.:240; IV Piggyback:775.2] Out: -   GENERAL:alert, no distress and comfortable SKIN: skin color, texture, turgor are normal, no rashes or significant lesions EYES: normal, Conjunctiva are pink and non-injected, sclera clear OROPHARYNX:no exudate, no erythema and lips, buccal mucosa, and tongue normal  NECK: supple, thyroid normal size, non-tender, without nodularity LYMPH:  no palpable lymphadenopathy in the cervical, axillary or inguinal LUNGS: clear to auscultation and percussion with normal breathing effort HEART: regular rate & rhythm and no murmurs and no lower extremity edema ABDOMEN:abdomen soft, non-tender and normal bowel sounds Musculoskeletal:no cyanosis of digits and no clubbing  NEURO: alert & oriented x 3 with fluent speech, no focal motor/sensory deficits  LABORATORY DATA:  I have reviewed the data as listed CMP Latest Ref Rng & Units 04/21/2020 04/10/2020  Glucose 70 - 99 mg/dL 119(H) 62(L)  BUN 8 - 23 mg/dL 14 13  Creatinine 0.61 - 1.24 mg/dL 0.65 0.75  Sodium 135 - 145 mmol/L 131(L) 140  Potassium 3.5 - 5.1 mmol/L 4.0 3.8  Chloride 98 - 111 mmol/L 96(L) 104  CO2 22 - 32 mmol/L 27 30  Calcium 8.9 - 10.3 mg/dL   8.9 9.1  Total Protein 6.5 - 8.1 g/dL 6.6 6.7  Total Bilirubin 0.3 - 1.2 mg/dL 0.6 0.3  Alkaline Phos 38 - 126 U/L 68 79  AST 15 - 41 U/L 21 17  ALT 0 - 44 U/L 22 16    Lab Results  Component Value Date   WBC 14.0 (H) 04/21/2020   HGB  13.3 04/21/2020   HCT 39.4 04/21/2020   MCV 89.3 04/21/2020   PLT 278 04/21/2020   NEUTROABS 10.8 (H) 04/21/2020    MR Brain W Wo Contrast  Result Date: 04/18/2020 CLINICAL DATA:  Headache, new or worsening, Hematologic malignancy, staging Patient with newly diagnosed large B cell lymphoma with new headaches evaluation for CNS involvement EXAM: MRI HEAD WITHOUT AND WITH CONTRAST TECHNIQUE: Multiplanar, multiecho pulse sequences of the brain and surrounding structures were obtained without and with intravenous contrast. CONTRAST:  6mL GADAVIST GADOBUTROL 1 MMOL/ML IV SOLN COMPARISON:  None. FINDINGS: Brain: No diffusion-weighted signal abnormality. No intracranial hemorrhage. No midline shift, ventriculomegaly or extra-axial fluid collection. No mass lesion. No abnormal enhancement. Cerebral volume is within normal limits. Minimal chronic microvascular ischemic changes. Vascular: Normal flow voids. Skull and upper cervical spine: Normal marrow signal. Sinuses/Orbits: Normal orbits. Small right maxillary and nasopharyngeal mucous retention cysts. Small right mastoid effusion. Other: None. IMPRESSION: No acute intracranial process. No evidence of metastatic disease. Electronically Signed   By: Chikanele  Emekauwa M.D.   On: 04/18/2020 12:01   NM PET Image Initial (PI) Skull Base To Thigh  Result Date: 04/08/2020 CLINICAL DATA:  Initial treatment strategy for reported bulky mediastinal and bilateral hilar lymphadenopathy on recent outside chest CT angiogram study. History of prostate cancer in 2013. EXAM: NUCLEAR MEDICINE PET SKULL BASE TO THIGH TECHNIQUE: 6.7 mCi F-18 FDG was injected intravenously. Full-ring PET imaging was performed from the skull base to thigh after the radiotracer. CT data was obtained and used for attenuation correction and anatomic localization. Fasting blood glucose: 85 mg/dl COMPARISON:  None. FINDINGS: Mediastinal blood pool activity: SUV max 1.9 Liver activity: SUV max 2.6 NECK:  Mildly to moderately enlarged hypermetabolic bilateral neck lymph nodes involving level II on the right and levels III and IV on the left. Representative 0.9 cm right level II neck node with max SUV 15.5 (series 4/image 36). Representative 1.8 cm left level IV/supraclavicular neck node with max SUV 22.6 (series 4/image 40). Incidental CT findings: none CHEST: Mildly enlarged hypermetabolic bilateral axillary lymph nodes, largest 1.6 cm on the left with max SUV 21.5 (series 4/image 55). Bulky hypermetabolic bilateral mediastinal and bilateral hilar lymph nodes. Representative 3.3 cm short axis diameter subcarinal node with max SUV 22.1 (series 4/image 76). Representative 2.1 cm AP window node with max SUV 18.8 (series 4/image 68). Representative high right paratracheal 1.2 cm node near the thoracic inlet with max SUV 20.7 (series 4/image 56). Representative 1.1 cm anterior low paraesophageal node with max SUV 10.8 (series 4/image 100). Right hilar adenopathy with max SUV 24.9 and left hilar adenopathy with max SUV 20.3. Several necrotic lymph nodes are scattered in the mediastinum and hila, for example measuring 3.4 cm in the high right mediastinum near the thoracic inlet (series 4/image 51). No hypermetabolic pulmonary findings. Incidental CT findings: Left anterior descending and right coronary atherosclerosis. Minimally atherosclerotic nonaneurysmal thoracic aorta. No significant pulmonary nodules. ABDOMEN/PELVIS: Numerous hypermetabolic slightly hypodense masses throughout the spleen, largest 2.1 cm in the inferior spleen with max SUV 12.5 (series 4/image 120). Spleen is overall normal size. Several hypermetabolic enlarged lymph   nodes throughout the retroperitoneum involving left para celiac and aortocaval chains. Representative largely necrotic high left para celiac 3.2 cm node with max SUV 10.0 (series 4/image 113). Splenic hilar 1.2 cm node with max SUV 17.6. Aortocaval 0.7 cm node with max SUV 12.7 (series  4/image 120). No enlarged or hypermetabolic pelvic lymph nodes. No abnormal hypermetabolic activity within the liver, pancreas or adrenal glands. Incidental CT findings: Top-normal size prostate with scattered nonspecific internal prostatic calcifications. SKELETON: No focal hypermetabolic activity to suggest skeletal metastasis. Incidental CT findings: none IMPRESSION: 1. Intensely hypermetabolic bilateral neck, bilateral axillary, bilateral mediastinal, bilateral hilar and retroperitoneal lymphadenopathy. Numerous small hypermetabolic splenic masses. Findings are compatible with malignancy, favoring lymphoproliferative disorder. The most accessible pathologic lymph nodes for potential percutaneous biopsy are likely within the left axilla and left supraclavicular neck. 2. Chronic findings include: Aortic Atherosclerosis (ICD10-I70.0). Coronary atherosclerosis. Electronically Signed   By: Ilona Sorrel M.D.   On: 04/08/2020 13:58   ECHOCARDIOGRAM COMPLETE  Result Date: 04/21/2020    ECHOCARDIOGRAM REPORT   Patient Name:   ELONZO SOPP Date of Exam: 04/21/2020 Medical Rec #:  [id number]     Height:       68.0 in Accession #:    [id number]    Weight:       137.1 lb Date of Birth:  Dec 28, 1955    BSA:          1.741 m Patient Age:    84 years      BP:           107/70 mmHg Patient Gender: M             HR:           58 bpm. Exam Location:  Inpatient Procedure: 2D Echo STAT ECHO Indications:   chemotherapy evaluation  History:       Patient has no prior history of Echocardiogram examinations.                Cancer.  Sonographer:   Jannett Celestine RDCS (AE) Referring      Hamer Phys:  Sonographer Comments: Image acquisition challenging due to patient body habitus. IMPRESSIONS  1. Left ventricular ejection fraction, by estimation, is 65 to 70%. The left ventricle has normal function. The left ventricle has no regional wall motion abnormalities. Left ventricular diastolic function could not be evaluated.   2. Right ventricular systolic function is normal. The right ventricular size is normal.  3. The mitral valve is normal in structure. Trivial mitral valve regurgitation. No evidence of mitral stenosis.  4. The aortic valve is normal in structure. Aortic valve regurgitation is not visualized. No aortic stenosis is present.  5. The inferior vena cava is normal in size with greater than 50% respiratory variability, suggesting right atrial pressure of 3 mmHg. FINDINGS  Left Ventricle: Left ventricular ejection fraction, by estimation, is 65 to 70%. The left ventricle has normal function. The left ventricle has no regional wall motion abnormalities. The left ventricular internal cavity size was normal in size. There is  no left ventricular hypertrophy. Left ventricular diastolic function could not be evaluated. Right Ventricle: The right ventricular size is normal. No increase in right ventricular wall thickness. Right ventricular systolic function is normal. Left Atrium: Left atrial size was normal in size. Right Atrium: Right atrial size was normal in size. Pericardium: There is no evidence of pericardial effusion. Mitral Valve: The mitral valve is normal in structure. Trivial mitral valve regurgitation. No  evidence of mitral valve stenosis. Tricuspid Valve: The tricuspid valve is normal in structure. Tricuspid valve regurgitation is trivial. No evidence of tricuspid stenosis. Aortic Valve: The aortic valve is normal in structure. Aortic valve regurgitation is not visualized. No aortic stenosis is present. Pulmonic Valve: The pulmonic valve was normal in structure. Pulmonic valve regurgitation is trivial. No evidence of pulmonic stenosis. Aorta: The aortic root is normal in size and structure. Venous: The inferior vena cava is normal in size with greater than 50% respiratory variability, suggesting right atrial pressure of 3 mmHg. IAS/Shunts: There is right bowing of the interatrial septum, suggestive of elevated left  atrial pressure. No atrial level shunt detected by color flow Doppler. Mark Skains MD Electronically signed by Mark Skains MD Signature Date/Time: 04/21/2020/11:53:58 AM    Final    US CORE BIOPSY (LYMPH NODES)  Result Date: 04/10/2020 INDICATION: Bulky adenopathy, concern for lymphoma EXAM: ULTRASOUND GUIDED CORE BIOPSY OF LEFT SUPRACLAVICULAR ADENOPATHY MEDICATIONS: 1% LIDOCAINE LOCAL ANESTHESIA/SEDATION: Versed 1.0mg IV; Fentanyl 50mcg IV; Moderate Sedation Time:  10 MINUTES The patient was continuously monitored during the procedure by the interventional radiology nurse under my direct supervision. FLUOROSCOPY TIME:  Fluoroscopy Time: None. COMPLICATIONS: None immediate. PROCEDURE: The procedure, risks, benefits, and alternatives were explained to the patient. Questions regarding the procedure were encouraged and answered. The patient understands and consents to the procedure. The left supraclavicular area was prepped with ChloraPrep in a sterile fashion, and a sterile drape was applied covering the operative field. A sterile gown and sterile gloves were used for the procedure. Local anesthesia was provided with 1% Lidocaine. Previous imaging reviewed. Preliminary ultrasound performed. The left supraclavicular bulky adenopathy was localized and marked. Under sterile conditions and local anesthesia, an 18 gauge core biopsy needle was advanced to the supraclavicular adenopathy. 6 18 gauge core biopsies obtained. These were intact and non fragmented. Samples placed in saline. Postprocedure imaging demonstrates no hemorrhage or hematoma. Patient tolerated biopsy well. FINDINGS: Imaging confirms needle placed into the left supraclavicular adenopathy for core biopsy IMPRESSION: Successful ultrasound left supraclavicular adenopathy 18 gauge core biopsies Electronically Signed   By: M.  Shick M.D.   On: 04/10/2020 09:01   IR PICC PLACEMENT RIGHT >5 YRS INC IMG GUIDE  Result Date: 04/21/2020 INDICATION: History  of lymphoma. In need of durable intravenous access for the initiation chemotherapy. EXAM: ULTRASOUND AND FLUOROSCOPIC GUIDED PICC LINE INSERTION MEDICATIONS: None. CONTRAST:  Cc Omnipaque 300 FLUOROSCOPY TIME:  18 seconds COMPLICATIONS: None immediate. TECHNIQUE: The procedure, risks, benefits, and alternatives were explained to the patient and informed written consent was obtained. A timeout was performed prior to the initiation of the procedure. The right upper extremity was prepped with chlorhexidine in a sterile fashion, and a sterile drape was applied covering the operative field. Maximum barrier sterile technique with sterile gowns and gloves were used for the procedure. A timeout was performed prior to the initiation of the procedure. Local anesthesia was provided with 1% lidocaine. Under direct ultrasound guidance, the brachial vein was accessed with a micropuncture kit after the overlying soft tissues were anesthetized with 1% lidocaine. After the overlying soft tissues were anesthetized, a small venotomy incision was created and a micropuncture kit was utilized to access the right brachial vein. Real-time ultrasound guidance was utilized for vascular access including the acquisition of a permanent ultrasound image documenting patency of the accessed vessel. A guidewire was advanced to the level of the level of the axilla and a peel-away sheath was placed. As there was   difficulty advancing the microwire centrally, a limited venogram was performed via the micropuncture sheath demonstrating patency of the right upper extremity central venous system. Ultimately, a microwire was advanced to the level of the superior cavoatrial junction and utilized for measurement purposes. Next, a 38 cm, 5 [place], dual lumen was inserted to level of the superior caval-atrial junction. A post procedure spot fluoroscopic was obtained. The catheter easily aspirated and flushed and was secured in place with stat lock device. A  dressing was applied. The patient tolerated the procedure well without immediate post procedural complication. FINDINGS: After catheter placement, the tip lies within the superior cavoatrial junction. The catheter aspirates and flushes normally and is ready for immediate use. IMPRESSION: Successful ultrasound and fluoroscopic guided placement of a right brachial vein approach, cm, 5 French, dual lumen PICC with tip at the superior caval-atrial junction. The PICC line is ready for immediate use. Electronically Signed   By: Sandi Mariscal M.D.   On: 04/21/2020 09:34    ASSESSMENT AND PLAN: -The patient tolerated day 1 of cycle one of his chemotherapy well overall.  He reports mild nausea and constipation this morning.  Continue as needed Compazine and will adjust bowel regimen. -CBC from this morning has been reviewed.  He has mild leukocytosis and anemia.  CMET, LDH, uric acid are still pending.  We will plan to proceed with day 2 of cycle number one of his chemotherapy today as planned after review of lab work. -Continue to check daily CBC with differential, CMET, LDH, uric acid. -Continue allopurinol 100 mg twice a day. -Continue as needed Compazine and will add lorazepam 0.5 mg every 6 hours as needed for nausea or vomiting. -We will schedule MiraLAX daily and add Senokot S for constipation. -Continue home pain medication. -We will plan for Port-A-Cath placement by IR as an outpatient prior to cycle #2. -The patient will have rituximab and G-CSF as an outpatient 04/28/2020 and will have a lab and follow-up visit for toxicity check on 05/05/2020.     LOS: 1 day   Mikey Bussing, DNP, AGPCNP-BC, AOCNP 04/22/20   ADDENDUM  .Patient was Personally and independently interviewed, examined and relevant elements of the history of present illness were reviewed in details and an assessment and plan was created. All elements of the patient's history of present illness , assessment and plan were discussed  in details with Mikey Bussing, DNP, AGPCNP-BC, AOCNP. The above documentation reflects our combined findings assessment and plan.  Sullivan Lone MD MS

## 2020-04-23 ENCOUNTER — Other Ambulatory Visit: Payer: Self-pay | Admitting: *Deleted

## 2020-04-23 ENCOUNTER — Ambulatory Visit: Payer: PRIVATE HEALTH INSURANCE

## 2020-04-23 DIAGNOSIS — C8338 Diffuse large B-cell lymphoma, lymph nodes of multiple sites: Secondary | ICD-10-CM | POA: Diagnosis not present

## 2020-04-23 DIAGNOSIS — Z5111 Encounter for antineoplastic chemotherapy: Secondary | ICD-10-CM | POA: Diagnosis not present

## 2020-04-23 LAB — CBC WITH DIFFERENTIAL/PLATELET
Abs Immature Granulocytes: 0.09 10*3/uL — ABNORMAL HIGH (ref 0.00–0.07)
Basophils Absolute: 0 10*3/uL (ref 0.0–0.1)
Basophils Relative: 0 %
Eosinophils Absolute: 0 10*3/uL (ref 0.0–0.5)
Eosinophils Relative: 0 %
HCT: 36.2 % — ABNORMAL LOW (ref 39.0–52.0)
Hemoglobin: 12.3 g/dL — ABNORMAL LOW (ref 13.0–17.0)
Immature Granulocytes: 1 %
Lymphocytes Relative: 6 %
Lymphs Abs: 0.9 10*3/uL (ref 0.7–4.0)
MCH: 30.3 pg (ref 26.0–34.0)
MCHC: 34 g/dL (ref 30.0–36.0)
MCV: 89.2 fL (ref 80.0–100.0)
Monocytes Absolute: 1.1 10*3/uL — ABNORMAL HIGH (ref 0.1–1.0)
Monocytes Relative: 7 %
Neutro Abs: 12.8 10*3/uL — ABNORMAL HIGH (ref 1.7–7.7)
Neutrophils Relative %: 86 %
Platelets: 277 10*3/uL (ref 150–400)
RBC: 4.06 MIL/uL — ABNORMAL LOW (ref 4.22–5.81)
RDW: 11.9 % (ref 11.5–15.5)
WBC: 14.9 10*3/uL — ABNORMAL HIGH (ref 4.0–10.5)
nRBC: 0 % (ref 0.0–0.2)

## 2020-04-23 LAB — LACTATE DEHYDROGENASE: LDH: 170 U/L (ref 98–192)

## 2020-04-23 LAB — COMPREHENSIVE METABOLIC PANEL
ALT: 30 U/L (ref 0–44)
AST: 27 U/L (ref 15–41)
Albumin: 3.5 g/dL (ref 3.5–5.0)
Alkaline Phosphatase: 65 U/L (ref 38–126)
Anion gap: 8 (ref 5–15)
BUN: 18 mg/dL (ref 8–23)
CO2: 23 mmol/L (ref 22–32)
Calcium: 8.9 mg/dL (ref 8.9–10.3)
Chloride: 104 mmol/L (ref 98–111)
Creatinine, Ser: 0.6 mg/dL — ABNORMAL LOW (ref 0.61–1.24)
GFR, Estimated: >60 mL/min (ref >60)
Glucose, Bld: 98 mg/dL (ref 70–99)
Potassium: 3.7 mmol/L (ref 3.5–5.1)
Sodium: 135 mmol/L (ref 135–145)
Total Bilirubin: 0.5 mg/dL (ref 0.3–1.2)
Total Protein: 6.5 g/dL (ref 6.5–8.1)

## 2020-04-23 LAB — URIC ACID: Uric Acid, Serum: 4.1 mg/dL (ref 3.7–8.6)

## 2020-04-23 MED ORDER — VINCRISTINE SULFATE CHEMO INJECTION 1 MG/ML
Freq: Once | INTRAVENOUS | Status: AC
  Filled 2020-04-23: qty 9

## 2020-04-23 MED ORDER — SODIUM CHLORIDE 0.9 % IV SOLN
Freq: Once | INTRAVENOUS | Status: AC
  Administered 2020-04-23: 8 mg via INTRAVENOUS
  Filled 2020-04-23: qty 4

## 2020-04-23 MED ORDER — ZIEXTENZO 6 MG/0.6ML ~~LOC~~ SOSY: 6.0000 mg | PREFILLED_SYRINGE | Freq: Once | SUBCUTANEOUS | 2 refills | Status: DC

## 2020-04-23 NOTE — Progress Notes (Signed)
HEMATOLOGY-ONCOLOGY PROGRESS NOTE  SUBJECTIVE: Tolerated day 2 of chemotherapy well so far.  Patient notes some grade 1 nausea yesterday which is much better today.  He notes that he was walking the hallways last evening and had a good bowel movement this morning.  Notes that he has been able to take much deeper breaths and notes that his chest tightness has much improved since starting treatment.  He notes that he is emotionally in a better place. He only talks about how proud he is with his daughter. No fevers or chills.  No significant uncontrolled nausea.  Oncology History  Diffuse large B cell lymphoma ([place])  04/16/2020 Initial Diagnosis   Diffuse large B cell lymphoma ([place])   04/21/2020 -  Chemotherapy   The patient had dexamethasone (DECADRON) 4 MG tablet, 8 mg, Oral, 2 times daily with meals, 1 of 1 cycle, Start date: --, End date: -- DOXOrubicin (ADRIAMYCIN) 18 mg, etoposide (VEPESID) 86 mg, vinCRIStine (ONCOVIN) 0.7 mg in sodium chloride 0.9 % 1,000 mL chemo infusion, , Intravenous, Once, 1 of 4 cycles Administration:  (04/21/2020),  (04/22/2020) ondansetron (ZOFRAN) 8 mg, dexamethasone (DECADRON) 10 mg in sodium chloride 0.9 % 50 mL IVPB, , Intravenous,  Once, 1 of 4 cycles Administration: 18 mg (04/21/2020), 18 mg (04/22/2020) cyclophosphamide (CYTOXAN) 1,300 mg in sodium chloride 0.9 % 250 mL chemo infusion, 750 mg/m2 = 1,300 mg, Intravenous,  Once, 1 of 4 cycles  for chemotherapy treatment.    04/28/2020 -  Chemotherapy   The patient had pegfilgrastim-cbqv (UDENYCA) injection 6 mg, 6 mg, Subcutaneous, Once, 0 of 6 cycles riTUXimab-pvvr (RUXIENCE) 600 mg in sodium chloride 0.9 % 250 mL (1.9355 mg/mL) infusion, 375 mg/m2 = 600 mg, Intravenous,  Once, 0 of 6 cycles  for chemotherapy treatment.       REVIEW OF SYSTEMS:   .10 Point review of Systems was done is negative except as noted above.   I have reviewed the past medical history, past surgical history, social history  and family history with the patient and they are unchanged from previous note.   PHYSICAL EXAMINATION: ECOG PERFORMANCE STATUS: 1 - Symptomatic but completely ambulatory  .Temp:  [97.4 F (36.3 C)-98 F (36.7 C)] 98 F (36.7 C) (11/17 1423) Pulse Rate:  [56-63] 56 (11/17 1423) Resp:  [18] 18 (11/17 1423) BP: (101-127)/(77-80) 121/80 (11/17 1423) SpO2:  [100 %] 100 % (11/17 1423) Weight:  [134 lb 14.7 oz (61.2 kg)] 134 lb 14.7 oz (61.2 kg) (11/17 0515)  There were no vitals filed for this visit. Filed Weights   04/23/20 0515  Weight: 134 lb 14.7 oz (61.2 kg)    Intake/Output from previous day: 11/16 0701 - 11/17 0700 In: 61 [P.O.:120; IV Piggyback:612] Out: -   GENERAL:alert, in no acute distress and comfortable SKIN: no acute rashes, no significant lesions EYES: conjunctiva are pink and non-injected, sclera anicteric OROPHARYNX: MMM, no exudates, no oropharyngeal erythema or ulceration NECK: supple, no JVD LYMPH:  no palpable lymphadenopathy in the cervical, axillary or inguinal regions LUNGS: clear to auscultation b/l with normal respiratory effort HEART: regular rate & rhythm ABDOMEN:  normoactive bowel sounds , non tender, not distended. Extremity: no pedal edema PSYCH: alert & oriented x 3 with fluent speech NEURO: no focal motor/sensory deficits  LABORATORY DATA:  I have reviewed the data as listed CMP Latest Ref Rng & Units 04/23/2020 04/22/2020 04/21/2020  Glucose 70 - 99 mg/dL 98 96 119(H)  BUN 8 - 23 mg/dL _0 Creatinine  0.61 - 1.24 mg/dL 0.60(L) 0.58(L) 0.65  Sodium 135 - 145 mmol/L 135 135 131(L)  Potassium 3.5 - 5.1 mmol/L 3.7 3.5 4.0  Chloride 98 - 111 mmol/L 104 105 96(L)  CO2 22 - 32 mmol/L 23 20(L) 27  Calcium 8.9 - 10.3 mg/dL 8.9 8.4(L) 8.9  Total Protein 6.5 - 8.1 g/dL 6.5 6.0(L) 6.6  Total Bilirubin 0.3 - 1.2 mg/dL 0.5 0.3 0.6  Alkaline Phos 38 - 126 U/L 65 62 68  AST 15 - 41 U/L _0 ALT 0 - 44 U/L _1 . CBC Latest Ref  Rng & Units 04/23/2020 04/22/2020 04/21/2020  WBC 4.0 - 10.5 K/uL 14.9(H) 14.5(H) 14.0(H)  Hemoglobin 13.0 - 17.0 g/dL 12.3(L) 12.6(L) 13.3  Hematocrit 39 - 52 % 36.2(L) 36.9(L) 39.4  Platelets 150 - 400 K/uL 277 320 278   Component     Latest Ref Rng & Units 04/23/2020  Uric Acid, Serum     3.7 - 8.6 mg/dL 4.1  LDH     98 - 192 U/L 170   MR Brain W Wo Contrast  Result Date: 04/18/2020 CLINICAL DATA:  Headache, new or worsening, Hematologic malignancy, staging Patient with newly diagnosed large B cell lymphoma with new headaches evaluation for CNS involvement EXAM: MRI HEAD WITHOUT AND WITH CONTRAST TECHNIQUE: Multiplanar, multiecho pulse sequences of the brain and surrounding structures were obtained without and with intravenous contrast. CONTRAST:  67m GADAVIST GADOBUTROL 1 MMOL/ML IV SOLN COMPARISON:  None. FINDINGS: Brain: No diffusion-weighted signal abnormality. No intracranial hemorrhage. No midline shift, ventriculomegaly or extra-axial fluid collection. No mass lesion. No abnormal enhancement. Cerebral volume is within normal limits. Minimal chronic microvascular ischemic changes. Vascular: Normal flow voids. Skull and upper cervical spine: Normal marrow signal. Sinuses/Orbits: Normal orbits. Small right maxillary and nasopharyngeal mucous retention cysts. Small right mastoid effusion. Other: None. IMPRESSION: No acute intracranial process. No evidence of metastatic disease. Electronically Signed   By: CPrimitivo GauzeM.D.   On: 04/18/2020 12:01   NM PET Image Initial (PI) Skull Base To Thigh  Result Date: 04/08/2020 CLINICAL DATA:  Initial treatment strategy for reported bulky mediastinal and bilateral hilar lymphadenopathy on recent outside chest CT angiogram study. History of prostate cancer in 2013. EXAM: NUCLEAR MEDICINE PET SKULL BASE TO THIGH TECHNIQUE: 6.7 mCi F-18 FDG was injected intravenously. Full-ring PET imaging was performed from the skull base to thigh after the  radiotracer. CT data was obtained and used for attenuation correction and anatomic localization. Fasting blood glucose: 85 mg/dl COMPARISON:  None. FINDINGS: Mediastinal blood pool activity: SUV max 1.9 Liver activity: SUV max 2.6 NECK: Mildly to moderately enlarged hypermetabolic bilateral neck lymph nodes involving level II on the right and levels III and IV on the left. Representative 0.9 cm right level II neck node with max SUV 15.5 (series 4/image 36). Representative 1.8 cm left level IV/supraclavicular neck node with max SUV 22.6 (series 4/image 40). Incidental CT findings: none CHEST: Mildly enlarged hypermetabolic bilateral axillary lymph nodes, largest 1.6 cm on the left with max SUV 21.5 (series 4/image 55). Bulky hypermetabolic bilateral mediastinal and bilateral hilar lymph nodes. Representative 3.3 cm short axis diameter subcarinal node with max SUV 22.1 (series 4/image 76). Representative 2.1 cm AP window node with max SUV 18.8 (series 4/image 68). Representative high right paratracheal 1.2 cm node near the thoracic inlet with max SUV 20.7 (series 4/image 56). Representative 1.1 cm anterior low paraesophageal node with max SUV 10.8 (series 4/image 100).  Right hilar adenopathy with max SUV 24.9 and left hilar adenopathy with max SUV 20.3. Several necrotic lymph nodes are scattered in the mediastinum and hila, for example measuring 3.4 cm in the high right mediastinum near the thoracic inlet (series 4/image 51). No hypermetabolic pulmonary findings. Incidental CT findings: Left anterior descending and right coronary atherosclerosis. Minimally atherosclerotic nonaneurysmal thoracic aorta. No significant pulmonary nodules. ABDOMEN/PELVIS: Numerous hypermetabolic slightly hypodense masses throughout the spleen, largest 2.1 cm in the inferior spleen with max SUV 12.5 (series 4/image 120). Spleen is overall normal size. Several hypermetabolic enlarged lymph nodes throughout the retroperitoneum involving left  para celiac and aortocaval chains. Representative largely necrotic high left para celiac 3.2 cm node with max SUV 10.0 (series 4/image 113). Splenic hilar 1.2 cm node with max SUV 17.6. Aortocaval 0.7 cm node with max SUV 12.7 (series 4/image 120). No enlarged or hypermetabolic pelvic lymph nodes. No abnormal hypermetabolic activity within the liver, pancreas or adrenal glands. Incidental CT findings: Top-normal size prostate with scattered nonspecific internal prostatic calcifications. SKELETON: No focal hypermetabolic activity to suggest skeletal metastasis. Incidental CT findings: none IMPRESSION: 1. Intensely hypermetabolic bilateral neck, bilateral axillary, bilateral mediastinal, bilateral hilar and retroperitoneal lymphadenopathy. Numerous small hypermetabolic splenic masses. Findings are compatible with malignancy, favoring lymphoproliferative disorder. The most accessible pathologic lymph nodes for potential percutaneous biopsy are likely within the left axilla and left supraclavicular neck. 2. Chronic findings include: Aortic Atherosclerosis (ICD10-I70.0). Coronary atherosclerosis. Electronically Signed   By: Ilona Sorrel M.D.   On: 04/08/2020 13:58   ECHOCARDIOGRAM COMPLETE  Result Date: 04/21/2020    ECHOCARDIOGRAM REPORT   Patient Name:   BLAIR LUNDEEN Date of Exam: 04/21/2020 Medical Rec #:  [id number]     Height:       68.0 in Accession #:    [id number]    Weight:       137.1 lb Date of Birth:  November 17, 1955    BSA:          1.741 m Patient Age:    25 years      BP:           107/70 mmHg Patient Gender: M             HR:           58 bpm. Exam Location:  Inpatient Procedure: 2D Echo STAT ECHO Indications:   chemotherapy evaluation  History:       Patient has no prior history of Echocardiogram examinations.                Cancer.  Sonographer:   Jannett Celestine RDCS (AE) Referring      [place] Phys:  Sonographer Comments: Image acquisition challenging due to patient body habitus.  IMPRESSIONS  1. Left ventricular ejection fraction, by estimation, is 65 to 70%. The left ventricle has normal function. The left ventricle has no regional wall motion abnormalities. Left ventricular diastolic function could not be evaluated.  2. Right ventricular systolic function is normal. The right ventricular size is normal.  3. The mitral valve is normal in structure. Trivial mitral valve regurgitation. No evidence of mitral stenosis.  4. The aortic valve is normal in structure. Aortic valve regurgitation is not visualized. No aortic stenosis is present.  5. The inferior vena cava is normal in size with greater than 50% respiratory variability, suggesting right atrial pressure of 3 mmHg. FINDINGS  Left Ventricle: Left ventricular ejection fraction, by estimation, is 65 to 70%. The left ventricle  has normal function. The left ventricle has no regional wall motion abnormalities. The left ventricular internal cavity size was normal in size. There is  no left ventricular hypertrophy. Left ventricular diastolic function could not be evaluated. Right Ventricle: The right ventricular size is normal. No increase in right ventricular wall thickness. Right ventricular systolic function is normal. Left Atrium: Left atrial size was normal in size. Right Atrium: Right atrial size was normal in size. Pericardium: There is no evidence of pericardial effusion. Mitral Valve: The mitral valve is normal in structure. Trivial mitral valve regurgitation. No evidence of mitral valve stenosis. Tricuspid Valve: The tricuspid valve is normal in structure. Tricuspid valve regurgitation is trivial. No evidence of tricuspid stenosis. Aortic Valve: The aortic valve is normal in structure. Aortic valve regurgitation is not visualized. No aortic stenosis is present. Pulmonic Valve: The pulmonic valve was normal in structure. Pulmonic valve regurgitation is trivial. No evidence of pulmonic stenosis. Aorta: The aortic root is normal in size  and structure. Venous: The inferior vena cava is normal in size with greater than 50% respiratory variability, suggesting right atrial pressure of 3 mmHg. IAS/Shunts: There is right bowing of the interatrial septum, suggestive of elevated left atrial pressure. No atrial level shunt detected by color flow Doppler. Candee Furbish MD Electronically signed by Candee Furbish MD Signature Date/Time: 04/21/2020/11:53:58 AM    Final    [place] CORE BIOPSY (LYMPH NODES)  Result Date: 04/10/2020 INDICATION: Bulky adenopathy, concern for lymphoma EXAM: ULTRASOUND GUIDED CORE BIOPSY OF LEFT SUPRACLAVICULAR ADENOPATHY MEDICATIONS: 1% LIDOCAINE LOCAL ANESTHESIA/SEDATION: Versed 1.16m IV; Fentanyl 538m IV; Moderate Sedation Time:  10 MINUTES The patient was continuously monitored during the procedure by the interventional radiology nurse under my direct supervision. FLUOROSCOPY TIME:  Fluoroscopy Time: None. COMPLICATIONS: None immediate. PROCEDURE: The procedure, risks, benefits, and alternatives were explained to the patient. Questions regarding the procedure were encouraged and answered. The patient understands and consents to the procedure. The left supraclavicular area was prepped with ChloraPrep in a sterile fashion, and a sterile drape was applied covering the operative field. A sterile gown and sterile gloves were used for the procedure. Local anesthesia was provided with 1% Lidocaine. Previous imaging reviewed. Preliminary ultrasound performed. The left supraclavicular bulky adenopathy was localized and marked. Under sterile conditions and local anesthesia, an 18 gauge core biopsy needle was advanced to the supraclavicular adenopathy. 6 18 gauge core biopsies obtained. These were intact and non fragmented. Samples placed in saline. Postprocedure imaging demonstrates no hemorrhage or hematoma. Patient tolerated biopsy well. FINDINGS: Imaging confirms needle placed into the left supraclavicular adenopathy for core biopsy  IMPRESSION: Successful ultrasound left supraclavicular adenopathy 18 gauge core biopsies Electronically Signed   By: M.Jerilynn Mages Shick M.D.   On: 04/10/2020 09:01   IR PICC PLACEMENT RIGHT >5 YRS INC IMG GUIDE  Result Date: 04/21/2020 INDICATION: History of lymphoma. In need of durable intravenous access for the initiation chemotherapy. EXAM: ULTRASOUND AND FLUOROSCOPIC GUIDED PICC LINE INSERTION MEDICATIONS: None. CONTRAST:  Cc Omnipaque 300 FLUOROSCOPY TIME:  18 seconds COMPLICATIONS: None immediate. TECHNIQUE: The procedure, risks, benefits, and alternatives were explained to the patient and informed written consent was obtained. A timeout was performed prior to the initiation of the procedure. The right upper extremity was prepped with chlorhexidine in a sterile fashion, and a sterile drape was applied covering the operative field. Maximum barrier sterile technique with sterile gowns and gloves were used for the procedure. A timeout was performed prior to the initiation of the procedure. Local  anesthesia was provided with 1% lidocaine. Under direct ultrasound guidance, the brachial vein was accessed with a micropuncture kit after the overlying soft tissues were anesthetized with 1% lidocaine. After the overlying soft tissues were anesthetized, a small venotomy incision was created and a micropuncture kit was utilized to access the right brachial vein. Real-time ultrasound guidance was utilized for vascular access including the acquisition of a permanent ultrasound image documenting patency of the accessed vessel. A guidewire was advanced to the level of the level of the axilla and a peel-away sheath was placed. As there was difficulty advancing the microwire centrally, a limited venogram was performed via the micropuncture sheath demonstrating patency of the right upper extremity central venous system. Ultimately, a microwire was advanced to the level of the superior cavoatrial junction and utilized for measurement  purposes. Next, a 38 cm, 5 [place], dual lumen was inserted to level of the superior caval-atrial junction. A post procedure spot fluoroscopic was obtained. The catheter easily aspirated and flushed and was secured in place with stat lock device. A dressing was applied. The patient tolerated the procedure well without immediate post procedural complication. FINDINGS: After catheter placement, the tip lies within the superior cavoatrial junction. The catheter aspirates and flushes normally and is ready for immediate use. IMPRESSION: Successful ultrasound and fluoroscopic guided placement of a right brachial vein approach, cm, 5 French, dual lumen PICC with tip at the superior caval-atrial junction. The PICC line is ready for immediate use. Electronically Signed   By: Sandi Mariscal M.D.   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 Intensely hypermetabolic bilateral neck, bilateral axillary, bilateral mediastinal, bilateral hilar and retroperitoneal lymphadenopathy. Numerous small hypermetabolic splenic masses. Findings are compatible with malignancy, favoring lymphoproliferative disorder." -Admit to inpatient oncology unit for cycle 1 of EPOCH-R.   PLAN -The patient tolerated day 2 of cycle one of his chemotherapy well overall.  He reports mild nausea and constipation this morning.  Continue as needed Compazine and will adjust bowel regimen. -CBC from this morning has been reviewed. CMET, LDH, uric acid are within normal limits . -we will plan to proceed with day 3 of cycle number one of his chemotherapy today  -Continue to check daily CBC with differential, CMET, LDH, uric acid. -Continue allopurinol 100 mg twice a day. -Continue as needed Compazine and will add lorazepam 0.5 mg every 6 hours as needed for nausea or vomiting. -We will schedule MiraLAX daily and add Senokot S  for constipation. -Continue home pain medication. -We will plan for Port-A-Cath placement by IR as an outpatient prior to cycle #2. -The patient will have rituximab and G-CSF as an outpatient 04/28/2020 and will have a lab and follow-up visit for toxicity check on 05/05/2020.  -Started on Marinol yesterday for nausea much better today.   LOS: 2 days   Sullivan Lone, MD MS 04/23/20

## 2020-04-23 NOTE — Progress Notes (Signed)
Chemotherapy dosage and calculations verified with Regina Baldwin, RN. 

## 2020-04-23 NOTE — Progress Notes (Signed)
Chaplain engaged in initial visit with Bryan [name]).  During visit, Bryan [name] shared that he and his family have moved from [place].  He noted that in [place] he did not feel well cared for concerning his diagnosis and that no one seemed to be able to adequately help him.  His family had planned to move to [place] during the new year but through a chance connection with a doctor and referral, it made more sense to go ahead and move.  His wife has family here in [place] and his daughter is now going to school in [place].  Chaplain and Bryan [name] uplifted how things have aligned for him to get the care that he needs and be in a place that can cater to those needs.  Bryan [name] is also surrounded by support.  He noted that he had some anxiety around being in the hospital but that he has been able to recognize that he is in a good place at [facility] and on his current unit.  He values that there seems to be a good plan in place for his medical care.  Chaplain offered the ministries of prayer, presence, and listening with Bryan [name], uplifting his joyous and grateful spirit, and praying for his journey ahead.  Bryan [name] was grateful for chaplain's visit.  Chaplain will follow-up and meet Mrs. Utsey on Friday.      04/23/20 1000  Clinical Encounter Type  Visited With Patient  Visit Type Initial  Referral From Nurse  Consult/Referral To Chaplain  Spiritual Encounters  Spiritual Needs Prayer

## 2020-04-24 ENCOUNTER — Telehealth: Payer: Self-pay | Admitting: *Deleted

## 2020-04-24 ENCOUNTER — Other Ambulatory Visit: Payer: Self-pay | Admitting: *Deleted

## 2020-04-24 DIAGNOSIS — C8338 Diffuse large B-cell lymphoma, lymph nodes of multiple sites: Secondary | ICD-10-CM

## 2020-04-24 DIAGNOSIS — Z5111 Encounter for antineoplastic chemotherapy: Secondary | ICD-10-CM | POA: Diagnosis not present

## 2020-04-24 LAB — CBC WITH DIFFERENTIAL/PLATELET
Abs Immature Granulocytes: 0.06 10*3/uL (ref 0.00–0.07)
Basophils Absolute: 0 10*3/uL (ref 0.0–0.1)
Basophils Relative: 0 %
Eosinophils Absolute: 0 10*3/uL (ref 0.0–0.5)
Eosinophils Relative: 0 %
HCT: 36.6 % — ABNORMAL LOW (ref 39.0–52.0)
Hemoglobin: 12.5 g/dL — ABNORMAL LOW (ref 13.0–17.0)
Immature Granulocytes: 1 %
Lymphocytes Relative: 11 %
Lymphs Abs: 1.2 10*3/uL (ref 0.7–4.0)
MCH: 30.5 pg (ref 26.0–34.0)
MCHC: 34.2 g/dL (ref 30.0–36.0)
MCV: 89.3 fL (ref 80.0–100.0)
Monocytes Absolute: 0.4 10*3/uL (ref 0.1–1.0)
Monocytes Relative: 4 %
Neutro Abs: 9.5 10*3/uL — ABNORMAL HIGH (ref 1.7–7.7)
Neutrophils Relative %: 84 %
Platelets: 256 10*3/uL (ref 150–400)
RBC: 4.1 MIL/uL — ABNORMAL LOW (ref 4.22–5.81)
RDW: 11.8 % (ref 11.5–15.5)
WBC: 11.3 10*3/uL — ABNORMAL HIGH (ref 4.0–10.5)
nRBC: 0 % (ref 0.0–0.2)

## 2020-04-24 LAB — COMPREHENSIVE METABOLIC PANEL
ALT: 31 U/L (ref 0–44)
AST: 25 U/L (ref 15–41)
Albumin: 3.3 g/dL — ABNORMAL LOW (ref 3.5–5.0)
Alkaline Phosphatase: 59 U/L (ref 38–126)
Anion gap: 10 (ref 5–15)
BUN: 17 mg/dL (ref 8–23)
CO2: 24 mmol/L (ref 22–32)
Calcium: 8.9 mg/dL (ref 8.9–10.3)
Chloride: 102 mmol/L (ref 98–111)
Creatinine, Ser: 0.68 mg/dL (ref 0.61–1.24)
GFR, Estimated: >60 mL/min (ref >60)
Glucose, Bld: 90 mg/dL (ref 70–99)
Potassium: 3.6 mmol/L (ref 3.5–5.1)
Sodium: 136 mmol/L (ref 135–145)
Total Bilirubin: 0.9 mg/dL (ref 0.3–1.2)
Total Protein: 6.1 g/dL — ABNORMAL LOW (ref 6.5–8.1)

## 2020-04-24 LAB — URIC ACID: Uric Acid, Serum: 4.2 mg/dL (ref 3.7–8.6)

## 2020-04-24 LAB — LACTATE DEHYDROGENASE: LDH: 155 U/L (ref 98–192)

## 2020-04-24 MED ORDER — ZIEXTENZO 6 MG/0.6ML ~~LOC~~ SOSY: 6.0000 mg | PREFILLED_SYRINGE | Freq: Once | SUBCUTANEOUS | 5 refills | Status: DC

## 2020-04-24 MED ORDER — POLYETHYLENE GLYCOL 3350 17 G PO PACK: 17.0000 g | PACK | Freq: Every day | ORAL | 0 refills | Status: DC | PRN

## 2020-04-24 MED ORDER — VINCRISTINE SULFATE CHEMO INJECTION 1 MG/ML
Freq: Once | INTRAVENOUS | Status: AC
  Filled 2020-04-24: qty 9

## 2020-04-24 MED ORDER — PROCHLORPERAZINE MALEATE 10 MG PO TABS: 10.0000 mg | ORAL_TABLET | Freq: Four times a day (QID) | ORAL | 0 refills | Status: DC | PRN

## 2020-04-24 MED ORDER — DRONABINOL 2.5 MG PO CAPS: 2.5000 mg | ORAL_CAPSULE | Freq: Two times a day (BID) | ORAL | 0 refills | Status: DC

## 2020-04-24 MED ORDER — SODIUM CHLORIDE 0.9 % IV SOLN
Freq: Once | INTRAVENOUS | Status: AC
  Administered 2020-04-24: 8 mg via INTRAVENOUS
  Filled 2020-04-24: qty 4

## 2020-04-24 MED ORDER — SENNOSIDES-DOCUSATE SODIUM 8.6-50 MG PO TABS: 1.0000 | ORAL_TABLET | Freq: Two times a day (BID) | ORAL | Status: AC | PRN

## 2020-04-24 MED ORDER — LORAZEPAM 0.5 MG PO TABS: 0.5000 mg | ORAL_TABLET | Freq: Four times a day (QID) | ORAL | 0 refills | Status: DC | PRN

## 2020-04-24 MED ORDER — ALLOPURINOL 100 MG PO TABS: 100.0000 mg | ORAL_TABLET | Freq: Two times a day (BID) | ORAL | 1 refills | Status: DC

## 2020-04-24 MED ORDER — DEXAMETHASONE 4 MG PO TABS: ORAL_TABLET | ORAL | 1 refills | Status: DC

## 2020-04-24 MED ORDER — ONDANSETRON HCL 8 MG PO TABS: 8.0000 mg | ORAL_TABLET | Freq: Three times a day (TID) | ORAL | 0 refills | Status: DC | PRN

## 2020-04-24 NOTE — Telephone Encounter (Signed)
Connected with [facility] for pharmacist [phone]) number per patient insurance card to obtain Ziextenzo prior authorization.        Per Oley Balm, Ziextenzo does not require prior authorization per this plan.  Unable to locate order, information or notes in patient files. [facility] is the preferred pharmacy for [facility].   Will transfer to provide order."      Advised to contact member services [phone]) per prior auth staff when asked Home Health Nurse agency name to include with order for [facility].      Verbal phone order for Ziextenzo 6mg /0.6 ml prefilled syringe, inject subcutaneously, 48 hours after every 21-day chemotherapy cycle, injection supplies and agency nurse to visit home for injection provided to pharmacist [place].  Mark read order back saying "we send medication, supplies but pharmacy does not do anything about order requests for home health nurse.  Order ready to ship yet patient will need to call [phone] to verify shipping address."   Unable to confirm address. Insurance [place] address of [address] [place] has [place] address both vary from EMR demographics.  Collaborative notified of above information for [facility] and shipment discrepancies.

## 2020-04-24 NOTE — Progress Notes (Addendum)
HEMATOLOGY-ONCOLOGY PROGRESS NOTE  SUBJECTIVE: Continues to tolerate his chemotherapy well overall. Reports some mild nausea without vomiting.  Bowels are moving.  Reports difficulty sleeping at night.  He is trying to ambulate in the hallway.  Oncology History  Diffuse large B cell lymphoma ([place])  04/16/2020 Initial Diagnosis   Diffuse large B cell lymphoma ([place])   04/21/2020 -  Chemotherapy   The patient had dexamethasone (DECADRON) 4 MG tablet, 8 mg, Oral, 2 times daily with meals, 1 of 1 cycle, Start date: --, End date: -- DOXOrubicin (ADRIAMYCIN) 18 mg, etoposide (VEPESID) 86 mg, vinCRIStine (ONCOVIN) 0.7 mg in sodium chloride 0.9 % 1,000 mL chemo infusion, , Intravenous, Once, 1 of 4 cycles Administration:  (04/21/2020),  (04/22/2020),  (04/23/2020) ondansetron (ZOFRAN) 8 mg, dexamethasone (DECADRON) 10 mg in sodium chloride 0.9 % 50 mL IVPB, , Intravenous,  Once, 1 of 4 cycles Administration: 18 mg (04/21/2020), 18 mg (04/22/2020), 8 mg (04/23/2020) cyclophosphamide (CYTOXAN) 1,300 mg in sodium chloride 0.9 % 250 mL chemo infusion, 750 mg/m2 = 1,300 mg, Intravenous,  Once, 1 of 4 cycles  for chemotherapy treatment.    04/28/2020 -  Chemotherapy   The patient had pegfilgrastim-cbqv (UDENYCA) injection 6 mg, 6 mg, Subcutaneous, Once, 0 of 6 cycles riTUXimab-pvvr (RUXIENCE) 600 mg in sodium chloride 0.9 % 250 mL (1.9355 mg/mL) infusion, 375 mg/m2 = 600 mg, Intravenous,  Once, 0 of 6 cycles  for chemotherapy treatment.       REVIEW OF SYSTEMS:   .10 Point review of Systems was done is negative except as noted above.   I have reviewed the past medical history, past surgical history, social history and family history with the patient and they are unchanged from previous note.   PHYSICAL EXAMINATION: ECOG PERFORMANCE STATUS: 1 - Symptomatic but completely ambulatory  .Temp:  [97.5 F (36.4 C)-98 F (36.7 C)] 97.5 F (36.4 C) (11/18 0530) Pulse Rate:  [52-56] 55 (11/18  0530) Resp:  [16-18] 16 (11/18 0530) BP: (117-122)/(72-82) 122/72 (11/18 0530) SpO2:  [100 %] 100 % (11/18 0530)  There were no vitals filed for this visit. Filed Weights   04/23/20 0515  Weight: 61.2 kg    Intake/Output from previous day: 11/17 0701 - 11/18 0700 In: 1222.1 [P.O.:240; I.V.:110; IV Piggyback:872.1] Out: 0   GENERAL:alert, in no acute distress and comfortable SKIN: no acute rashes, no significant lesions EYES: conjunctiva are pink and non-injected, sclera anicteric OROPHARYNX: MMM, no exudates, no oropharyngeal erythema or ulceration NECK: supple, no JVD LYMPH:  no palpable lymphadenopathy in the cervical, axillary or inguinal regions LUNGS: clear to auscultation b/l with normal respiratory effort HEART: regular rate & rhythm ABDOMEN:  normoactive bowel sounds , non tender, not distended. Extremity: no pedal edema PSYCH: alert & oriented x 3 with fluent speech NEURO: no focal motor/sensory deficits  LABORATORY DATA:  I have reviewed the data as listed CMP Latest Ref Rng & Units 04/24/2020 04/23/2020 04/22/2020  Glucose 70 - 99 mg/dL 90 98 96  BUN 8 - 23 mg/dL 17 18 14   Creatinine 0.61 - 1.24 mg/dL 0.68 0.60(L) 0.58(L)  Sodium 135 - 145 mmol/L 136 135 135  Potassium 3.5 - 5.1 mmol/L 3.6 3.7 3.5  Chloride 98 - 111 mmol/L 102 104 105  CO2 22 - 32 mmol/L 24 23 20(L)  Calcium 8.9 - 10.3 mg/dL 8.9 8.9 8.4(L)  Total Protein 6.5 - 8.1 g/dL 6.1(L) 6.5 6.0(L)  Total Bilirubin 0.3 - 1.2 mg/dL 0.9 0.5 0.3  Alkaline Phos 38 -  126 U/L 59 65 62  AST 15 - 41 U/L 25 27 22   ALT 0 - 44 U/L 31 30 25    . CBC Latest Ref Rng & Units 04/24/2020 04/23/2020 04/22/2020  WBC 4.0 - 10.5 K/uL 11.3(H) 14.9(H) 14.5(H)  Hemoglobin 13.0 - 17.0 g/dL 12.5(L) 12.3(L) 12.6(L)  Hematocrit 39 - 52 % 36.6(L) 36.2(L) 36.9(L)  Platelets 150 - 400 K/uL 256 277 320   Component     Latest Ref Rng & Units 04/23/2020  Uric Acid, Serum     3.7 - 8.6 mg/dL 4.1  LDH     98 - 192 U/L 170   MR  Brain W Wo Contrast  Result Date: 04/18/2020 CLINICAL DATA:  Headache, new or worsening, Hematologic malignancy, staging Patient with newly diagnosed large B cell lymphoma with new headaches evaluation for CNS involvement EXAM: MRI HEAD WITHOUT AND WITH CONTRAST TECHNIQUE: Multiplanar, multiecho pulse sequences of the brain and surrounding structures were obtained without and with intravenous contrast. CONTRAST:  26m GADAVIST GADOBUTROL 1 MMOL/ML IV SOLN COMPARISON:  None. FINDINGS: Brain: No diffusion-weighted signal abnormality. No intracranial hemorrhage. No midline shift, ventriculomegaly or extra-axial fluid collection. No mass lesion. No abnormal enhancement. Cerebral volume is within normal limits. Minimal chronic microvascular ischemic changes. Vascular: Normal flow voids. Skull and upper cervical spine: Normal marrow signal. Sinuses/Orbits: Normal orbits. Small right maxillary and nasopharyngeal mucous retention cysts. Small right mastoid effusion. Other: None. IMPRESSION: No acute intracranial process. No evidence of metastatic disease. Electronically Signed   By: CPrimitivo GauzeM.D.   On: 04/18/2020 12:01   NM PET Image Initial (PI) Skull Base To Thigh  Result Date: 04/08/2020 CLINICAL DATA:  Initial treatment strategy for reported bulky mediastinal and bilateral hilar lymphadenopathy on recent outside chest CT angiogram study. History of prostate cancer in 2013. EXAM: NUCLEAR MEDICINE PET SKULL BASE TO THIGH TECHNIQUE: 6.7 mCi F-18 FDG was injected intravenously. Full-ring PET imaging was performed from the skull base to thigh after the radiotracer. CT data was obtained and used for attenuation correction and anatomic localization. Fasting blood glucose: 85 mg/dl COMPARISON:  None. FINDINGS: Mediastinal blood pool activity: SUV max 1.9 Liver activity: SUV max 2.6 NECK: Mildly to moderately enlarged hypermetabolic bilateral neck lymph nodes involving level II on the right and levels III and IV  on the left. Representative 0.9 cm right level II neck node with max SUV 15.5 (series 4/image 36). Representative 1.8 cm left level IV/supraclavicular neck node with max SUV 22.6 (series 4/image 40). Incidental CT findings: none CHEST: Mildly enlarged hypermetabolic bilateral axillary lymph nodes, largest 1.6 cm on the left with max SUV 21.5 (series 4/image 55). Bulky hypermetabolic bilateral mediastinal and bilateral hilar lymph nodes. Representative 3.3 cm short axis diameter subcarinal node with max SUV 22.1 (series 4/image 76). Representative 2.1 cm AP window node with max SUV 18.8 (series 4/image 68). Representative high right paratracheal 1.2 cm node near the thoracic inlet with max SUV 20.7 (series 4/image 56). Representative 1.1 cm anterior low paraesophageal node with max SUV 10.8 (series 4/image 100). Right hilar adenopathy with max SUV 24.9 and left hilar adenopathy with max SUV 20.3. Several necrotic lymph nodes are scattered in the mediastinum and hila, for example measuring 3.4 cm in the high right mediastinum near the thoracic inlet (series 4/image 51). No hypermetabolic pulmonary findings. Incidental CT findings: Left anterior descending and right coronary atherosclerosis. Minimally atherosclerotic nonaneurysmal thoracic aorta. No significant pulmonary nodules. ABDOMEN/PELVIS: Numerous hypermetabolic slightly hypodense masses throughout the spleen, largest 2.1  cm in the inferior spleen with max SUV 12.5 (series 4/image 120). Spleen is overall normal size. Several hypermetabolic enlarged lymph nodes throughout the retroperitoneum involving left para celiac and aortocaval chains. Representative largely necrotic high left para celiac 3.2 cm node with max SUV 10.0 (series 4/image 113). Splenic hilar 1.2 cm node with max SUV 17.6. Aortocaval 0.7 cm node with max SUV 12.7 (series 4/image 120). No enlarged or hypermetabolic pelvic lymph nodes. No abnormal hypermetabolic activity within the liver, pancreas  or adrenal glands. Incidental CT findings: Top-normal size prostate with scattered nonspecific internal prostatic calcifications. SKELETON: No focal hypermetabolic activity to suggest skeletal metastasis. Incidental CT findings: none IMPRESSION: 1. Intensely hypermetabolic bilateral neck, bilateral axillary, bilateral mediastinal, bilateral hilar and retroperitoneal lymphadenopathy. Numerous small hypermetabolic splenic masses. Findings are compatible with malignancy, favoring lymphoproliferative disorder. The most accessible pathologic lymph nodes for potential percutaneous biopsy are likely within the left axilla and left supraclavicular neck. 2. Chronic findings include: Aortic Atherosclerosis (ICD10-I70.0). Coronary atherosclerosis. Electronically Signed   By: Ilona Sorrel M.D.   On: 04/08/2020 13:58   ECHOCARDIOGRAM COMPLETE  Result Date: 04/21/2020    ECHOCARDIOGRAM REPORT   Patient Name:   Bryan [name] Date of Exam: 04/21/2020 Medical Rec #:  [id number]     Height:       68.0 in Accession #:    [id number]    Weight:       137.1 lb Date of Birth:  Dec 31, 1955    BSA:          1.741 m Patient Age:    64 years      BP:           107/70 mmHg Patient Gender: M             HR:           58 bpm. Exam Location:  Inpatient Procedure: 2D Echo STAT ECHO Indications:   chemotherapy evaluation  History:       Patient has no prior history of Echocardiogram examinations.                Cancer.  Sonographer:   Jannett Celestine RDCS (AE) Referring      [place] Phys:  Sonographer Comments: Image acquisition challenging due to patient body habitus. IMPRESSIONS  1. Left ventricular ejection fraction, by estimation, is 65 to 70%. The left ventricle has normal function. The left ventricle has no regional wall motion abnormalities. Left ventricular diastolic function could not be evaluated.  2. Right ventricular systolic function is normal. The right ventricular size is normal.  3. The mitral valve is normal in  structure. Trivial mitral valve regurgitation. No evidence of mitral stenosis.  4. The aortic valve is normal in structure. Aortic valve regurgitation is not visualized. No aortic stenosis is present.  5. The inferior vena cava is normal in size with greater than 50% respiratory variability, suggesting right atrial pressure of 3 mmHg. FINDINGS  Left Ventricle: Left ventricular ejection fraction, by estimation, is 65 to 70%. The left ventricle has normal function. The left ventricle has no regional wall motion abnormalities. The left ventricular internal cavity size was normal in size. There is  no left ventricular hypertrophy. Left ventricular diastolic function could not be evaluated. Right Ventricle: The right ventricular size is normal. No increase in right ventricular wall thickness. Right ventricular systolic function is normal. Left Atrium: Left atrial size was normal in size. Right Atrium: Right atrial size was normal in size. Pericardium:  There is no evidence of pericardial effusion. Mitral Valve: The mitral valve is normal in structure. Trivial mitral valve regurgitation. No evidence of mitral valve stenosis. Tricuspid Valve: The tricuspid valve is normal in structure. Tricuspid valve regurgitation is trivial. No evidence of tricuspid stenosis. Aortic Valve: The aortic valve is normal in structure. Aortic valve regurgitation is not visualized. No aortic stenosis is present. Pulmonic Valve: The pulmonic valve was normal in structure. Pulmonic valve regurgitation is trivial. No evidence of pulmonic stenosis. Aorta: The aortic root is normal in size and structure. Venous: The inferior vena cava is normal in size with greater than 50% respiratory variability, suggesting right atrial pressure of 3 mmHg. IAS/Shunts: There is right bowing of the interatrial septum, suggestive of elevated left atrial pressure. No atrial level shunt detected by color flow Doppler. Candee Furbish MD Electronically signed by Candee Furbish  MD Signature Date/Time: 04/21/2020/11:53:58 AM    Final    [place] CORE BIOPSY (LYMPH NODES)  Result Date: 04/10/2020 INDICATION: Bulky adenopathy, concern for lymphoma EXAM: ULTRASOUND GUIDED CORE BIOPSY OF LEFT SUPRACLAVICULAR ADENOPATHY MEDICATIONS: 1% LIDOCAINE LOCAL ANESTHESIA/SEDATION: Versed 1.33m IV; Fentanyl 544m IV; Moderate Sedation Time:  10 MINUTES The patient was continuously monitored during the procedure by the interventional radiology nurse under my direct supervision. FLUOROSCOPY TIME:  Fluoroscopy Time: None. COMPLICATIONS: None immediate. PROCEDURE: The procedure, risks, benefits, and alternatives were explained to the patient. Questions regarding the procedure were encouraged and answered. The patient understands and consents to the procedure. The left supraclavicular area was prepped with ChloraPrep in a sterile fashion, and a sterile drape was applied covering the operative field. A sterile gown and sterile gloves were used for the procedure. Local anesthesia was provided with 1% Lidocaine. Previous imaging reviewed. Preliminary ultrasound performed. The left supraclavicular bulky adenopathy was localized and marked. Under sterile conditions and local anesthesia, an 18 gauge core biopsy needle was advanced to the supraclavicular adenopathy. 6 18 gauge core biopsies obtained. These were intact and non fragmented. Samples placed in saline. Postprocedure imaging demonstrates no hemorrhage or hematoma. Patient tolerated biopsy well. FINDINGS: Imaging confirms needle placed into the left supraclavicular adenopathy for core biopsy IMPRESSION: Successful ultrasound left supraclavicular adenopathy 18 gauge core biopsies Electronically Signed   By: M.Jerilynn Mages Shick M.D.   On: 04/10/2020 09:01   IR PICC PLACEMENT RIGHT >5 YRS INC IMG GUIDE  Result Date: 04/21/2020 INDICATION: History of lymphoma. In need of durable intravenous access for the initiation chemotherapy. EXAM: ULTRASOUND AND FLUOROSCOPIC  GUIDED PICC LINE INSERTION MEDICATIONS: None. CONTRAST:  Cc Omnipaque 300 FLUOROSCOPY TIME:  18 seconds COMPLICATIONS: None immediate. TECHNIQUE: The procedure, risks, benefits, and alternatives were explained to the patient and informed written consent was obtained. A timeout was performed prior to the initiation of the procedure. The right upper extremity was prepped with chlorhexidine in a sterile fashion, and a sterile drape was applied covering the operative field. Maximum barrier sterile technique with sterile gowns and gloves were used for the procedure. A timeout was performed prior to the initiation of the procedure. Local anesthesia was provided with 1% lidocaine. Under direct ultrasound guidance, the brachial vein was accessed with a micropuncture kit after the overlying soft tissues were anesthetized with 1% lidocaine. After the overlying soft tissues were anesthetized, a small venotomy incision was created and a micropuncture kit was utilized to access the right brachial vein. Real-time ultrasound guidance was utilized for vascular access including the acquisition of a permanent ultrasound image documenting patency of the accessed vessel. A  guidewire was advanced to the level of the level of the axilla and a peel-away sheath was placed. As there was difficulty advancing the microwire centrally, a limited venogram was performed via the micropuncture sheath demonstrating patency of the right upper extremity central venous system. Ultimately, a microwire was advanced to the level of the superior cavoatrial junction and utilized for measurement purposes. Next, a 38 cm, 5 [place], dual lumen was inserted to level of the superior caval-atrial junction. A post procedure spot fluoroscopic was obtained. The catheter easily aspirated and flushed and was secured in place with stat lock device. A dressing was applied. The patient tolerated the procedure well without immediate post procedural complication. FINDINGS:  After catheter placement, the tip lies within the superior cavoatrial junction. The catheter aspirates and flushes normally and is ready for immediate use. IMPRESSION: Successful ultrasound and fluoroscopic guided placement of a right brachial vein approach, cm, 5 French, dual lumen PICC with tip at the superior caval-atrial junction. The PICC line is ready for immediate use. Electronically Signed   By: Sandi Mariscal M.D.   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 Intensely hypermetabolic bilateral neck, bilateral axillary, bilateral mediastinal, bilateral hilar and retroperitoneal lymphadenopathy. Numerous small hypermetabolic splenic masses. Findings are compatible with malignancy, favoring lymphoproliferative disorder." -Admit to inpatient oncology unit for cycle 1 of EPOCH-R.   PLAN -The patient tolerated day 3 of cycle one of his chemotherapy well overall.  He reports mild nausea.  Continue as needed Compazine, Ativan, and scheduled Marinol. -CBC from this morning has been reviewed. CMET, LDH, uric acid are within normal limits . -we will plan to proceed with day 4 of cycle number one of his chemotherapy today  -Continue to check daily CBC with differential, CMET, LDH, uric acid. -Continue allopurinol 100 mg twice a day. -Continue as needed Compazine and lorazepam 0.5 mg every 6 hours as needed, and Marinol 2.5 mg twice daily for nausea or vomiting. -Continue MiraLAX and Senokot-S for constipation. -Continue home pain medication. -We will plan for Port-A-Cath placement by IR as an outpatient prior to cycle #2. -The patient will have rituximab and G-CSF as an outpatient 04/28/2020 and will have a lab and follow-up visit for toxicity check on 05/05/2020.    LOS: 3 days   Mikey Bussing 04/24/20   ADDENDUM  .Patient was Personally and independently  interviewed, examined and relevant elements of the history of present illness were reviewed in details and an assessment and plan was created. All elements of the patient's history of present illness , assessment and plan were discussed in details with Mikey Bussing. The above documentation reflects our combined findings assessment and plan.  Sullivan Lone MD MS

## 2020-04-25 ENCOUNTER — Other Ambulatory Visit: Payer: Self-pay | Admitting: Hematology

## 2020-04-25 ENCOUNTER — Other Ambulatory Visit: Payer: Self-pay | Admitting: Oncology

## 2020-04-25 ENCOUNTER — Other Ambulatory Visit: Payer: Self-pay | Admitting: *Deleted

## 2020-04-25 DIAGNOSIS — C8598 Non-Hodgkin lymphoma, unspecified, lymph nodes of multiple sites: Secondary | ICD-10-CM

## 2020-04-25 DIAGNOSIS — Z5111 Encounter for antineoplastic chemotherapy: Secondary | ICD-10-CM | POA: Diagnosis not present

## 2020-04-25 DIAGNOSIS — C8338 Diffuse large B-cell lymphoma, lymph nodes of multiple sites: Secondary | ICD-10-CM

## 2020-04-25 LAB — COMPREHENSIVE METABOLIC PANEL
ALT: 29 U/L (ref 0–44)
AST: 21 U/L (ref 15–41)
Albumin: 3.3 g/dL — ABNORMAL LOW (ref 3.5–5.0)
Alkaline Phosphatase: 65 U/L (ref 38–126)
Anion gap: 9 (ref 5–15)
BUN: 17 mg/dL (ref 8–23)
CO2: 24 mmol/L (ref 22–32)
Calcium: 8.9 mg/dL (ref 8.9–10.3)
Chloride: 104 mmol/L (ref 98–111)
Creatinine, Ser: 0.64 mg/dL (ref 0.61–1.24)
GFR, Estimated: >60 mL/min (ref >60)
Glucose, Bld: 86 mg/dL (ref 70–99)
Potassium: 3.5 mmol/L (ref 3.5–5.1)
Sodium: 137 mmol/L (ref 135–145)
Total Bilirubin: 0.7 mg/dL (ref 0.3–1.2)
Total Protein: 5.7 g/dL — ABNORMAL LOW (ref 6.5–8.1)

## 2020-04-25 LAB — CBC WITH DIFFERENTIAL/PLATELET
Abs Immature Granulocytes: 0.04 10*3/uL (ref 0.00–0.07)
Basophils Absolute: 0 10*3/uL (ref 0.0–0.1)
Basophils Relative: 1 %
Eosinophils Absolute: 0 10*3/uL (ref 0.0–0.5)
Eosinophils Relative: 0 %
HCT: 36.2 % — ABNORMAL LOW (ref 39.0–52.0)
Hemoglobin: 12.4 g/dL — ABNORMAL LOW (ref 13.0–17.0)
Immature Granulocytes: 1 %
Lymphocytes Relative: 14 %
Lymphs Abs: 1.2 10*3/uL (ref 0.7–4.0)
MCH: 30.2 pg (ref 26.0–34.0)
MCHC: 34.3 g/dL (ref 30.0–36.0)
MCV: 88.3 fL (ref 80.0–100.0)
Monocytes Absolute: 0.2 10*3/uL (ref 0.1–1.0)
Monocytes Relative: 2 %
Neutro Abs: 6.9 10*3/uL (ref 1.7–7.7)
Neutrophils Relative %: 82 %
Platelets: 259 10*3/uL (ref 150–400)
RBC: 4.1 MIL/uL — ABNORMAL LOW (ref 4.22–5.81)
RDW: 11.7 % (ref 11.5–15.5)
WBC: 8.4 10*3/uL (ref 4.0–10.5)
nRBC: 0 % (ref 0.0–0.2)

## 2020-04-25 LAB — LACTATE DEHYDROGENASE: LDH: 153 U/L (ref 98–192)

## 2020-04-25 LAB — URIC ACID: Uric Acid, Serum: 4.1 mg/dL (ref 3.7–8.6)

## 2020-04-25 MED ORDER — ZIEXTENZO 6 MG/0.6ML ~~LOC~~ SOSY: 6.0000 mg | PREFILLED_SYRINGE | Freq: Once | SUBCUTANEOUS | 5 refills | Status: AC

## 2020-04-25 MED ORDER — ACETAMINOPHEN-CODEINE #3 300-30 MG PO TABS: 1.0000 | ORAL_TABLET | ORAL | 0 refills | Status: DC | PRN

## 2020-04-25 MED ORDER — SODIUM CHLORIDE 0.9 % IV SOLN
Freq: Once | INTRAVENOUS | Status: AC
  Administered 2020-04-25: 16 mg via INTRAVENOUS
  Filled 2020-04-25 (×2): qty 8

## 2020-04-25 MED ORDER — SODIUM CHLORIDE 0.9 % IV SOLN
750.0000 mg/m2 | Freq: Once | INTRAVENOUS | Status: AC
  Administered 2020-04-25: 1300 mg via INTRAVENOUS
  Filled 2020-04-25: qty 65

## 2020-04-25 NOTE — Discharge Summary (Addendum)
Discharge Summary  Patient ID: Bryan [name] MRN: [id number] DOB/AGE: 63-Aug-1957 63 y.o.  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 [facility])   Discharged Condition: good  Discharge Labs:   CBC    Component Value Date/Time   WBC 8.4 04/25/2020 0633   RBC 4.10 (L) 04/25/2020 0633   HGB 12.4 (L) 04/25/2020 0633   HCT 36.2 (L) 04/25/2020 0633   PLT 259 04/25/2020 0633   MCV 88.3 04/25/2020 0633   MCH 30.2 04/25/2020 0633   MCHC 34.3 04/25/2020 0633   RDW 11.7 04/25/2020 0633   LYMPHSABS 1.2 04/25/2020 0633   MONOABS 0.2 04/25/2020 0633   EOSABS 0.0 04/25/2020 0633   BASOSABS 0.0 04/25/2020 0633   CMP Latest Ref Rng & Units 04/25/2020 04/24/2020 04/23/2020  Glucose 70 - 99 mg/dL 86 90 98  BUN 8 - 23 mg/dL 17 17 18   Creatinine 0.61 - 1.24 mg/dL 0.64 0.68 0.60(L)  Sodium 135 - 145 mmol/L 137 136 135  Potassium 3.5 - 5.1 mmol/L 3.5 3.6 3.7  Chloride 98 - 111 mmol/L 104 102 104  CO2 22 - 32 mmol/L 24 24 23   Calcium 8.9 - 10.3 mg/dL 8.9 8.9 8.9  Total Protein 6.5 - 8.1 g/dL 5.7(L) 6.1(L) 6.5  Total Bilirubin 0.3 - 1.2 mg/dL 0.7 0.9 0.5  Alkaline Phos 38 - 126 U/L 65 59 65  AST 15 - 41 U/L 21 25 27   ALT 0 - 44 U/L 29 31 30     Significant Diagnostic Studies: None  Consults: None  Procedures: 04/21/2020-echocardiogram showed LVEF of 65 to 70%.  Disposition:  Discharge disposition: 01-Home or Self Care      Allergies as of 04/25/2020   No Known Allergies     Medication List    STOP taking these medications   predniSONE 50 MG tablet Commonly known as: DELTASONE     TAKE these medications   acetaminophen-codeine 300-30 MG tablet Commonly known as: TYLENOL #3 Take 1 tablet by mouth every 4 (four) hours as needed for moderate pain.   allopurinol 100 MG tablet Commonly known  as: ZYLOPRIM Take 1 tablet (100 mg total) by mouth 2 (two) times daily.   dexamethasone 4 MG tablet Commonly known as: DECADRON Take 1 tablet twice a day for 3 days starting the day after each cycle of chemo   dronabinol 2.5 MG capsule Commonly known as: MARINOL Take 1 capsule (2.5 mg total) by mouth 2 (two) times daily before lunch and supper.   LORazepam 0.5 MG tablet Commonly known as: ATIVAN Take 1 tablet (0.5 mg total) by mouth every 6 (six) hours as needed (nausea/vomiting (if prochlorperazine not effective)).   ondansetron 8 MG tablet Commonly known as: ZOFRAN Take 1 tablet (8 mg total) by mouth every 8 (eight) hours as needed for nausea or vomiting.   polyethylene glycol 17 g packet Commonly known as: MIRALAX / GLYCOLAX Take 17 g by mouth daily as needed.   prochlorperazine 10 MG tablet Commonly known as: COMPAZINE Take 1 tablet (10 mg total) by mouth every 6 (six) hours as needed for nausea or vomiting.   senna-docusate 8.6-50 MG tablet Commonly known as: Senokot-S Take 1 tablet by mouth 2 (two) times daily as needed for mild constipation.   THC FREE PO Take 1 tablet by mouth as needed (nausea/vomitting).   Ziextenzo 6  MG/0.6ML injection Generic drug: pegfilgrastim-bmez Inject 0.6 mLs (6 mg total) into the skin once for 1 dose. 24 hours after each cycle of EPOCH. To be administered by Home Health RN arranged by [facility]         HPI:  Bryan [name] is a wonderful 64 y.o. male who has been referred to [place] by Dr. Tammi [name] for evaluation and management of lung mass.  About 6 months ago pt began having abdominal pain after eating. He saw several physicians who could not explain his symptoms.  He then developed extreme discomfort in his chest and red stools. He is unsure if the color of his stools was from blood or diet. Pt then began experiencing left arm pain and pleuritic chest pain, which triggered his Chest CTA in October.  CTA chest ruled out PE but showed  bulky mediastinal and bilateral hilar adenopathy.  He was referred to hematology oncology at [facility] and a PET scan and biopsy were ordered.  There were significant delays in getting these tests completed.  Bryan [name] then establish care here in [place], [place] with hopes of expediting his work-up.  He underwent a PET scan on 04/08/2020 which showed intensely hypermetabolic bilateral neck, bilateral axillary, bilateral mediastinal, bilateral hilar, and retroperitoneal lymphadenopathy, numerous small hypermetabolic splenic masses.  He then had an ultrasound-guided biopsy of a left supraclavicular lymph node which showed diffuse large B-cell lymphoma.  MRI of the brain with and without contrast was performed on 04/18/2020 which showed no acute intracranial process and no evidence for metastatic disease.  It was recommended for the patient to begin systemic chemotherapy with EPOCH-R.   The patient was seen on the day of admission prior to cycle number one of his chemotherapy.  On admission, he had ongoing fatigue and arthralgias.  He was using Tylenol 3 4-6 times per day for arthralgias.  On admission, he did not have any fevers but was reporting chills.  He did not have any chest pain, shortness of breath, abdominal pain.  He had some mild nausea without vomiting on admission.  A PICC line was placed by IR on the day of admission for chemotherapy administration.  Echocardiogram was performed on the day of admission which showed an LVEF of 65 to 70%.  The patient was admitted for cycle #1 of chemotherapy.  Hospital Course: Bryan [name] started his chemotherapy as planned on the day of admission.  He overall tolerated his chemotherapy well with the exception of grade 1 nausea.  No vomiting was reported.  Nausea was overall controlled with as needed Compazine.  He did not have any mucositis.  He developed constipation was placed on scheduled MiraLAX and Senokot-S 1 tablet twice a day with improvement of his  constipation.    On the day of discharge, nausea continues to be overall controlled.  No vomiting or mucositis today.  Pain continues to be controlled with Tylenol No. 3 and he notices that he is taking less of this.  Constipation controlled with current bowel regimen.  He has no fevers, chills, chest pain, shortness of breath.  The patient is stable for discharge once chemotherapy administration is complete and PICC line has been discontinued.  Physical Exam Constitutional:      General: He is not in acute distress.    Appearance: Normal appearance.  HENT:     Head: Normocephalic and atraumatic.     Nose: Nose normal.  Eyes:     Extraocular Movements: Extraocular movements intact.  Conjunctiva/sclera: Conjunctivae normal.     Pupils: Pupils are equal, round, and reactive to light.  Cardiovascular:     Rate and Rhythm: Normal rate and regular rhythm.     Pulses: Normal pulses.     Heart sounds: Normal heart sounds.  Pulmonary:     Effort: Pulmonary effort is normal.     Breath sounds: Normal breath sounds.  Abdominal:     General: Abdomen is flat. Bowel sounds are normal.     Palpations: Abdomen is soft.  Musculoskeletal:        General: Normal range of motion.  Skin:    General: Skin is warm and dry.  Neurological:     General: No focal deficit present.     Mental Status: He is alert and oriented to person, place, and time. Mental status is at baseline.  Psychiatric:        Mood and Affect: Mood normal.        Behavior: Behavior normal.        Thought Content: Thought content normal.        Judgment: Judgment normal.     Discharge Instructions    Activity as tolerated - No restrictions   Complete by: As directed    Diet general   Complete by: As directed    No wound care   Complete by: As directed       The patient will go to the [facility] cancer center on 11/22 for Rituxan.  He will receive G-CSF at home on 11/22.  Labs and follow-up visit are scheduled on  05/05/2020.  We will plan for Port-A-Cath placement as an outpatient prior to cycle #2.  Signed: Mikey Bussing 04/25/2020, 10:27 AM    ADDENDUM  Patient was Personally and independently interviewed, examined and relevant elements of the discharge plan were reviewed in details and an assessment and plan was created. All elements of the patient's discharge plan were discussed in details with Mikey Bussing. The above documentation reflects our combined findings assessment and plan.  Sullivan Lone MD MS TT spent discharging patient>30 mins

## 2020-04-28 ENCOUNTER — Encounter (HOSPITAL_COMMUNITY): Payer: Self-pay | Admitting: Hematology

## 2020-04-28 ENCOUNTER — Telehealth: Payer: Self-pay | Admitting: *Deleted

## 2020-04-28 ENCOUNTER — Inpatient Hospital Stay: Payer: 59

## 2020-04-28 ENCOUNTER — Other Ambulatory Visit: Payer: Self-pay | Admitting: *Deleted

## 2020-04-28 ENCOUNTER — Other Ambulatory Visit: Payer: Self-pay

## 2020-04-28 VITALS — BP 101/61 | HR 65 | Temp 97.8°F | Resp 17

## 2020-04-28 DIAGNOSIS — Z8546 Personal history of malignant neoplasm of prostate: Secondary | ICD-10-CM | POA: Diagnosis not present

## 2020-04-28 DIAGNOSIS — Z7189 Other specified counseling: Secondary | ICD-10-CM

## 2020-04-28 DIAGNOSIS — C8338 Diffuse large B-cell lymphoma, lymph nodes of multiple sites: Secondary | ICD-10-CM

## 2020-04-28 DIAGNOSIS — C8598 Non-Hodgkin lymphoma, unspecified, lymph nodes of multiple sites: Secondary | ICD-10-CM | POA: Diagnosis not present

## 2020-04-28 LAB — CBC WITH DIFFERENTIAL/PLATELET
Abs Immature Granulocytes: 0.04 10*3/uL (ref 0.00–0.07)
Basophils Absolute: 0 10*3/uL (ref 0.0–0.1)
Basophils Relative: 1 %
Eosinophils Absolute: 0.3 10*3/uL (ref 0.0–0.5)
Eosinophils Relative: 3 %
HCT: 40.7 % (ref 39.0–52.0)
Hemoglobin: 14.4 g/dL (ref 13.0–17.0)
Immature Granulocytes: 1 %
Lymphocytes Relative: 14 %
Lymphs Abs: 1.2 10*3/uL (ref 0.7–4.0)
MCH: 30.5 pg (ref 26.0–34.0)
MCHC: 35.4 g/dL (ref 30.0–36.0)
MCV: 86.2 fL (ref 80.0–100.0)
Monocytes Absolute: 0.1 10*3/uL (ref 0.1–1.0)
Monocytes Relative: 1 %
Neutro Abs: 7.2 10*3/uL (ref 1.7–7.7)
Neutrophils Relative %: 80 %
Platelets: 300 10*3/uL (ref 150–400)
RBC: 4.72 MIL/uL (ref 4.22–5.81)
RDW: 11.3 % — ABNORMAL LOW (ref 11.5–15.5)
WBC: 8.8 10*3/uL (ref 4.0–10.5)
nRBC: 0 % (ref 0.0–0.2)

## 2020-04-28 LAB — CMP (CANCER CENTER ONLY)
ALT: 40 U/L (ref 0–44)
AST: 21 U/L (ref 15–41)
Albumin: 4.1 g/dL (ref 3.5–5.0)
Alkaline Phosphatase: 75 U/L (ref 38–126)
Anion gap: 12 (ref 5–15)
BUN: 17 mg/dL (ref 8–23)
CO2: 22 mmol/L (ref 22–32)
Calcium: 10.1 mg/dL (ref 8.9–10.3)
Chloride: 100 mmol/L (ref 98–111)
Creatinine: 0.76 mg/dL (ref 0.61–1.24)
GFR, Estimated: >60 mL/min (ref >60)
Glucose, Bld: 107 mg/dL — ABNORMAL HIGH (ref 70–99)
Potassium: 3.8 mmol/L (ref 3.5–5.1)
Sodium: 134 mmol/L — ABNORMAL LOW (ref 135–145)
Total Bilirubin: 0.6 mg/dL (ref 0.3–1.2)
Total Protein: 6.9 g/dL (ref 6.5–8.1)

## 2020-04-28 MED ORDER — DIPHENHYDRAMINE HCL 25 MG PO CAPS
ORAL_CAPSULE | ORAL | Status: AC
  Filled 2020-04-28: qty 2

## 2020-04-28 MED ORDER — DIPHENHYDRAMINE HCL 25 MG PO CAPS
50.0000 mg | ORAL_CAPSULE | Freq: Once | ORAL | Status: AC
  Administered 2020-04-28: 50 mg via ORAL

## 2020-04-28 MED ORDER — MONTELUKAST SODIUM 10 MG PO TABS
10.0000 mg | ORAL_TABLET | Freq: Once | ORAL | Status: AC
  Administered 2020-04-28: 10 mg via ORAL
  Filled 2020-04-28: qty 1

## 2020-04-28 MED ORDER — MONTELUKAST SODIUM 10 MG PO TABS
10.0000 mg | ORAL_TABLET | Freq: Every day | ORAL | Status: DC
  Filled 2020-04-28: qty 1

## 2020-04-28 MED ORDER — ACETAMINOPHEN 325 MG PO TABS
650.0000 mg | ORAL_TABLET | Freq: Once | ORAL | Status: AC
  Administered 2020-04-28: 650 mg via ORAL

## 2020-04-28 MED ORDER — PEGFILGRASTIM-CBQV 6 MG/0.6ML ~~LOC~~ SOSY: 6.0000 mg | PREFILLED_SYRINGE | Freq: Once | SUBCUTANEOUS | Status: DC

## 2020-04-28 MED ORDER — ACETAMINOPHEN 325 MG PO TABS
ORAL_TABLET | ORAL | Status: AC
  Filled 2020-04-28: qty 2

## 2020-04-28 MED ORDER — FAMOTIDINE IN NACL 20-0.9 MG/50ML-% IV SOLN
20.0000 mg | Freq: Once | INTRAVENOUS | Status: AC
  Administered 2020-04-28: 20 mg via INTRAVENOUS

## 2020-04-28 MED ORDER — FAMOTIDINE IN NACL 20-0.9 MG/50ML-% IV SOLN
INTRAVENOUS | Status: AC
  Filled 2020-04-28: qty 50

## 2020-04-28 MED ORDER — SODIUM CHLORIDE 0.9 % IV SOLN
Freq: Once | INTRAVENOUS | Status: AC
  Filled 2020-04-28: qty 250

## 2020-04-28 MED ORDER — SODIUM CHLORIDE 0.9 % IV SOLN
375.0000 mg/m2 | Freq: Once | INTRAVENOUS | Status: AC
  Administered 2020-04-28: 600 mg via INTRAVENOUS
  Filled 2020-04-28: qty 10

## 2020-04-28 MED ORDER — METHYLPREDNISOLONE SODIUM SUCC 125 MG IJ SOLR
80.0000 mg | Freq: Every day | INTRAMUSCULAR | Status: DC
  Administered 2020-04-28: 80 mg via INTRAVENOUS

## 2020-04-28 MED ORDER — METHYLPREDNISOLONE SODIUM SUCC 40 MG IJ SOLR
INTRAMUSCULAR | Status: AC
  Filled 2020-04-28: qty 1

## 2020-04-28 NOTE — Telephone Encounter (Signed)
Spoke with [name] (wife). Reviewed all medications patient has at home. Patient experienced some nausea over weekend and was able to decrease it with ondansetron/compazine. They received injection (Pefilgratism) from specialty pharmacy this weekend. Ms. Migues will administer to patient today - states she spoke with nurse from pharmacy and from Access Nurse ([facility] afterhours nurse triage service) over weekend to clarify administration. Ms. Rumbold states the nurse at [facility] infusion room said she would also review administration with wife and patient when today's infusion is complete. Encouraged to contact office for questions.

## 2020-04-28 NOTE — Patient Instructions (Addendum)
[facility] Discharge Instructions for Patients Receiving Chemotherapy  Today you received the following chemotherapy agents Rituximab  To help prevent nausea and vomiting after your treatment, we encourage you to take your nausea medication as prescribed by MD.   If you develop nausea and vomiting that is not controlled by your nausea medication, call the clinic.   BELOW ARE SYMPTOMS THAT SHOULD BE REPORTED IMMEDIATELY:  *FEVER GREATER THAN 100.5 F  *CHILLS WITH OR WITHOUT FEVER  NAUSEA AND VOMITING THAT IS NOT CONTROLLED WITH YOUR NAUSEA MEDICATION  *UNUSUAL SHORTNESS OF BREATH  *UNUSUAL BRUISING OR BLEEDING  TENDERNESS IN MOUTH AND THROAT WITH OR WITHOUT PRESENCE OF ULCERS  *URINARY PROBLEMS  *BOWEL PROBLEMS  UNUSUAL RASH Items with * indicate a potential emergency and should be followed up as soon as possible.  Feel free to call the clinic should you have any questions or concerns. The clinic phone number is [phone].  Please show the [facility] at check-in to the Emergency Department and triage nurse.  Methylprednisolone Solution for Injection What is this medicine? METHYLPREDNISOLONE (meth ill pred NISS oh lone) is a corticosteroid. It is commonly used to treat inflammation of the skin, joints, lungs, and other organs. Common conditions treated include asthma, allergies, and arthritis. It is also used for other conditions, such as blood disorders and diseases of the adrenal glands. This medicine may be used for other purposes; ask your health care provider or pharmacist if you have questions. COMMON BRAND NAME(S): A-Methapred, Solu-Medrol What should I tell my health care provider before I take this medicine? They need to know if you have any of these conditions:  Cushing's syndrome  eye disease, vision problems  diabetes  glaucoma  heart disease  high blood pressure  infection (especially a virus infection such as chickenpox, cold  sores, or herpes)  liver disease  mental illness  myasthenia gravis  osteoporosis  recently received or scheduled to receive a vaccine  seizures  stomach or intestine problems  thyroid disease  an unusual or allergic reaction to lactose, methylprednisolone, other medicines, foods, dyes, or preservatives  pregnant or trying to get pregnant  breast-feeding How should I use this medicine? This medicine is for injection or infusion into a vein. It is also for injection into a muscle. It is given by a health care professional in a hospital or clinic setting. Talk to your pediatrician regarding the use of this medicine in children. While this drug may be prescribed for selected conditions, precautions do apply. Overdosage: If you think you have taken too much of this medicine contact a poison control center or emergency room at once. NOTE: This medicine is only for you. Do not share this medicine with others. What if I miss a dose? This does not apply. What may interact with this medicine? Do not take this medicine with any of the following medications:  alefacept  echinacea  iopamidol  live virus vaccines  metyrapone  mifepristone This medicine may also interact with the following medications:  amphotericin B  aspirin and aspirin-like medicines  certain antibiotics like erythromycin, clarithromycin, troleandomycin  certain medicines for diabetes  certain medicines for fungal infection like ketoconazole  certain medicines for seizures like carbamazepine, phenobarbital, phenytoin  certain medicines that treat or prevent blood clots like warfarin  cyclosporine  digoxin  diuretics  male hormones, like estrogens and birth control pills  isoniazid  NSAIDS, medicines for pain and inflammation, like ibuprofen or naproxen  other medicines for  myasthenia gravis  rifampin  vaccines This list may not describe all possible interactions. Give your health  care provider a list of all the medicines, herbs, non-prescription drugs, or dietary supplements you use. Also tell them if you smoke, drink alcohol, or use illegal drugs. Some items may interact with your medicine. What should I watch for while using this medicine? Tell your doctor or healthcare professional if your symptoms do not start to get better or if they get worse. Do not stop taking except on your doctor's advice. You may develop a severe reaction. Your doctor will tell you how much medicine to take. Your condition will be monitored carefully while you are receiving this medicine. This medicine may increase your risk of getting an infection. Tell your doctor or health care professional if you are around anyone with measles or chickenpox, or if you develop sores or blisters that do not heal properly. This medicine may increase blood sugar. Ask your healthcare provider if changes in diet or medicines are needed if you have diabetes. Tell your doctor or health care professional right away if you have any change in your eyesight. Using this medicine for a long time may increase your risk of low bone mass. Talk to your doctor about bone health. What side effects may I notice from receiving this medicine? Side effects that you should report to your doctor or health care professional as soon as possible:  allergic reactions like skin rash, itching or hives, swelling of the face, lips, or tongue  bloody or tarry stools  hallucination, loss of contact with reality  muscle cramps  muscle pain  palpitations  signs and symptoms of high blood sugar such as being more thirsty or hungry or having to urinate more than normal. You may also feel very tired or have blurry vision.  signs and symptoms of infection like fever or chills; cough; sore throat; pain or trouble passing urine  trouble passing urine Side effects that usually do not require medical attention (report to your doctor or health  care professional if they continue or are bothersome):  changes in emotions or mood  constipation  diarrhea  excessive hair growth on the face or body  headache  nausea, vomiting  pain, redness, or irritation at site where injected  trouble sleeping  weight gain This list may not describe all possible side effects. Call your doctor for medical advice about side effects. You may report side effects to FDA at 1-800-FDA-1088. Where should I keep my medicine? This drug is given in a hospital or clinic and will not be stored at home. NOTE: This sheet is a summary. It may not cover all possible information. If you have questions about this medicine, talk to your doctor, pharmacist, or health care provider.  2020 Elsevier/Gold Standard (2018-02-23 09:12:19) Famotidine injection What is this medicine? FAMOTIDINE (fa MOE ti deen) is a type of antihistamine that blocks the release of stomach acid. It is used to treat stomach or intestinal ulcers. It can relieve ulcer pain and discomfort, and the heartburn from acid reflux. This medicine may be used for other purposes; ask your health care provider or pharmacist if you have questions. COMMON BRAND NAME(S): Pepcid What should I tell my health care provider before I take this medicine? They need to know if you have any of these conditions:  kidney or liver disease  an unusual or allergic reaction to famotidine, other medicines, foods, dyes, or preservatives  pregnant or trying to get pregnant  breast-feeding How should I use this medicine? This medicine is for infusion into a vein. It is given by a health care professional in a hospital or clinic setting. Talk to your pediatrician regarding the use of this medicine in children. Special care may be needed. Overdosage: If you think you have taken too much of this medicine contact a poison control center or emergency room at once. NOTE: This medicine is only for you. Do not share this  medicine with others. What if I miss a dose? This does not apply. What may interact with this medicine?  delavirdine  itraconazole  ketoconazole This list may not describe all possible interactions. Give your health care provider a list of all the medicines, herbs, non-prescription drugs, or dietary supplements you use. Also tell them if you smoke, drink alcohol, or use illegal drugs. Some items may interact with your medicine. What should I watch for while using this medicine? Tell your doctor or health care professional if your condition does not start to get better or gets worse. Do not take with aspirin, ibuprofen, or other antiinflammatory medicines. These can aggravate your condition. Do not smoke cigarettes or drink alcohol. These increase irritation in your stomach and can increase the time it will take for ulcers to heal. Cigarettes and alcohol can also worsen acid reflux or heartburn. If you get black, tarry stools or vomit up what looks like coffee grounds, call your doctor or health care professional at once. You may have a bleeding ulcer. This medicine may cause a decrease in vitamin B12. You should make sure that you get enough vitamin B12 while you are taking this medicine. Discuss the foods you eat and the vitamins you take with your health care professional. What side effects may I notice from receiving this medicine? Side effects that you should report to your doctor or health care professional as soon as possible:  allergic reactions like skin rash, itching or hives, swelling of the face, lips, or tongue  agitation, nervousness  confusion  hallucinations Side effects that usually do not require medical attention (report to your doctor or health care professional if they continue or are bothersome):  constipation  diarrhea  dizziness  headache This list may not describe all possible side effects. Call your doctor for medical advice about side effects. You may  report side effects to FDA at 1-800-FDA-1088. Where should I keep my medicine? This medicine is given in a hospital or clinic. You will not be given this medicine to store at home. NOTE: This sheet is a summary. It may not cover all possible information. If you have questions about this medicine, talk to your doctor, pharmacist, or health care provider.  2020 Elsevier/Gold Standard (2017-01-07 13:16:46) Montelukast oral tablets What is this medicine? MONTELUKAST (mon te LOO kast) is used to prevent and treat the symptoms of asthma. It is also used to treat allergies. Do not use for an acute asthma attack. This medicine may be used for other purposes; ask your health care provider or pharmacist if you have questions. COMMON BRAND NAME(S): Singulair What should I tell my health care provider before I take this medicine? They need to know if you have any of these conditions:  liver disease  an unusual or allergic reaction to montelukast, other medicines, foods, dyes, or preservatives  pregnant or trying to get pregnant  breast-feeding How should I use this medicine? This medicine should be given by mouth. Follow the directions on the prescription label. Take this  medicine at the same time every day. You may take this medicine with or without meals. Do not chew the tablets. Do not stop taking your medicine unless your doctor tells you to. Talk to your pediatrician regarding the use of this medicine in children. Special care may be needed. While this drug may be prescribed for children as young as 30 years of age for selected conditions, precautions do apply. Overdosage: If you think you have taken too much of this medicine contact a poison control center or emergency room at once. NOTE: This medicine is only for you. Do not share this medicine with others. What if I miss a dose? If you miss a dose, skip it. Take your next dose at the normal time. Do not take extra or 2 doses at the same time  to make up for the missed dose. What may interact with this medicine?  anti-infectives like rifampin and rifabutin  medicines for seizures like phenytoin, phenobarbital, and carbamazepine This list may not describe all possible interactions. Give your health care provider a list of all the medicines, herbs, non-prescription drugs, or dietary supplements you use. Also tell them if you smoke, drink alcohol, or use illegal drugs. Some items may interact with your medicine. What should I watch for while using this medicine? Visit your doctor or health care professional for regular checks on your progress. Tell your doctor or health care professional if your allergy or asthma symptoms do not improve. Take your medicine even when you do not have symptoms. Do not stop taking any of your medicine(s) unless your doctor tells you to. If you have asthma, talk to your doctor about what to do in an acute asthma attack. Always have your inhaled rescue medicine for asthma attacks with you. Patients and their families should watch for new or worsening thoughts of suicide or depression. Also watch for sudden changes in feelings such as feeling anxious, agitated, panicky, irritable, hostile, aggressive, impulsive, severely restless, overly excited and hyperactive, or not being able to sleep. Any worsening of mood or thoughts of suicide or dying should be reported to your health care professional right away. What side effects may I notice from receiving this medicine? Side effects that you should report to your doctor or health care professional as soon as possible:  allergic reactions like skin rash or hives, or swelling of the face, lips, or tongue  breathing problems  changes in emotions or moods  confusion  depressed mood  fever or infection  hallucinations  joint pain  painful lumps under the skin  pain, tingling, numbness in the hands or feet  redness, blistering, peeling, or loosening of the  skin, including inside the mouth  restlessness  seizures  sleep walking  signs and symptoms of infection like fever; chills; cough; sore throat; flu-like illness  signs and symptoms of liver injury like dark yellow or brown urine; general ill feeling or flu-like symptoms; light-colored stools; loss of appetite; nausea; right upper belly pain; unusually weak or tired; yellowing of the eyes or skin  sinus pain or swelling  stuttering  suicidal thoughts or other mood changes  tremors  trouble sleeping  uncontrolled muscle movements  unusual bleeding or bruising  vivid or bad dreams Side effects that usually do not require medical attention (report to your doctor or health care professional if they continue or are bothersome):  dizziness  drowsiness  headache  runny nose  stomach upset  tiredness This list may not describe all possible  side effects. Call your doctor for medical advice about side effects. You may report side effects to FDA at 1-800-FDA-1088. Where should I keep my medicine? Keep out of the reach of children. Store at room temperature between 15 and 30 degrees C (59 and 86 degrees F). Protect from light and moisture. Keep this medicine in the original bottle. Throw away any unused medicine after the expiration date. NOTE: This sheet is a summary. It may not cover all possible information. If you have questions about this medicine, talk to your doctor, pharmacist, or health care provider.  2020 Elsevier/Gold Standard (2018-09-22 12:54:33) Diphenhydramine capsules or tablets What is this medicine? DIPHENHYDRAMINE (dye fen HYE dra meen) is an antihistamine. It is used to treat the symptoms of an allergic reaction. It is also used to treat Parkinson's disease. This medicine is also used to prevent and to treat motion sickness and as a nighttime sleep aid. This medicine may be used for other purposes; ask your health care provider or pharmacist if you have  questions. COMMON BRAND NAME(S): Alka-Seltzer Plus Allergy, Aller-G-Time, Banophen, Benadryl Allergy, Benadryl Allergy Dye Free, Benadryl Allergy Kapgel, Benadryl Allergy Ultratab, Diphedryl, Diphenhist, Genahist, Geri-Dryl, PHARBEDRYL, Q-Dryl, Gretta Began, Valu-Dryl, Vicks ZzzQuil Nightime Sleep-Aid What should I tell my health care provider before I take this medicine? They need to know if you have any of these conditions:  asthma or lung disease  glaucoma  high blood pressure or heart disease  liver disease  pain or difficulty passing urine  prostate trouble  ulcers or other stomach problems  an unusual or allergic reaction to diphenhydramine, other medicines foods, dyes, or preservatives such as sulfites  pregnant or trying to get pregnant  breast-feeding How should I use this medicine? Take this medicine by mouth with a full glass of water. Follow the directions on the prescription label. Take your doses at regular intervals. Do not take your medicine more often than directed. To prevent motion sickness start taking this medicine 30 to 60 minutes before you leave. Talk to your pediatrician regarding the use of this medicine in children. Special care may be needed. Patients over 41 years old may have a stronger reaction and need a smaller dose. Overdosage: If you think you have taken too much of this medicine contact a poison control center or emergency room at once. NOTE: This medicine is only for you. Do not share this medicine with others. What if I miss a dose? If you miss a dose, take it as soon as you can. If it is almost time for your next dose, take only that dose. Do not take double or extra doses. What may interact with this medicine? Do not take this medicine with any of the following medications:  MAOIs like Carbex, Eldepryl, Marplan, Nardil, and Parnate This medicine may also interact with the following medications:  alcohol  barbiturates, like  phenobarbital  medicines for bladder spasm like oxybutynin, tolterodine  medicines for blood pressure  medicines for depression, anxiety, or psychotic disturbances  medicines for movement abnormalities or Parkinson's disease  medicines for sleep  other medicines for cold, cough or allergy  some medicines for the stomach like chlordiazepoxide, dicyclomine This list may not describe all possible interactions. Give your health care provider a list of all the medicines, herbs, non-prescription drugs, or dietary supplements you use. Also tell them if you smoke, drink alcohol, or use illegal drugs. Some items may interact with your medicine. What should I watch for while using this medicine? Visit  your doctor or health care professional for regular check ups. Tell your doctor if your symptoms do not improve or if they get worse. Your mouth may get dry. Chewing sugarless gum or sucking hard candy, and drinking plenty of water may help. Contact your doctor if the problem does not go away or is severe. This medicine may cause dry eyes and blurred vision. If you wear contact lenses you may feel some discomfort. Lubricating drops may help. See your eye doctor if the problem does not go away or is severe. You may get drowsy or dizzy. Do not drive, use machinery, or do anything that needs mental alertness until you know how this medicine affects you. Do not stand or sit up quickly, especially if you are an older patient. This reduces the risk of dizzy or fainting spells. Alcohol may interfere with the effect of this medicine. Avoid alcoholic drinks. What side effects may I notice from receiving this medicine? Side effects that you should report to your doctor or health care professional as soon as possible:  allergic reactions like skin rash, itching or hives, swelling of the face, lips, or tongue  changes in vision  confused, agitated, nervous  irregular or fast heartbeat  tremor  trouble  passing urine  unusual bleeding or bruising  unusually weak or tired Side effects that usually do not require medical attention (report to your doctor or health care professional if they continue or are bothersome):  constipation, diarrhea  drowsy  headache  loss of appetite  stomach upset, vomiting  thick mucous This list may not describe all possible side effects. Call your doctor for medical advice about side effects. You may report side effects to FDA at 1-800-FDA-1088. Where should I keep my medicine? Keep out of the reach of children. This medicine can be abused. Keep your medicine in a safe place. Store at room temperature between 15 and 30 degrees C (59 and 86 degrees F). Keep container closed tightly. Throw away any unused medicine after the expiration date. NOTE: This sheet is a summary. It may not cover all possible information. If you have questions about this medicine, talk to your doctor, pharmacist, or health care provider.  2020 Elsevier/Gold Standard (2019-03-02 10:18:35) Acetaminophen tablets or caplets What is this medicine? ACETAMINOPHEN (a set a MEE noe fen) is a pain reliever. It is used to treat mild pain and fever. This medicine may be used for other purposes; ask your health care provider or pharmacist if you have questions. COMMON BRAND NAME(S): Aceta, Actamin, Anacin Aspirin Free, Genapap, Genebs, Mapap, Pain & Fever, Pain and Fever, PAIN RELIEF, PAIN RELIEF Extra Strength, Pain Reliever, Panadol, PHARBETOL, Q-Pap, Q-Pap Extra Strength, Tylenol, Tylenol CrushableTablet, Tylenol Extra Strength, XS No Aspirin, XS Pain Reliever What should I tell my health care provider before I take this medicine? They need to know if you have any of these conditions:  if you often drink alcohol  liver disease  an unusual or allergic reaction to acetaminophen, other medicines, foods, dyes, or preservatives  pregnant or trying to get pregnant  breast-feeding How  should I use this medicine? Take this medicine by mouth with a glass of water. Follow the directions on the package or prescription label. Take your medicine at regular intervals. Do not take your medicine more often than directed. Talk to your pediatrician regarding the use of this medicine in children. While this drug may be prescribed for children as young as 67 years of age for selected conditions,  precautions do apply. Overdosage: If you think you have taken too much of this medicine contact a poison control center or emergency room at once. NOTE: This medicine is only for you. Do not share this medicine with others. What if I miss a dose? If you miss a dose, take it as soon as you can. If it is almost time for your next dose, take only that dose. Do not take double or extra doses. What may interact with this medicine?  alcohol  imatinib  isoniazid  other medicines with acetaminophen This list may not describe all possible interactions. Give your health care provider a list of all the medicines, herbs, non-prescription drugs, or dietary supplements you use. Also tell them if you smoke, drink alcohol, or use illegal drugs. Some items may interact with your medicine. What should I watch for while using this medicine? Tell your doctor or health care professional if the pain lasts more than 10 days (5 days for children), if it gets worse, or if there is a new or different kind of pain. Also, check with your doctor if a fever lasts for more than 3 days. Do not take other medicines that contain acetaminophen with this medicine. Always read labels carefully. If you have questions, ask your doctor or pharmacist. If you take too much acetaminophen get medical help right away. Too much acetaminophen can be very dangerous and cause liver damage. Even if you do not have symptoms, it is important to get help right away. What side effects may I notice from receiving this medicine? Side effects that you  should report to your doctor or health care professional as soon as possible:  allergic reactions like skin rash, itching or hives, swelling of the face, lips, or tongue  breathing problems  fever or sore throat  redness, blistering, peeling or loosening of the skin, including inside the mouth  trouble passing urine or change in the amount of urine  unusual bleeding or bruising  unusually weak or tired  yellowing of the eyes or skin Side effects that usually do not require medical attention (report to your doctor or health care professional if they continue or are bothersome):  headache  nausea, stomach upset This list may not describe all possible side effects. Call your doctor for medical advice about side effects. You may report side effects to FDA at 1-800-FDA-1088. Where should I keep my medicine? Keep out of reach of children. Store at room temperature between 20 and 25 degrees C (68 and 77 degrees F). Protect from moisture and heat. Throw away any unused medicine after the expiration date. NOTE: This sheet is a summary. It may not cover all possible information. If you have questions about this medicine, talk to your doctor, pharmacist, or health care provider.  2020 [employer] (2013-01-15 12:54:16) Rituximab injection What is this medicine? RITUXIMAB (ri TUX i mab) is a monoclonal antibody. It is used to treat certain types of cancer like non-Hodgkin lymphoma and chronic lymphocytic leukemia. It is also used to treat rheumatoid arthritis, granulomatosis with polyangiitis (or Wegener's granulomatosis), microscopic polyangiitis, and pemphigus vulgaris. This medicine may be used for other purposes; ask your health care provider or pharmacist if you have questions. COMMON BRAND NAME(S): Rituxan, RUXIENCE What should I tell my health care provider before I take this medicine? They need to know if you have any of these conditions:  heart disease  infection  (especially a virus infection such as hepatitis B, chickenpox, cold sores, or herpes)  immune system problems  irregular heartbeat  kidney disease  low blood counts, like low white cell, platelet, or red cell counts  lung or breathing disease, like asthma  recently received or scheduled to receive a vaccine  an unusual or allergic reaction to rituximab, other medicines, foods, dyes, or preservatives  pregnant or trying to get pregnant  breast-feeding How should I use this medicine? This medicine is for infusion into a vein. It is administered in a hospital or clinic by a specially trained health care professional. A special MedGuide will be given to you by the pharmacist with each prescription and refill. Be sure to read this information carefully each time. Talk to your pediatrician regarding the use of this medicine in children. This medicine is not approved for use in children. Overdosage: If you think you have taken too much of this medicine contact a poison control center or emergency room at once. NOTE: This medicine is only for you. Do not share this medicine with others. What if I miss a dose? It is important not to miss a dose. Call your doctor or health care professional if you are unable to keep an appointment. What may interact with this medicine?  cisplatin  live virus vaccines This list may not describe all possible interactions. Give your health care provider a list of all the medicines, herbs, non-prescription drugs, or dietary supplements you use. Also tell them if you smoke, drink alcohol, or use illegal drugs. Some items may interact with your medicine. What should I watch for while using this medicine? Your condition will be monitored carefully while you are receiving this medicine. You may need blood work done while you are taking this medicine. This medicine can cause serious allergic reactions. To reduce your risk you may need to take medicine before  treatment with this medicine. Take your medicine as directed. In some patients, this medicine may cause a serious brain infection that may cause death. If you have any problems seeing, thinking, speaking, walking, or standing, tell your healthcare professional right away. If you cannot reach your healthcare professional, urgently seek other source of medical care. Call your doctor or health care professional for advice if you get a fever, chills or sore throat, or other symptoms of a cold or flu. Do not treat yourself. This drug decreases your body's ability to fight infections. Try to avoid being around people who are sick. Do not become pregnant while taking this medicine or for at least 12 months after stopping it. Women should inform their doctor if they wish to become pregnant or think they might be pregnant. There is a potential for serious side effects to an unborn child. Talk to your health care professional or pharmacist for more information. Do not breast-feed an infant while taking this medicine or for at least 6 months after stopping it. What side effects may I notice from receiving this medicine? Side effects that you should report to your doctor or health care professional as soon as possible:  allergic reactions like skin rash, itching or hives; swelling of the face, lips, or tongue  breathing problems  chest pain  changes in vision  diarrhea  headache with fever, neck stiffness, sensitivity to light, nausea, or confusion  fast, irregular heartbeat  loss of memory  low blood counts - this medicine may decrease the number of white blood cells, red blood cells and platelets. You may be at increased risk for infections and bleeding.  mouth sores  problems with  balance, talking, or walking  redness, blistering, peeling or loosening of the skin, including inside the mouth  signs of infection - fever or chills, cough, sore throat, pain or difficulty passing urine  signs and  symptoms of kidney injury like trouble passing urine or change in the amount of urine  signs and symptoms of liver injury like dark yellow or brown urine; general ill feeling or flu-like symptoms; light-colored stools; loss of appetite; nausea; right upper belly pain; unusually weak or tired; yellowing of the eyes or skin  signs and symptoms of low blood pressure like dizziness; feeling faint or lightheaded, falls; unusually weak or tired  stomach pain  swelling of the ankles, feet, hands  unusual bleeding or bruising  vomiting Side effects that usually do not require medical attention (report to your doctor or health care professional if they continue or are bothersome):  headache  joint pain  muscle cramps or muscle pain  nausea  tiredness This list may not describe all possible side effects. Call your doctor for medical advice about side effects. You may report side effects to FDA at 1-800-FDA-1088. Where should I keep my medicine? This drug is given in a hospital or clinic and will not be stored at home. NOTE: This sheet is a summary. It may not cover all possible information. If you have questions about this medicine, talk to your doctor, pharmacist, or health care provider.  2020 [employer] Standard (2018-07-05 22:01:36)

## 2020-04-30 ENCOUNTER — Other Ambulatory Visit: Payer: Self-pay | Admitting: Hematology

## 2020-04-30 LAB — SURGICAL PATHOLOGY

## 2020-05-05 ENCOUNTER — Telehealth: Payer: Self-pay

## 2020-05-05 ENCOUNTER — Other Ambulatory Visit: Payer: Self-pay

## 2020-05-05 ENCOUNTER — Inpatient Hospital Stay (HOSPITAL_BASED_OUTPATIENT_CLINIC_OR_DEPARTMENT_OTHER): Payer: 59 | Admitting: Hematology

## 2020-05-05 ENCOUNTER — Inpatient Hospital Stay: Payer: 59

## 2020-05-05 VITALS — BP 103/76 | HR 77 | Temp 98.0°F | Resp 18 | Ht 68.0 in | Wt 134.7 lb

## 2020-05-05 DIAGNOSIS — C8598 Non-Hodgkin lymphoma, unspecified, lymph nodes of multiple sites: Secondary | ICD-10-CM | POA: Diagnosis not present

## 2020-05-05 DIAGNOSIS — C8338 Diffuse large B-cell lymphoma, lymph nodes of multiple sites: Secondary | ICD-10-CM

## 2020-05-05 LAB — CMP (CANCER CENTER ONLY)
ALT: 110 U/L — ABNORMAL HIGH (ref 0–44)
AST: 61 U/L — ABNORMAL HIGH (ref 15–41)
Albumin: 3.4 g/dL — ABNORMAL LOW (ref 3.5–5.0)
Alkaline Phosphatase: 143 U/L — ABNORMAL HIGH (ref 38–126)
Anion gap: 11 (ref 5–15)
BUN: 7 mg/dL — ABNORMAL LOW (ref 8–23)
CO2: 25 mmol/L (ref 22–32)
Calcium: 9.5 mg/dL (ref 8.9–10.3)
Chloride: 102 mmol/L (ref 98–111)
Creatinine: 0.88 mg/dL (ref 0.61–1.24)
GFR, Estimated: >60 mL/min (ref >60)
Glucose, Bld: 95 mg/dL (ref 70–99)
Potassium: 3.8 mmol/L (ref 3.5–5.1)
Sodium: 138 mmol/L (ref 135–145)
Total Bilirubin: 0.2 mg/dL — ABNORMAL LOW (ref 0.3–1.2)
Total Protein: 6.6 g/dL (ref 6.5–8.1)

## 2020-05-05 LAB — CBC WITH DIFFERENTIAL/PLATELET
Abs Immature Granulocytes: 14.14 10*3/uL — ABNORMAL HIGH (ref 0.00–0.07)
Basophils Absolute: 0.1 10*3/uL (ref 0.0–0.1)
Basophils Relative: 0 %
Eosinophils Absolute: 0.5 10*3/uL (ref 0.0–0.5)
Eosinophils Relative: 1 %
HCT: 37.5 % — ABNORMAL LOW (ref 39.0–52.0)
Hemoglobin: 12.8 g/dL — ABNORMAL LOW (ref 13.0–17.0)
Immature Granulocytes: 28 %
Lymphocytes Relative: 3 %
Lymphs Abs: 1.6 10*3/uL (ref 0.7–4.0)
MCH: 30.3 pg (ref 26.0–34.0)
MCHC: 34.1 g/dL (ref 30.0–36.0)
MCV: 88.9 fL (ref 80.0–100.0)
Monocytes Absolute: 6 10*3/uL — ABNORMAL HIGH (ref 0.1–1.0)
Monocytes Relative: 12 %
Neutro Abs: 28.8 10*3/uL — ABNORMAL HIGH (ref 1.7–7.7)
Neutrophils Relative %: 56 %
Platelets: 208 10*3/uL (ref 150–400)
RBC: 4.22 MIL/uL (ref 4.22–5.81)
RDW: 12 % (ref 11.5–15.5)
WBC: 51.1 10*3/uL (ref 4.0–10.5)
nRBC: 0.2 % (ref 0.0–0.2)

## 2020-05-05 LAB — URIC ACID: Uric Acid, Serum: 6.7 mg/dL (ref 3.7–8.6)

## 2020-05-05 NOTE — Progress Notes (Signed)
HEMATOLOGY/ONCOLOGY CONSULTATION NOTE  Date of Service: 05/05/2020  Patient Care Team: System, Provider Not In as PCP - General  CHIEF COMPLAINTS/PURPOSE OF CONSULTATION:  Lung mass  HISTORY OF PRESENTING ILLNESS:  Bryan [name] is a wonderful 64 y.o. male who has been referred to [place] by Dr. Tammi Klippel for evaluation and management of lung mass. Pt is accompanied today by his wife. The pt reports that he is doing well overall.   The pt reports that he was diagnosed with low-grade Prostate Cancer in 2013. Pt had no symptoms, but got a check up after his father was diagnosed with Prostate Cancer. He was only treated with cryoablation. He also has a reducible, right inguinal hernia. Pt denies any other medical conditions or chronic medications. He does take a daily fish oil and multivitamin. He has NKDA.   About 6 months ago pt began having abdominal pain after eating. He saw several physicians who could not explain his symptoms. Last month he began having extreme discomfort in his chest and red stools. He is unsure if the color of his stools was from blood or diet. Pt then began experiencing left arm pain and pleuritic chest pain, which triggered his Chest CT in October.  In the last few nights pt has had roaming headaches that are improved with OTC Tylenol. Pt has lost 4-5 pounds recently, but attributes this to retiring and running more. Pt is currently taking two Oxycodone twice per day, which is controlling his discomfort. His wife also notes that the pt appears more fatigued than usual and is napping more. Pt felt poorly for 1 week after his second COVID19 vaccine, which he recived in March. Pt received the [place] vaccine, but waiting to receive the booster.   Of note prior to the patient's visit today, pt has had PET/CT ([id number]) completed on 04/08/2020 with results revealing "1. Intensely hypermetabolic bilateral neck, bilateral axillary, bilateral mediastinal, bilateral hilar and  retroperitoneal lymphadenopathy. Numerous small hypermetabolic splenic masses. Findings are compatible with malignancy, favoring lymphoproliferative disorder. The most accessible pathologic lymph nodes for potential percutaneous biopsy are likely within the left axilla and left supraclavicular neck."   Most recent lab results (03/31/2020) of CBC is as follows: all values are WNL except for HCT at 41.2, MPV at 9.7, Neutro Rel at 70.3, Immature Gran Rel at 0.4, Lymphs Rel at 15.8, Mono Rel at 10.3. 03/31/2020 D-Dimer at 0.51  On review of systems, pt reports headaches, chest pain, improved diarrhea, fatigue, mild productive cough, abdominal fullness and denies fevers, chills, night sweats, unexpected weight loss, low appetite, SOB, testicular pain/swelling, leg swelling, rash and any other symptoms.   On PMHx the pt reports Prostate Cancer, Cryotherapy, Inguinal hernia. 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 On Family Hx the pt reports a father with Prostate Cancer and a mother with Esophageal and Bladder Cancer (was a smoker).   INTERVAL HISTORY:  Bryan [name] is a wonderful 64 y.o. male who is here for evaluation and management of newly diagnosed Large B-cell lymphoma. We are joined today by his wife. The patient's last visit with [place] was on 04/15/2020. The pt reports that he is doing well overall.  The pt reports that he experienced significant lower back pain after receiving his G-CSF injection. He has remained as active as possible but notes that his levels of fatigue vary quite a bit. Pt is eating and hydrating well. He is eating lots of fresh fruits and vegetables  and taking Miralax daily for prevention. He still notes occasional constipation.   Lab results today (05/05/20) of CBC w/diff and CMP is as follows: all values are WNL except for WBC at 51.1K, Hgb at 12.8, HCT at 37.5, Neutro Abs at 28.8K, Mono Abs at 6.0K, Abs Immature Granulocytes at 14.14K, BUN at 7,  Albumin at 3.4, AST at 61, ALT at 110, ALP at 143, Total Bilirubin at 0.2. 05/05/2020 Uric acid at 6.7  On review of systems, pt reports occasional constipation, intermittent nausea and denies fevers, chills, night sweats and any other symptoms.   MEDICAL HISTORY:  Past Medical History:  Diagnosis Date  . Prostate cancer [facility])     SURGICAL HISTORY: Past Surgical History:  Procedure Laterality Date  . CRYOTHERAPY    . PROSTATE SURGERY      SOCIAL HISTORY: Social History   Socioeconomic History  . Marital status: Married    Spouse name: 1  . Number of children: Not on file  . Years of education: Not on file  . Highest education level: Not on file  Occupational History  . Occupation: Futures trader: [employer]    Comment: medical leave  Tobacco Use  . Smoking status: Never Smoker  . Smokeless tobacco: Never Used  Vaping Use  . Vaping Use: Never used  Substance and Sexual Activity  . Alcohol use: Not Currently  . Drug use: Never  . Sexual activity: Yes  Other Topics Concern  . Not on file  Social History Narrative  . Not on file   Social Determinants of Health   Financial Resource Strain:   . Difficulty of Paying Living Expenses: Not on file  Food Insecurity:   . Worried About Charity fundraiser in the Last Year: Not on file  . Ran Out of Food in the Last Year: Not on file  Transportation Needs:   . Lack of Transportation (Medical): Not on file  . Lack of Transportation (Non-Medical): Not on file  Physical Activity:   . Days of Exercise per Week: Not on file  . Minutes of Exercise per Session: Not on file  Stress:   . Feeling of Stress : Not on file  Social Connections:   . Frequency of Communication with Friends and Family: Not on file  . Frequency of Social Gatherings with Friends and Family: Not on file  . Attends Religious Services: Not on file  . Active Member of Clubs or Organizations: Not on file  . Attends Archivist Meetings: Not  on file  . Marital Status: Not on file  Intimate Partner Violence:   . Fear of Current or Ex-Partner: Not on file  . Emotionally Abused: Not on file  . Physically Abused: Not on file  . Sexually Abused: Not on file    FAMILY HISTORY: Family History  Problem Relation Age of Onset  . Bladder Cancer Mother   . Esophageal cancer Mother   . Prostate cancer Father   . Prostate cancer Paternal Uncle     ALLERGIES:  has No Known Allergies.  MEDICATIONS:  Current Outpatient Medications  Medication Sig Dispense Refill  . acetaminophen-codeine (TYLENOL #3) 300-30 MG tablet Take 1 tablet by mouth every 4 (four) hours as needed for moderate pain. 30 tablet 0  . allopurinol (ZYLOPRIM) 100 MG tablet Take 1 tablet (100 mg total) by mouth 2 (two) times daily. 60 tablet 1  . Cannabinoids (THC FREE PO) Take 1 tablet by mouth as needed (nausea/vomitting).    [name]  dexamethasone (DECADRON) 4 MG tablet Take 1 tablet twice a day for 3 days starting the day after each cycle of chemo 20 tablet 1  . dronabinol (MARINOL) 2.5 MG capsule Take 1 capsule (2.5 mg total) by mouth 2 (two) times daily before lunch and supper. 60 capsule 0  . LORazepam (ATIVAN) 0.5 MG tablet Take 1 tablet (0.5 mg total) by mouth every 6 (six) hours as needed (nausea/vomiting (if prochlorperazine not effective)). 30 tablet 0  . ondansetron (ZOFRAN) 8 MG tablet Take 1 tablet (8 mg total) by mouth every 8 (eight) hours as needed for nausea or vomiting. 20 tablet 0  . polyethylene glycol (MIRALAX / GLYCOLAX) 17 g packet Take 17 g by mouth daily as needed. 14 each 0  . prochlorperazine (COMPAZINE) 10 MG tablet Take 1 tablet (10 mg total) by mouth every 6 (six) hours as needed for nausea or vomiting. 30 tablet 0  . senna-docusate (SENOKOT-S) 8.6-50 MG tablet Take 1 tablet by mouth 2 (two) times daily as needed for mild constipation.     No current facility-administered medications for this visit.    REVIEW OF SYSTEMS:   A 10+ POINT REVIEW  OF SYSTEMS WAS OBTAINED including neurology, dermatology, psychiatry, cardiac, respiratory, lymph, extremities, GI, GU, Musculoskeletal, constitutional, breasts, reproductive, HEENT.  All pertinent positives are noted in the HPI.  All others are negative.   PHYSICAL EXAMINATION: ECOG PERFORMANCE STATUS: 1 - Symptomatic but completely ambulatory  . Vitals:   05/05/20 1156  BP: 103/76  Pulse: 77  Resp: 18  Temp: 98 F (36.7 C)  SpO2: 100%   Filed Weights   05/05/20 1156  Weight: 134 lb 11.2 oz (61.1 kg)   .Body mass index is 20.48 kg/m.   GENERAL:alert, in no acute distress and comfortable SKIN: no acute rashes, no significant lesions EYES: conjunctiva are pink and non-injected, sclera anicteric OROPHARYNX: MMM, no exudates, no oropharyngeal erythema or ulceration NECK: supple, no JVD LYMPH:  no palpable lymphadenopathy in the cervical, axillary or inguinal regions LUNGS: clear to auscultation b/l with normal respiratory effort HEART: regular rate & rhythm ABDOMEN:  normoactive bowel sounds , non tender, not distended. No palpable hepatosplenomegaly.  Extremity: no pedal edema PSYCH: alert & oriented x 3 with fluent speech NEURO: no focal motor/sensory deficits  LABORATORY DATA:  I have reviewed the data as listed  . CBC Latest Ref Rng & Units 05/05/2020 04/28/2020 04/25/2020  WBC 4.0 - 10.5 K/uL 51.1(HH) 8.8 8.4  Hemoglobin 13.0 - 17.0 g/dL 12.8(L) 14.4 12.4(L)  Hematocrit 39 - 52 % 37.5(L) 40.7 36.2(L)  Platelets 150 - 400 K/uL 208 300 259   . CMP Latest Ref Rng & Units 05/05/2020 04/28/2020 04/25/2020  Glucose 70 - 99 mg/dL 95 107(H) 86  BUN 8 - 23 mg/dL 7(L) 17 17  Creatinine 0.61 - 1.24 mg/dL 0.88 0.76 0.64  Sodium 135 - 145 mmol/L 138 134(L) 137  Potassium 3.5 - 5.1 mmol/L 3.8 3.8 3.5  Chloride 98 - 111 mmol/L 102 100 104  CO2 22 - 32 mmol/L _0 Calcium 8.9 - 10.3 mg/dL 9.5 10.1 8.9  Total Protein 6.5 - 8.1 g/dL 6.6 6.9 5.7(L)  Total Bilirubin 0.3 -  1.2 mg/dL 0.2(L) 0.6 0.7  Alkaline Phos 38 - 126 U/L 143(H) 75 65  AST 15 - 41 U/L 61(H) 21 21  ALT 0 - 44 U/L 110(H) 40 29   04/10/2020 FISH Analysis ([id number]):   04/10/2020 Left Cervical Lymph Node Bx ([id number]):  RADIOGRAPHIC STUDIES: I have personally reviewed the radiological images as listed and agreed with the findings in the report. MR Brain W Wo Contrast  Result Date: 04/18/2020 CLINICAL DATA:  Headache, new or worsening, Hematologic malignancy, staging Patient with newly diagnosed large B cell lymphoma with new headaches evaluation for CNS involvement EXAM: MRI HEAD WITHOUT AND WITH CONTRAST TECHNIQUE: Multiplanar, multiecho pulse sequences of the brain and surrounding structures were obtained without and with intravenous contrast. CONTRAST:  30m GADAVIST GADOBUTROL 1 MMOL/ML IV SOLN COMPARISON:  None. FINDINGS: Brain: No diffusion-weighted signal abnormality. No intracranial hemorrhage. No midline shift, ventriculomegaly or extra-axial fluid collection. No mass lesion. No abnormal enhancement. Cerebral volume is within normal limits. Minimal chronic microvascular ischemic changes. Vascular: Normal flow voids. Skull and upper cervical spine: Normal marrow signal. Sinuses/Orbits: Normal orbits. Small right maxillary and nasopharyngeal mucous retention cysts. Small right mastoid effusion. Other: None. IMPRESSION: No acute intracranial process. No evidence of metastatic disease. Electronically Signed   By: CPrimitivo GauzeM.D.   On: 04/18/2020 12:01   NM PET Image Initial (PI) Skull Base To Thigh  Result Date: 04/08/2020 CLINICAL DATA:  Initial treatment strategy for reported bulky mediastinal and bilateral hilar lymphadenopathy on recent outside chest CT angiogram study. History of prostate cancer in 2013. EXAM: NUCLEAR MEDICINE PET SKULL BASE TO THIGH TECHNIQUE: 6.7 mCi F-18 FDG was injected intravenously. Full-ring PET imaging was performed from the skull base to thigh after  the radiotracer. CT data was obtained and used for attenuation correction and anatomic localization. Fasting blood glucose: 85 mg/dl COMPARISON:  None. FINDINGS: Mediastinal blood pool activity: SUV max 1.9 Liver activity: SUV max 2.6 NECK: Mildly to moderately enlarged hypermetabolic bilateral neck lymph nodes involving level II on the right and levels III and IV on the left. Representative 0.9 cm right level II neck node with max SUV 15.5 (series 4/image 36). Representative 1.8 cm left level IV/supraclavicular neck node with max SUV 22.6 (series 4/image 40). Incidental CT findings: none CHEST: Mildly enlarged hypermetabolic bilateral axillary lymph nodes, largest 1.6 cm on the left with max SUV 21.5 (series 4/image 55). Bulky hypermetabolic bilateral mediastinal and bilateral hilar lymph nodes. Representative 3.3 cm short axis diameter subcarinal node with max SUV 22.1 (series 4/image 76). Representative 2.1 cm AP window node with max SUV 18.8 (series 4/image 68). Representative high right paratracheal 1.2 cm node near the thoracic inlet with max SUV 20.7 (series 4/image 56). Representative 1.1 cm anterior low paraesophageal node with max SUV 10.8 (series 4/image 100). Right hilar adenopathy with max SUV 24.9 and left hilar adenopathy with max SUV 20.3. Several necrotic lymph nodes are scattered in the mediastinum and hila, for example measuring 3.4 cm in the high right mediastinum near the thoracic inlet (series 4/image 51). No hypermetabolic pulmonary findings. Incidental CT findings: Left anterior descending and right coronary atherosclerosis. Minimally atherosclerotic nonaneurysmal thoracic aorta. No significant pulmonary nodules. ABDOMEN/PELVIS: Numerous hypermetabolic slightly hypodense masses throughout the spleen, largest 2.1 cm in the inferior spleen with max SUV 12.5 (series 4/image 120). Spleen is overall normal size. Several hypermetabolic enlarged lymph nodes throughout the retroperitoneum involving  left para celiac and aortocaval chains. Representative largely necrotic high left para celiac 3.2 cm node with max SUV 10.0 (series 4/image 113). Splenic hilar 1.2 cm node with max SUV 17.6. Aortocaval 0.7 cm node with max SUV 12.7 (series 4/image 120). No enlarged or hypermetabolic pelvic lymph nodes. No abnormal hypermetabolic activity within the liver, pancreas or adrenal glands. Incidental CT findings: Top-normal  size prostate with scattered nonspecific internal prostatic calcifications. SKELETON: No focal hypermetabolic activity to suggest skeletal metastasis. Incidental CT findings: none IMPRESSION: 1. Intensely hypermetabolic bilateral neck, bilateral axillary, bilateral mediastinal, bilateral hilar and retroperitoneal lymphadenopathy. Numerous small hypermetabolic splenic masses. Findings are compatible with malignancy, favoring lymphoproliferative disorder. The most accessible pathologic lymph nodes for potential percutaneous biopsy are likely within the left axilla and left supraclavicular neck. 2. Chronic findings include: Aortic Atherosclerosis (ICD10-I70.0). Coronary atherosclerosis. Electronically Signed   By: Ilona Sorrel M.D.   On: 04/08/2020 13:58   ECHOCARDIOGRAM COMPLETE  Result Date: 04/21/2020    ECHOCARDIOGRAM REPORT   Patient Name:   JAHREE DERMODY Date of Exam: 04/21/2020 Medical Rec #:  [id number]     Height:       68.0 in Accession #:    [id number]    Weight:       137.1 lb Date of Birth:  [birth year]    BSA:          1.741 m Patient Age:    49 years      BP:           107/70 mmHg Patient Gender: M             HR:           58 bpm. Exam Location:  Inpatient Procedure: 2D Echo STAT ECHO Indications:   chemotherapy evaluation  History:       Patient has no prior history of Echocardiogram examinations.                Cancer.  Sonographer:   Jannett Celestine RDCS (AE) Referring      [place]ally Phys:  Sonographer Comments: Image acquisition challenging due to patient body habitus.  IMPRESSIONS  1. Left ventricular ejection fraction, by estimation, is 65 to 70%. The left ventricle has normal function. The left ventricle has no regional wall motion abnormalities. Left ventricular diastolic function could not be evaluated.  2. Right ventricular systolic function is normal. The right ventricular size is normal.  3. The mitral valve is normal in structure. Trivial mitral valve regurgitation. No evidence of mitral stenosis.  4. The aortic valve is normal in structure. Aortic valve regurgitation is not visualized. No aortic stenosis is present.  5. The inferior vena cava is normal in size with greater than 50% respiratory variability, suggesting right atrial pressure of 3 mmHg. FINDINGS  Left Ventricle: Left ventricular ejection fraction, by estimation, is 65 to 70%. The left ventricle has normal function. The left ventricle has no regional wall motion abnormalities. The left ventricular internal cavity size was normal in size. There is  no left ventricular hypertrophy. Left ventricular diastolic function could not be evaluated. Right Ventricle: The right ventricular size is normal. No increase in right ventricular wall thickness. Right ventricular systolic function is normal. Left Atrium: Left atrial size was normal in size. Right Atrium: Right atrial size was normal in size. Pericardium: There is no evidence of pericardial effusion. Mitral Valve: The mitral valve is normal in structure. Trivial mitral valve regurgitation. No evidence of mitral valve stenosis. Tricuspid Valve: The tricuspid valve is normal in structure. Tricuspid valve regurgitation is trivial. No evidence of tricuspid stenosis. Aortic Valve: The aortic valve is normal in structure. Aortic valve regurgitation is not visualized. No aortic stenosis is present. Pulmonic Valve: The pulmonic valve was normal in structure. Pulmonic valve regurgitation is trivial. No evidence of pulmonic stenosis. Aorta: The aortic root is normal in size  and structure. Venous: The inferior vena cava is normal in size with greater than 50% respiratory variability, suggesting right atrial pressure of 3 mmHg. IAS/Shunts: There is right bowing of the interatrial septum, suggestive of elevated left atrial pressure. No atrial level shunt detected by color flow Doppler. Candee Furbish MD Electronically signed by Candee Furbish MD Signature Date/Time: 04/21/2020/11:53:58 AM    Final    [place] CORE BIOPSY (LYMPH NODES)  Result Date: 04/10/2020 INDICATION: Bulky adenopathy, concern for lymphoma EXAM: ULTRASOUND GUIDED CORE BIOPSY OF LEFT SUPRACLAVICULAR ADENOPATHY MEDICATIONS: 1% LIDOCAINE LOCAL ANESTHESIA/SEDATION: Versed 1.8m IV; Fentanyl 549m IV; Moderate Sedation Time:  10 MINUTES The patient was continuously monitored during the procedure by the interventional radiology nurse under my direct supervision. FLUOROSCOPY TIME:  Fluoroscopy Time: None. COMPLICATIONS: None immediate. PROCEDURE: The procedure, risks, benefits, and alternatives were explained to the patient. Questions regarding the procedure were encouraged and answered. The patient understands and consents to the procedure. The left supraclavicular area was prepped with ChloraPrep in a sterile fashion, and a sterile drape was applied covering the operative field. A sterile gown and sterile gloves were used for the procedure. Local anesthesia was provided with 1% Lidocaine. Previous imaging reviewed. Preliminary ultrasound performed. The left supraclavicular bulky adenopathy was localized and marked. Under sterile conditions and local anesthesia, an 18 gauge core biopsy needle was advanced to the supraclavicular adenopathy. 6 18 gauge core biopsies obtained. These were intact and non fragmented. Samples placed in saline. Postprocedure imaging demonstrates no hemorrhage or hematoma. Patient tolerated biopsy well. FINDINGS: Imaging confirms needle placed into the left supraclavicular adenopathy for core biopsy  IMPRESSION: Successful ultrasound left supraclavicular adenopathy 18 gauge core biopsies Electronically Signed   By: M.Jerilynn Mages Shick M.D.   On: 04/10/2020 09:01   IR PICC PLACEMENT RIGHT >5 YRS INC IMG GUIDE  Result Date: 04/21/2020 INDICATION: History of lymphoma. In need of durable intravenous access for the initiation chemotherapy. EXAM: ULTRASOUND AND FLUOROSCOPIC GUIDED PICC LINE INSERTION MEDICATIONS: None. CONTRAST:  Cc Omnipaque 300 FLUOROSCOPY TIME:  18 seconds COMPLICATIONS: None immediate. TECHNIQUE: The procedure, risks, benefits, and alternatives were explained to the patient and informed written consent was obtained. A timeout was performed prior to the initiation of the procedure. The right upper extremity was prepped with chlorhexidine in a sterile fashion, and a sterile drape was applied covering the operative field. Maximum barrier sterile technique with sterile gowns and gloves were used for the procedure. A timeout was performed prior to the initiation of the procedure. Local anesthesia was provided with 1% lidocaine. Under direct ultrasound guidance, the brachial vein was accessed with a micropuncture kit after the overlying soft tissues were anesthetized with 1% lidocaine. After the overlying soft tissues were anesthetized, a small venotomy incision was created and a micropuncture kit was utilized to access the right brachial vein. Real-time ultrasound guidance was utilized for vascular access including the acquisition of a permanent ultrasound image documenting patency of the accessed vessel. A guidewire was advanced to the level of the level of the axilla and a peel-away sheath was placed. As there was difficulty advancing the microwire centrally, a limited venogram was performed via the micropuncture sheath demonstrating patency of the right upper extremity central venous system. Ultimately, a microwire was advanced to the level of the superior cavoatrial junction and utilized for measurement  purposes. Next, a 38 cm, 5 FrPakistandual lumen was inserted to level of the superior caval-atrial junction. A post procedure spot fluoroscopic was obtained. The catheter easily aspirated and  flushed and was secured in place with stat lock device. A dressing was applied. The patient tolerated the procedure well without immediate post procedural complication. FINDINGS: After catheter placement, the tip lies within the superior cavoatrial junction. The catheter aspirates and flushes normally and is ready for immediate use. IMPRESSION: Successful ultrasound and fluoroscopic guided placement of a right brachial vein approach, cm, 5 French, dual lumen PICC with tip at the superior caval-atrial junction. The PICC line is ready for immediate use. Electronically Signed   By: Sandi Mariscal M.D.   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 Intensely hypermetabolic bilateral neck, bilateral axillary, bilateral mediastinal, bilateral hilar and retroperitoneal lymphadenopathy. Numerous small hypermetabolic splenic masses. Findings are compatible with malignancy, favoring lymphoproliferative disorder." 04/10/2020 Left Cervical Lymph Node Bx ([id number]) revealed "Diffuse large B-cell lymphoma. KI-67 is 80-90%"  PLAN: -Discussed pt labwork today, 05/05/20; WBC have responded, no anemia, PLT are nml, liver enzymes are elevated, Uric acid is WNL. -The pt has no prohibitive toxicities from continuing C2 R-CHOP in 1 week.  -Recommended that the pt continue to eat well, drink at least 48-64 oz of water each day, and walk 20-30 minutes each day.  -Advised pt that the risk of CNS recurrence with Large B-cell lymphomas is 5-15%. Will consider CNS prophylaxis treatment eventhough pt does not have a double-hit lymphoma.  -Recommend pt use Senna for constipation prevention. -Recommend pt f/u for  Port-a-Cath placement as scheduled. -Plan to repeat PET/CT after C3.   -Will see back in 3 weeks with labs    FOLLOW UP: -Inpatient [facility] chemotherapy admission from 12/6 for 5 days -Outpatient Rituxan on 05/19/2020 -RTC with Dr Irene Limbo with portflush and labs on 05/26/2020   The total time spent in the appt was 30 minutes and more than 50% was on counseling and direct patient cares, ordering and management of chemotherapy  All of the patient's questions were answered with apparent satisfaction. The patient knows to call the clinic with any problems, questions or concerns.    Sullivan Lone MD [place] AAHIVMS [facility] Hematology/Oncology Physician [place] [facility]  (Office):       [phone] (Work cell):  [phone] (Fax):           [phone]  05/05/2020 1:26 PM  I, Yevette Edwards, am acting as a scribe for Dr. Sullivan Lone.   .I have reviewed the above documentation for accuracy and completeness, and I agree with the above. Brunetta Genera MD

## 2020-05-05 NOTE — Telephone Encounter (Signed)
CRITICAL VALUE STICKER  CRITICAL VALUE: WBC = 51.1  RECEIVER (on-site recipient of call): Yetta Glassman, CMA  DATE & TIME NOTIFIED: 05/05/20 at 11:48am  MESSENGER (representative from lab): Hillary  MD NOTIFIED: Dr. Irene Limbo  TIME OF NOTIFICATION: 05/05/20 at 11:54am  RESPONSE: Notification given to Carlyon Prows, RN for follow-up with provider.

## 2020-05-06 ENCOUNTER — Other Ambulatory Visit: Payer: Self-pay | Admitting: Oncology

## 2020-05-06 ENCOUNTER — Other Ambulatory Visit: Payer: Self-pay | Admitting: Radiology

## 2020-05-07 ENCOUNTER — Other Ambulatory Visit: Payer: Self-pay | Admitting: Radiology

## 2020-05-08 ENCOUNTER — Ambulatory Visit (HOSPITAL_COMMUNITY)
Admission: RE | Admit: 2020-05-08 | Discharge: 2020-05-08 | Disposition: A | Discharge status: Home / Self Care | Payer: 59 | Source: Ambulatory Visit | Attending: Hematology | Admitting: Hematology

## 2020-05-08 ENCOUNTER — Other Ambulatory Visit: Payer: Self-pay | Admitting: Hematology

## 2020-05-08 ENCOUNTER — Other Ambulatory Visit: Payer: Self-pay

## 2020-05-08 ENCOUNTER — Telehealth: Payer: Self-pay | Admitting: *Deleted

## 2020-05-08 ENCOUNTER — Encounter (HOSPITAL_COMMUNITY): Payer: Self-pay

## 2020-05-08 DIAGNOSIS — Z79899 Other long term (current) drug therapy: Secondary | ICD-10-CM | POA: Insufficient documentation

## 2020-05-08 DIAGNOSIS — C8330 Diffuse large B-cell lymphoma, unspecified site: Secondary | ICD-10-CM | POA: Insufficient documentation

## 2020-05-08 DIAGNOSIS — Z8546 Personal history of malignant neoplasm of prostate: Secondary | ICD-10-CM | POA: Diagnosis not present

## 2020-05-08 DIAGNOSIS — C8338 Diffuse large B-cell lymphoma, lymph nodes of multiple sites: Secondary | ICD-10-CM

## 2020-05-08 HISTORY — PX: IR IMAGING GUIDED PORT INSERTION: IMG5740

## 2020-05-08 LAB — CBC WITH DIFFERENTIAL/PLATELET
Abs Immature Granulocytes: 4.4 10*3/uL — ABNORMAL HIGH (ref 0.00–0.07)
Band Neutrophils: 6 %
Basophils Absolute: 0.5 10*3/uL — ABNORMAL HIGH (ref 0.0–0.1)
Basophils Relative: 1 %
Eosinophils Absolute: 0 10*3/uL (ref 0.0–0.5)
Eosinophils Relative: 0 %
HCT: 35.8 % — ABNORMAL LOW (ref 39.0–52.0)
Hemoglobin: 12.1 g/dL — ABNORMAL LOW (ref 13.0–17.0)
Lymphocytes Relative: 5 %
Lymphs Abs: 2.5 10*3/uL (ref 0.7–4.0)
MCH: 30.5 pg (ref 26.0–34.0)
MCHC: 33.8 g/dL (ref 30.0–36.0)
MCV: 90.2 fL (ref 80.0–100.0)
Metamyelocytes Relative: 5 %
Monocytes Absolute: 1.5 10*3/uL — ABNORMAL HIGH (ref 0.1–1.0)
Monocytes Relative: 3 %
Myelocytes: 4 %
Neutro Abs: 40.2 10*3/uL — ABNORMAL HIGH (ref 1.7–7.7)
Neutrophils Relative %: 76 %
Platelets: 191 10*3/uL (ref 150–400)
RBC: 3.97 MIL/uL — ABNORMAL LOW (ref 4.22–5.81)
RDW: 12.3 % (ref 11.5–15.5)
WBC: 49 10*3/uL — ABNORMAL HIGH (ref 4.0–10.5)
nRBC: 0 % (ref 0.0–0.2)

## 2020-05-08 LAB — PROTIME-INR
INR: 1 (ref 0.8–1.2)
Prothrombin Time: 12.6 seconds (ref 11.4–15.2)

## 2020-05-08 MED ORDER — SODIUM CHLORIDE 0.9 % IV SOLN: INTRAVENOUS | Status: DC

## 2020-05-08 MED ORDER — MIDAZOLAM HCL 2 MG/2ML IJ SOLN
INTRAMUSCULAR | Status: AC | PRN
  Administered 2020-05-08 (×4): 1 mg via INTRAVENOUS

## 2020-05-08 MED ORDER — CEFAZOLIN SODIUM-DEXTROSE 2-4 GM/100ML-% IV SOLN: 2.0000 g | INTRAVENOUS | Status: AC

## 2020-05-08 MED ORDER — ACETAMINOPHEN-CODEINE #3 300-30 MG PO TABS: 1.0000 | ORAL_TABLET | ORAL | 0 refills | Status: DC | PRN

## 2020-05-08 MED ORDER — MIDAZOLAM HCL 2 MG/2ML IJ SOLN
INTRAMUSCULAR | Status: AC
  Filled 2020-05-08: qty 4

## 2020-05-08 MED ORDER — FENTANYL CITRATE (PF) 100 MCG/2ML IJ SOLN
INTRAMUSCULAR | Status: AC
  Filled 2020-05-08: qty 4

## 2020-05-08 MED ORDER — FENTANYL CITRATE (PF) 100 MCG/2ML IJ SOLN
INTRAMUSCULAR | Status: AC | PRN
Start: 2020-05-08 — End: 2020-05-08
  Administered 2020-05-08 (×2): 50 ug via INTRAVENOUS

## 2020-05-08 MED ORDER — LIDOCAINE-EPINEPHRINE 1 %-1:100000 IJ SOLN
INTRAMUSCULAR | Status: AC
  Filled 2020-05-08: qty 1

## 2020-05-08 MED ORDER — CEFAZOLIN SODIUM-DEXTROSE 2-4 GM/100ML-% IV SOLN
INTRAVENOUS | Status: AC
  Administered 2020-05-08: 2 g via INTRAVENOUS
  Filled 2020-05-08: qty 100

## 2020-05-08 MED ORDER — LIDOCAINE-EPINEPHRINE 1 %-1:100000 IJ SOLN
INTRAMUSCULAR | Status: AC | PRN
  Administered 2020-05-08 (×2): 10 mL via INTRADERMAL

## 2020-05-08 MED ORDER — HEPARIN SOD (PORK) LOCK FLUSH 100 UNIT/ML IV SOLN
INTRAVENOUS | Status: AC | PRN
  Administered 2020-05-08: 500 [IU] via INTRAVENOUS

## 2020-05-08 MED ORDER — HEPARIN SOD (PORK) LOCK FLUSH 100 UNIT/ML IV SOLN
INTRAVENOUS | Status: AC
  Filled 2020-05-08: qty 5

## 2020-05-08 NOTE — Discharge Instructions (Signed)
Urgent needs - IR on call MD [phone]  Wound - May remove dressing and shower in 24 to 48 hours.  Keep site clean and dry.  Replace with bandaid. Do not submerge in tub or water until site healing well.  If ordered by your provider, may start Emla cream in 2 weeks or after incision is healed.  After completion of treatment, your provider should have you set up for monthly port flushes.    Implanted Port Insertion, Care After This sheet gives you information about how to care for yourself after your procedure. Your health care provider may also give you more specific instructions. If you have problems or questions, contact your health care provider. What can I expect after the procedure? After the procedure, it is common to have:  Discomfort at the port insertion site.  Bruising on the skin over the port. This should improve over 3-4 days. Follow these instructions at home: Port care  After your port is placed, you will get a manufacturer's information card. The card has information about your port. Keep this card with you at all times.  Take care of the port as told by your health care provider. Ask your health care provider if you or a family member can get training for taking care of the port at home. A home health care nurse may also take care of the port.  Make sure to remember what type of port you have. Incision care  Follow instructions from your health care provider about how to take care of your port insertion site. Make sure you: ? Wash your hands with soap and water before and after you change your bandage (dressing). If soap and water are not available, use hand sanitizer. ? Change your dressing as told by your health care provider. ? Leave stitches (sutures), skin glue, or adhesive strips in place. These skin closures may need to stay in place for 2 weeks or longer. If adhesive strip edges start to loosen and curl up, you may trim the loose edges. Do not remove adhesive  strips completely unless your health care provider tells you to do that.  Check your port insertion site every day for signs of infection. Check for: ? Redness, swelling, or pain. ? Fluid or blood. ? Warmth. ? Pus or a bad smell. Activity  Return to your normal activities as told by your health care provider. Ask your health care provider what activities are safe for you.  Do not lift anything that is heavier than 10 lb (4.5 kg), or the limit that you are told, until your health care provider says that it is safe. General instructions  Take over-the-counter and prescription medicines only as told by your health care provider.  Do not take baths, swim, or use a hot tub until your health care provider approves. Ask your health care provider if you may take showers. You may only be allowed to take sponge baths.  Do not drive for 24 hours if you were given a sedative during your procedure.  Wear a medical alert bracelet in case of an emergency. This will tell any health care providers that you have a port.  Keep all follow-up visits as told by your health care provider. This is important. Contact a health care provider if:  You cannot flush your port with saline as directed, or you cannot draw blood from the port.  You have a fever or chills.  You have redness, swelling, or pain around your port   insertion site.  You have fluid or blood coming from your port insertion site.  Your port insertion site feels warm to the touch.  You have pus or a bad smell coming from the port insertion site. Get help right away if:  You have chest pain or shortness of breath.  You have bleeding from your port that you cannot control. Summary  Take care of the port as told by your health care provider. Keep the manufacturer's information card with you at all times.  Change your dressing as told by your health care provider.  Contact a health care provider if you have a fever or chills or if you  have redness, swelling, or pain around your port insertion site.  Keep all follow-up visits as told by your health care provider. This information is not intended to replace advice given to you by your health care provider. Make sure you discuss any questions you have with your health care provider. Document Revised: 12/20/2017 Document Reviewed: 12/20/2017 Elsevier Patient Education  2020 [employer]   Moderate Conscious Sedation, Adult, Care After These instructions provide you with information about caring for yourself after your procedure. Your health care provider may also give you more specific instructions. Your treatment has been planned according to current medical practices, but problems sometimes occur. Call your health care provider if you have any problems or questions after your procedure. What can I expect after the procedure? After your procedure, it is common:  To feel sleepy for several hours.  To feel clumsy and have poor balance for several hours.  To have poor judgment for several hours.  To vomit if you eat too soon. Follow these instructions at home: For at least 24 hours after the procedure:  Do not: ? Participate in activities where you could fall or become injured. ? Drive. ? Use heavy machinery. ? Drink alcohol. ? Take sleeping pills or medicines that cause drowsiness. ? Make important decisions or sign legal documents. ? Take care of children on your own.  Rest. Eating and drinking  Follow the diet recommended by your health care provider.  If you vomit: ? Drink water, juice, or soup when you can drink without vomiting. ? Make sure you have little or no nausea before eating solid foods. General instructions  Have a responsible adult stay with you until you are awake and alert.  Take over-the-counter and prescription medicines only as told by your health care provider.  If you smoke, do not smoke without supervision.  Keep all follow-up  visits as told by your health care provider. This is important. Contact a health care provider if:  You keep feeling nauseous or you keep vomiting.  You feel light-headed.  You develop a rash.  You have a fever. Get help right away if:  You have trouble breathing. This information is not intended to replace advice given to you by your health care provider. Make sure you discuss any questions you have with your health care provider. Document Revised: 05/06/2017 Document Reviewed: 09/13/2015 Elsevier Patient Education  2020 Elsevier Inc.   

## 2020-05-08 NOTE — H&P (Signed)
Referring Physician(s): Brunetta Genera  Supervising Physician: Jacqulynn Cadet  Patient Status:  [facility] OP  Chief Complaint:  "I'm getting a port a cath"  Subjective: Patient familiar to IR service from left supraclavicular lymph node biopsy on 04/10/2020 and PICC placement on 04/21/2020.  He has a prior history of prostate cancer and now with newly diagnosed diffuse large B-cell lymphoma . He presents again today for Port-A-Cath placement for additional chemotherapy.  He currently denies fever, chest pain, dyspnea, vomiting or bleeding.  He does have occasional headaches, occasional cough, intermittent abdominal and back discomfort and occasional nausea.  Past Medical History:  Diagnosis Date  . Prostate cancer [facility])    Past Surgical History:  Procedure Laterality Date  . CRYOTHERAPY    . PROSTATE SURGERY        Allergies: Patient has no known allergies.  Medications: Prior to Admission medications   Medication Sig Start Date End Date Taking? Authorizing Provider  acetaminophen-codeine (TYLENOL #3) 300-30 MG tablet Take 1 tablet by mouth every 4 (four) hours as needed for moderate pain. 04/25/20   Maryanna Shape, NP  allopurinol (ZYLOPRIM) 100 MG tablet Take 1 tablet (100 mg total) by mouth 2 (two) times daily. 04/24/20   Maryanna Shape, NP  Cannabinoids (THC FREE PO) Take 1 tablet by mouth as needed (nausea/vomitting).    [provider]  dexamethasone (DECADRON) 4 MG tablet Take 1 tablet twice a day for 3 days starting the day after each cycle of chemo 04/24/20   Maryanna Shape, NP  dronabinol (MARINOL) 2.5 MG capsule Take 1 capsule (2.5 mg total) by mouth 2 (two) times daily before lunch and supper. 04/24/20   Curcio, Roselie Awkward, NP  LORazepam (ATIVAN) 0.5 MG tablet Take 1 tablet (0.5 mg total) by mouth every 6 (six) hours as needed (nausea/vomiting (if prochlorperazine not effective)). 04/24/20   Curcio, Roselie Awkward, NP  ondansetron (ZOFRAN) 8 MG  tablet Take 1 tablet (8 mg total) by mouth every 8 (eight) hours as needed for nausea or vomiting. 04/24/20   Maryanna Shape, NP  polyethylene glycol (MIRALAX / GLYCOLAX) 17 g packet Take 17 g by mouth daily as needed. 04/24/20   Maryanna Shape, NP  prochlorperazine (COMPAZINE) 10 MG tablet Take 1 tablet (10 mg total) by mouth every 6 (six) hours as needed for nausea or vomiting. 04/24/20   Curcio, Roselie Awkward, NP  senna-docusate (SENOKOT-S) 8.6-50 MG tablet Take 1 tablet by mouth 2 (two) times daily as needed for mild constipation. 04/24/20   Maryanna Shape, NP     Vital Signs:pend   Physical Exam awake, alert.  Chest clear to auscultation bilaterally.  Heart with regular rate and rhythm.  Abdomen soft, positive bowel sounds, nontender.  No lower extremity edema.  Imaging: No results found.  Labs:  CBC: Recent Labs    04/24/20 0500 04/25/20 0633 04/28/20 0836 05/05/20 1133  WBC 11.3* 8.4 8.8 51.1*  HGB 12.5* 12.4* 14.4 12.8*  HCT 36.6* 36.2* 40.7 37.5*  PLT 256 259 300 208    COAGS: Recent Labs    04/10/20 0557  INR 1.0    BMP: Recent Labs    04/24/20 0500 04/25/20 0633 04/28/20 0836 05/05/20 1133  NA 136 137 134* 138  K 3.6 3.5 3.8 3.8  CL 102 104 100 102  CO2 24 24 22 25   GLUCOSE 90 86 107* 95  BUN 17 17 17  7*  CALCIUM 8.9 8.9 10.1 9.5  CREATININE 0.68  0.64 0.76 0.88  GFRNONAA >60 >60 >60 >60    LIVER FUNCTION TESTS: Recent Labs    04/24/20 0500 04/25/20 0633 04/28/20 0836 05/05/20 1133  BILITOT 0.9 0.7 0.6 0.2*  AST 25 21 21  61*  ALT 31 29 40 110*  ALKPHOS 59 65 75 143*  PROT 6.1* 5.7* 6.9 6.6  ALBUMIN 3.3* 3.3* 4.1 3.4*    Assessment and Plan: Pt with prior history of prostate cancer and now with newly diagnosed diffuse large B-cell lymphoma . He presents  today for Port-A-Cath placement for additional chemotherapy.  Risks and benefits of image guided port-a-catheter placement was discussed with the patient including, but not limited  to bleeding, infection, pneumothorax, or fibrin sheath development and need for additional procedures.  All of the patient's questions were answered, patient is agreeable to proceed. Consent signed and in chart.     Electronically Signed: D. Rowe Robert, PA-C 05/08/2020, 12:37 PM   I spent a total of 25 minutes at the the patient's bedside AND on the patient's hospital floor or unit, greater than 50% of which was counseling/coordinating care for Port-A-Cath placement

## 2020-05-08 NOTE — Procedures (Signed)
Interventional Radiology Procedure Note  Procedure: Placement of a right IJ approach single lumen PowerPort.  Tip is positioned at the superior cavoatrial junction and catheter is ready for immediate use.  Complications: No immediate Recommendations:  - Ok to shower tomorrow - Do not submerge for 7 days - Routine line care   Signed,  Deriyah Kunath K. Earle Troiano, MD   

## 2020-05-08 NOTE — Telephone Encounter (Signed)
Scheduled for Monday 12/6 AM admission per [facility] in patient placement. Covid screen is scheduled for Saturday. Will have port placed 12/2. Email sent to #[email or web address] Patient/wife informed of all times and locations and verbalized understanding

## 2020-05-09 NOTE — Telephone Encounter (Signed)
Dr. Irene Limbo, You have already refilled this, it appears so please refuse if appropriate. Gardiner Rhyme, RN

## 2020-05-10 ENCOUNTER — Other Ambulatory Visit (HOSPITAL_COMMUNITY)
Admission: RE | Admit: 2020-05-10 | Discharge: 2020-05-10 | Disposition: A | Discharge status: Home / Self Care | Payer: 59 | Source: Ambulatory Visit | Attending: Hematology | Admitting: Hematology

## 2020-05-10 ENCOUNTER — Other Ambulatory Visit (HOSPITAL_COMMUNITY): Payer: 59

## 2020-05-10 DIAGNOSIS — Z01812 Encounter for preprocedural laboratory examination: Secondary | ICD-10-CM | POA: Insufficient documentation

## 2020-05-10 DIAGNOSIS — Z20822 Contact with and (suspected) exposure to covid-19: Secondary | ICD-10-CM | POA: Insufficient documentation

## 2020-05-10 LAB — SARS CORONAVIRUS 2 (TAT 6-24 HRS): SARS Coronavirus 2: NEGATIVE

## 2020-05-12 ENCOUNTER — Inpatient Hospital Stay (HOSPITAL_COMMUNITY)
Admission: RE | Admit: 2020-05-12 | Discharge: 2020-05-16 | DRG: 842 | Disposition: A | Discharge status: Home / Self Care | Payer: 59 | Attending: Hematology | Admitting: Hematology

## 2020-05-12 ENCOUNTER — Encounter (HOSPITAL_COMMUNITY): Payer: Self-pay | Admitting: Hematology

## 2020-05-12 DIAGNOSIS — Z800 Family history of malignant neoplasm of digestive organs: Secondary | ICD-10-CM

## 2020-05-12 DIAGNOSIS — Z79899 Other long term (current) drug therapy: Secondary | ICD-10-CM | POA: Diagnosis not present

## 2020-05-12 DIAGNOSIS — Z8546 Personal history of malignant neoplasm of prostate: Secondary | ICD-10-CM

## 2020-05-12 DIAGNOSIS — D649 Anemia, unspecified: Secondary | ICD-10-CM | POA: Diagnosis present

## 2020-05-12 DIAGNOSIS — C8338 Diffuse large B-cell lymphoma, lymph nodes of multiple sites: Secondary | ICD-10-CM | POA: Diagnosis present

## 2020-05-12 DIAGNOSIS — Z8042 Family history of malignant neoplasm of prostate: Secondary | ICD-10-CM

## 2020-05-12 DIAGNOSIS — R0781 Pleurodynia: Secondary | ICD-10-CM | POA: Diagnosis present

## 2020-05-12 DIAGNOSIS — Z20822 Contact with and (suspected) exposure to covid-19: Secondary | ICD-10-CM | POA: Diagnosis present

## 2020-05-12 DIAGNOSIS — T380X5A Adverse effect of glucocorticoids and synthetic analogues, initial encounter: Secondary | ICD-10-CM | POA: Diagnosis present

## 2020-05-12 DIAGNOSIS — Z8052 Family history of malignant neoplasm of bladder: Secondary | ICD-10-CM | POA: Diagnosis not present

## 2020-05-12 DIAGNOSIS — Z7189 Other specified counseling: Secondary | ICD-10-CM

## 2020-05-12 DIAGNOSIS — Z5111 Encounter for antineoplastic chemotherapy: Secondary | ICD-10-CM | POA: Diagnosis not present

## 2020-05-12 DIAGNOSIS — D72829 Elevated white blood cell count, unspecified: Secondary | ICD-10-CM | POA: Diagnosis present

## 2020-05-12 DIAGNOSIS — R918 Other nonspecific abnormal finding of lung field: Secondary | ICD-10-CM | POA: Diagnosis present

## 2020-05-12 DIAGNOSIS — C8598 Non-Hodgkin lymphoma, unspecified, lymph nodes of multiple sites: Secondary | ICD-10-CM | POA: Diagnosis present

## 2020-05-12 LAB — CBC WITH DIFFERENTIAL/PLATELET
Abs Immature Granulocytes: 3.8 10*3/uL — ABNORMAL HIGH (ref 0.00–0.07)
Band Neutrophils: 6 %
Basophils Absolute: 0.6 10*3/uL — ABNORMAL HIGH (ref 0.0–0.1)
Basophils Relative: 2 %
Eosinophils Absolute: 0.3 10*3/uL (ref 0.0–0.5)
Eosinophils Relative: 1 %
HCT: 34.1 % — ABNORMAL LOW (ref 39.0–52.0)
Hemoglobin: 11.7 g/dL — ABNORMAL LOW (ref 13.0–17.0)
Lymphocytes Relative: 4 %
Lymphs Abs: 1.2 10*3/uL (ref 0.7–4.0)
MCH: 30.2 pg (ref 26.0–34.0)
MCHC: 34.3 g/dL (ref 30.0–36.0)
MCV: 87.9 fL (ref 80.0–100.0)
Metamyelocytes Relative: 2 %
Monocytes Absolute: 0.9 10*3/uL (ref 0.1–1.0)
Monocytes Relative: 3 %
Myelocytes: 9 %
Neutro Abs: 22.4 10*3/uL — ABNORMAL HIGH (ref 1.7–7.7)
Neutrophils Relative %: 71 %
Platelets: 331 10*3/uL (ref 150–400)
Promyelocytes Relative: 2 %
RBC: 3.88 MIL/uL — ABNORMAL LOW (ref 4.22–5.81)
RDW: 12.4 % (ref 11.5–15.5)
WBC: 29.1 10*3/uL — ABNORMAL HIGH (ref 4.0–10.5)
nRBC: 0 % (ref 0.0–0.2)

## 2020-05-12 LAB — URIC ACID: Uric Acid, Serum: 5.6 mg/dL (ref 3.7–8.6)

## 2020-05-12 LAB — COMPREHENSIVE METABOLIC PANEL
ALT: 45 U/L — ABNORMAL HIGH (ref 0–44)
AST: 23 U/L (ref 15–41)
Albumin: 3.3 g/dL — ABNORMAL LOW (ref 3.5–5.0)
Alkaline Phosphatase: 112 U/L (ref 38–126)
Anion gap: 10 (ref 5–15)
BUN: 12 mg/dL (ref 8–23)
CO2: 24 mmol/L (ref 22–32)
Calcium: 9.1 mg/dL (ref 8.9–10.3)
Chloride: 104 mmol/L (ref 98–111)
Creatinine, Ser: 0.61 mg/dL (ref 0.61–1.24)
GFR, Estimated: >60 mL/min (ref >60)
Glucose, Bld: 101 mg/dL — ABNORMAL HIGH (ref 70–99)
Potassium: 4 mmol/L (ref 3.5–5.1)
Sodium: 138 mmol/L (ref 135–145)
Total Bilirubin: 0.4 mg/dL (ref 0.3–1.2)
Total Protein: 6.3 g/dL — ABNORMAL LOW (ref 6.5–8.1)

## 2020-05-12 LAB — LACTATE DEHYDROGENASE: LDH: 203 U/L — ABNORMAL HIGH (ref 98–192)

## 2020-05-12 MED ORDER — SENNOSIDES-DOCUSATE SODIUM 8.6-50 MG PO TABS
1.0000 | ORAL_TABLET | Freq: Two times a day (BID) | ORAL | Status: DC
  Administered 2020-05-12 – 2020-05-16 (×8): 1 via ORAL
  Filled 2020-05-12 (×8): qty 1

## 2020-05-12 MED ORDER — PREDNISONE 20 MG PO TABS: 60.0000 mg | ORAL_TABLET | Freq: Every day | ORAL | Status: DC

## 2020-05-12 MED ORDER — PROCHLORPERAZINE MALEATE 10 MG PO TABS
10.0000 mg | ORAL_TABLET | Freq: Four times a day (QID) | ORAL | Status: DC | PRN
  Administered 2020-05-16: 10 mg via ORAL
  Filled 2020-05-12 (×2): qty 1

## 2020-05-12 MED ORDER — SODIUM CHLORIDE 0.9% FLUSH: 10.0000 mL | INTRAVENOUS | Status: DC | PRN

## 2020-05-12 MED ORDER — SODIUM CHLORIDE 0.9 % IV SOLN: INTRAVENOUS | Status: DC

## 2020-05-12 MED ORDER — COLD PACK MISC ONCOLOGY
1.0000 | Freq: Once | Status: DC | PRN
  Filled 2020-05-12: qty 1

## 2020-05-12 MED ORDER — SODIUM CHLORIDE 0.9% FLUSH
10.0000 mL | Freq: Two times a day (BID) | INTRAVENOUS | Status: DC
  Administered 2020-05-12 – 2020-05-13 (×4): 10 mL
  Administered 2020-05-14: 20 mL
  Administered 2020-05-15 – 2020-05-16 (×3): 10 mL

## 2020-05-12 MED ORDER — LORAZEPAM 0.5 MG PO TABS
0.5000 mg | ORAL_TABLET | Freq: Four times a day (QID) | ORAL | Status: DC | PRN
  Administered 2020-05-14 – 2020-05-15 (×2): 0.5 mg via ORAL
  Filled 2020-05-12 (×2): qty 1

## 2020-05-12 MED ORDER — ACETAMINOPHEN-CODEINE #3 300-30 MG PO TABS
1.0000 | ORAL_TABLET | ORAL | Status: DC | PRN
  Administered 2020-05-12 – 2020-05-16 (×13): 1 via ORAL
  Filled 2020-05-12 (×13): qty 1

## 2020-05-12 MED ORDER — HOT PACK MISC ONCOLOGY
1.0000 | Freq: Once | Status: DC | PRN
  Filled 2020-05-12: qty 1

## 2020-05-12 MED ORDER — ALLOPURINOL 100 MG PO TABS
100.0000 mg | ORAL_TABLET | Freq: Two times a day (BID) | ORAL | Status: DC
  Administered 2020-05-12 – 2020-05-16 (×8): 100 mg via ORAL
  Filled 2020-05-12 (×8): qty 1

## 2020-05-12 MED ORDER — POLYETHYLENE GLYCOL 3350 17 G PO PACK
17.0000 g | PACK | Freq: Every day | ORAL | Status: DC
  Administered 2020-05-13 – 2020-05-16 (×4): 17 g via ORAL
  Filled 2020-05-12 (×4): qty 1

## 2020-05-12 MED ORDER — CHLORHEXIDINE GLUCONATE CLOTH 2 % EX PADS
6.0000 | MEDICATED_PAD | Freq: Every day | CUTANEOUS | Status: DC
  Administered 2020-05-12 – 2020-05-15 (×4): 6 via TOPICAL

## 2020-05-12 MED ORDER — ENOXAPARIN SODIUM 40 MG/0.4ML ~~LOC~~ SOLN
40.0000 mg | SUBCUTANEOUS | Status: DC
  Administered 2020-05-12 – 2020-05-15 (×4): 40 mg via SUBCUTANEOUS
  Filled 2020-05-12 (×4): qty 0.4

## 2020-05-12 MED ORDER — ONDANSETRON HCL 8 MG PO TABS
8.0000 mg | ORAL_TABLET | Freq: Three times a day (TID) | ORAL | Status: DC | PRN
  Administered 2020-05-16: 8 mg via ORAL
  Filled 2020-05-12: qty 1

## 2020-05-12 MED ORDER — SODIUM BICARBONATE/SODIUM CHLORIDE MOUTHWASH
Freq: Four times a day (QID) | OROMUCOSAL | Status: DC
  Administered 2020-05-15 (×3): 1 via OROMUCOSAL
  Filled 2020-05-12: qty 1000

## 2020-05-12 MED ORDER — DRONABINOL 2.5 MG PO CAPS
2.5000 mg | ORAL_CAPSULE | Freq: Two times a day (BID) | ORAL | Status: DC
  Administered 2020-05-12 – 2020-05-15 (×7): 2.5 mg via ORAL
  Filled 2020-05-12 (×7): qty 1

## 2020-05-12 MED ORDER — PREDNISONE 20 MG PO TABS
60.0000 mg | ORAL_TABLET | Freq: Every day | ORAL | Status: AC
  Administered 2020-05-12 – 2020-05-16 (×5): 60 mg via ORAL
  Filled 2020-05-12 (×5): qty 3

## 2020-05-12 MED ORDER — SODIUM CHLORIDE 0.9 % IV SOLN
Freq: Once | INTRAVENOUS | Status: AC
  Administered 2020-05-12: 18 mg via INTRAVENOUS
  Filled 2020-05-12: qty 4

## 2020-05-12 MED ORDER — VINCRISTINE SULFATE CHEMO INJECTION 1 MG/ML
Freq: Once | INTRAVENOUS | Status: AC
  Filled 2020-05-12: qty 10

## 2020-05-12 NOTE — H&P (Addendum)
[place]  [phone] Fax:([phone] [facility]. [place] H&P  Reason for Admission: Cycle #2 EPOCH-R  HPI: Bryan [name] is a wonderful 64 y.o. male who has been referred to [place] by Dr. Tammi Klippel for evaluation and management of lung mass.  About 6 months ago pt began having abdominal pain after eating. He saw several physicians who could not explain his symptoms.  He then developed extreme discomfort in his chest and red stools. He is unsure if the color of his stools was from blood or diet. Pt then began experiencing left arm pain and pleuritic chest pain, which triggered his Chest CTA in October.  CTA chest ruled out PE but showed bulky mediastinal and bilateral hilar adenopathy.  He was referred to hematology oncology at [facility] and a PET scan and biopsy were ordered.  There were significant delays in getting these tests completed.  Bryan [name] then establish care here in [place], [place] with hopes of expediting his work-up.  He underwent a PET scan on 04/08/2020 which showed intensely hypermetabolic bilateral neck, bilateral axillary, bilateral mediastinal, bilateral hilar, and retroperitoneal lymphadenopathy, numerous small hypermetabolic splenic masses.  He then had an ultrasound-guided biopsy of a left supraclavicular lymph node which showed diffuse large B-cell lymphoma.  MRI of the brain with and without contrast was performed on 04/18/2020 which showed no acute intracranial process and no evidence for metastatic disease.  It was recommended for the patient to begin systemic chemotherapy with EPOCH-R.   The patient is seen today for admission prior to cycle #2 of his chemotherapy.  His wife is at the bedside. Had fatigue following first cycle of chemotherapy.  He reports ongoing arthralgias.  He uses Tylenol #3 for his pain. He had a headache this morning which has now resolved. He is not having any fevers.  He denies chest pain and shortness of  breath.  Denies abdominal pain, nausea, vomiting.  Bowels are moving well at this time. The patient is seen today for admission for cycle #2 of his chemotherapy.    Past Medical History:  Diagnosis Date  . Prostate cancer [facility])   :   Past Surgical History:  Procedure Laterality Date  . CRYOTHERAPY    . IR IMAGING GUIDED PORT INSERTION  05/08/2020  . PROSTATE SURGERY    :   No current facility-administered medications for this encounter.   Current Outpatient Medications  Medication Sig Dispense Refill  . acetaminophen-codeine (TYLENOL #3) 300-30 MG tablet Take 1 tablet by mouth every 4 (four) hours as needed for moderate pain. 30 tablet 0  . allopurinol (ZYLOPRIM) 100 MG tablet Take 1 tablet (100 mg total) by mouth 2 (two) times daily. 60 tablet 1  . Cannabinoids (THC FREE PO) Take 1 tablet by mouth as needed (nausea/vomitting).    [name] dexamethasone (DECADRON) 4 MG tablet Take 1 tablet twice a day for 3 days starting the day after each cycle of chemo 20 tablet 1  . dronabinol (MARINOL) 2.5 MG capsule Take 1 capsule (2.5 mg total) by mouth 2 (two) times daily before lunch and supper. 60 capsule 0  . LORazepam (ATIVAN) 0.5 MG tablet Take 1 tablet (0.5 mg total) by mouth every 6 (six) hours as needed (nausea/vomiting (if prochlorperazine not effective)). 30 tablet 0  . ondansetron (ZOFRAN) 8 MG tablet Take 1 tablet (8 mg total) by mouth every 8 (eight) hours as needed for nausea or vomiting. 20 tablet 0  . polyethylene glycol (MIRALAX /  GLYCOLAX) 17 g packet Take 17 g by mouth daily as needed. 14 each 0  . prochlorperazine (COMPAZINE) 10 MG tablet Take 1 tablet (10 mg total) by mouth every 6 (six) hours as needed for nausea or vomiting. 30 tablet 0  . senna-docusate (SENOKOT-S) 8.6-50 MG tablet Take 1 tablet by mouth 2 (two) times daily as needed for mild constipation.       No Known Allergies:   Family History  Problem Relation Age of Onset  . Bladder Cancer Mother   . Esophageal  cancer Mother   . Prostate cancer Father   . Prostate cancer Paternal Uncle   :   Social History   Socioeconomic History  . Marital status: Married    Spouse name: 1  . Number of children: Not on file  . Years of education: Not on file  . Highest education level: Not on file  Occupational History  . Occupation: Futures trader: [employer]    Comment: medical leave  Tobacco Use  . Smoking status: Never Smoker  . Smokeless tobacco: Never Used  Vaping Use  . Vaping Use: Never used  Substance and Sexual Activity  . Alcohol use: Not Currently  . Drug use: Never  . Sexual activity: Yes  Other Topics Concern  . Not on file  Social History Narrative  . Not on file   Social Determinants of Health   Financial Resource Strain:   . Difficulty of Paying Living Expenses: Not on file  Food Insecurity:   . Worried About Charity fundraiser in the Last Year: Not on file  . Ran Out of Food in the Last Year: Not on file  Transportation Needs:   . Lack of Transportation (Medical): Not on file  . Lack of Transportation (Non-Medical): Not on file  Physical Activity:   . Days of Exercise per Week: Not on file  . Minutes of Exercise per Session: Not on file  Stress:   . Feeling of Stress : Not on file  Social Connections:   . Frequency of Communication with Friends and Family: Not on file  . Frequency of Social Gatherings with Friends and Family: Not on file  . Attends Religious Services: Not on file  . Active Member of Clubs or Organizations: Not on file  . Attends Archivist Meetings: Not on file  . Marital Status: Not on file  Intimate Partner Violence:   . Fear of Current or Ex-Partner: Not on file  . Emotionally Abused: Not on file  . Physically Abused: Not on file  . Sexually Abused: Not on file  :  Review of Systems: A comprehensive 14 point review of systems was negative except as noted in the HPI.  Exam: No data found.  General:  well-nourished in no  acute distress.   Eyes:  no scleral icterus.   ENT:  There were no oropharyngeal lesions.   Neck was without thyromegaly.   Lymphatics:  Negative cervical, supraclavicular or axillary adenopathy.   Respiratory: lungs were clear bilaterally without wheezing or crackles.   Cardiovascular:  Regular rate and rhythm, S1/S2, without murmur, rub or gallop.  There was no pedal edema.   GI:  abdomen was soft, flat, nontender, nondistended, without organomegaly.   Musculoskeletal:  no spinal tenderness of palpation of vertebral spine.   Skin exam was without echymosis, petichae.   Neuro exam was nonfocal. Patient was alert and oriented.  Attention was good.   Language was appropriate.  Mood was normal without depression.  Speech was not pressured.  Thought content was not tangential.     Lab Results  Component Value Date   WBC 49.0 (H) 05/08/2020   HGB 12.1 (L) 05/08/2020   HCT 35.8 (L) 05/08/2020   PLT 191 05/08/2020   GLUCOSE 95 05/05/2020   ALT 110 (H) 05/05/2020   AST 61 (H) 05/05/2020   NA 138 05/05/2020   K 3.8 05/05/2020   CL 102 05/05/2020   CREATININE 0.88 05/05/2020   BUN 7 (L) 05/05/2020   CO2 25 05/05/2020    MR Brain W Wo Contrast  Result Date: 04/18/2020 CLINICAL DATA:  Headache, new or worsening, Hematologic malignancy, staging Patient with newly diagnosed large B cell lymphoma with new headaches evaluation for CNS involvement EXAM: MRI HEAD WITHOUT AND WITH CONTRAST TECHNIQUE: Multiplanar, multiecho pulse sequences of the brain and surrounding structures were obtained without and with intravenous contrast. CONTRAST:  85m GADAVIST GADOBUTROL 1 MMOL/ML IV SOLN COMPARISON:  None. FINDINGS: Brain: No diffusion-weighted signal abnormality. No intracranial hemorrhage. No midline shift, ventriculomegaly or extra-axial fluid collection. No mass lesion. No abnormal enhancement. Cerebral volume is within normal limits. Minimal chronic microvascular ischemic changes. Vascular: Normal flow  voids. Skull and upper cervical spine: Normal marrow signal. Sinuses/Orbits: Normal orbits. Small right maxillary and nasopharyngeal mucous retention cysts. Small right mastoid effusion. Other: None. IMPRESSION: No acute intracranial process. No evidence of metastatic disease. Electronically Signed   By: CPrimitivo GauzeM.D.   On: 04/18/2020 12:01   ECHOCARDIOGRAM COMPLETE  Result Date: 04/21/2020    ECHOCARDIOGRAM REPORT   Patient Name:   Bryan DOMMERDate of Exam: 04/21/2020 Medical Rec #:  [id number]    Height:       68.0 in Accession #:    [id number]   Weight:       137.1 lb Date of Birth:  [birth year]   BSA:          1.741 m Patient Age:    89years      BP:           107/70 mmHg Patient Gender: M             HR:           58 bpm. Exam Location:  Inpatient Procedure: 2D Echo STAT ECHO Indications:   chemotherapy evaluation  History:       Patient has no prior history of Echocardiogram examinations.                Cancer.  Sonographer:   VJannett CelestineRDCS (AE) Referring      3Plain ViewPhys:  Sonographer Comments: Image acquisition challenging due to patient body habitus. IMPRESSIONS  1. Left ventricular ejection fraction, by estimation, is 65 to 70%. The left ventricle has normal function. The left ventricle has no regional wall motion abnormalities. Left ventricular diastolic function could not be evaluated.  2. Right ventricular systolic function is normal. The right ventricular size is normal.  3. The mitral valve is normal in structure. Trivial mitral valve regurgitation. No evidence of mitral stenosis.  4. The aortic valve is normal in structure. Aortic valve regurgitation is not visualized. No aortic stenosis is present.  5. The inferior vena cava is normal in size with greater than 50% respiratory variability, suggesting right atrial pressure of 3 mmHg. FINDINGS  Left Ventricle: Left ventricular ejection fraction, by estimation, is 65 to 70%. The left ventricle has normal  function. The left ventricle has no regional wall motion abnormalities. The left ventricular internal cavity size was normal in size. There is  no left ventricular hypertrophy. Left ventricular diastolic function could not be evaluated. Right Ventricle: The right ventricular size is normal. No increase in right ventricular wall thickness. Right ventricular systolic function is normal. Left Atrium: Left atrial size was normal in size. Right Atrium: Right atrial size was normal in size. Pericardium: There is no evidence of pericardial effusion. Mitral Valve: The mitral valve is normal in structure. Trivial mitral valve regurgitation. No evidence of mitral valve stenosis. Tricuspid Valve: The tricuspid valve is normal in structure. Tricuspid valve regurgitation is trivial. No evidence of tricuspid stenosis. Aortic Valve: The aortic valve is normal in structure. Aortic valve regurgitation is not visualized. No aortic stenosis is present. Pulmonic Valve: The pulmonic valve was normal in structure. Pulmonic valve regurgitation is trivial. No evidence of pulmonic stenosis. Aorta: The aortic root is normal in size and structure. Venous: The inferior vena cava is normal in size with greater than 50% respiratory variability, suggesting right atrial pressure of 3 mmHg. IAS/Shunts: There is right bowing of the interatrial septum, suggestive of elevated left atrial pressure. No atrial level shunt detected by color flow Doppler. Candee Furbish MD Electronically signed by Candee Furbish MD Signature Date/Time: 04/21/2020/11:53:58 AM    Final    IR IMAGING GUIDED PORT INSERTION  Result Date: 05/08/2020 INDICATION: 64 year old male with diffuse large B-cell lymphoma. He requires port catheter placement for durable IV access. EXAM: IMPLANTED PORT A CATH PLACEMENT WITH ULTRASOUND AND FLUOROSCOPIC GUIDANCE MEDICATIONS: 2 g Ancef; The antibiotic was administered within an appropriate time interval prior to skin puncture.  ANESTHESIA/SEDATION: Versed 4 mg IV; Fentanyl 100 mcg IV; Moderate Sedation Time:  26 minutes The patient was continuously monitored during the procedure by the interventional radiology nurse under my direct supervision. FLUOROSCOPY TIME:  1 minutes, 12 seconds (9 mGy) COMPLICATIONS: None immediate. PROCEDURE: The right neck and chest was prepped with chlorhexidine, and draped in the usual sterile fashion using maximum barrier technique (cap and mask, sterile gown, sterile gloves, large sterile sheet, hand hygiene and cutaneous antiseptic). Local anesthesia was attained by infiltration with 1% lidocaine with epinephrine. Ultrasound demonstrated patency of the right internal jugular vein, and this was documented with an image. Under real-time ultrasound guidance, this vein was accessed with a 21 gauge micropuncture needle and image documentation was performed. A small dermatotomy was made at the access site with an 11 scalpel. A 0.018" wire was advanced into the SVC and the access needle exchanged for a 52F micropuncture vascular sheath. The 0.018" wire was then removed and a 0.035" wire advanced into the IVC. An appropriate location for the subcutaneous reservoir was selected below the clavicle and an incision was made through the skin and underlying soft tissues. The subcutaneous tissues were then dissected using a combination of blunt and sharp surgical technique and a pocket was formed. A single lumen power injectable portacatheter was then tunneled through the subcutaneous tissues from the pocket to the dermatotomy and the port reservoir placed within the subcutaneous pocket. The venous access site was then serially dilated and a peel away vascular sheath placed over the wire. The wire was removed and the port catheter advanced into position under fluoroscopic guidance. The catheter tip is positioned in the superior cavoatrial junction. This was documented with a spot image. The portacatheter was then tested and  found to flush and aspirate well. The port was flushed  with saline followed by 100 units/mL heparinized saline. The pocket was then closed in two layers using first subdermal inverted interrupted absorbable sutures followed by a running subcuticular suture. The epidermis was then sealed with Dermabond. The dermatotomy at the venous access site was also closed with Dermabond. IMPRESSION: Successful placement of a right IJ approach Power Port with ultrasound and fluoroscopic guidance. The catheter is ready for use. Electronically Signed   By: Jacqulynn Cadet M.D.   On: 05/08/2020 15:34   IR PICC PLACEMENT RIGHT >5 YRS INC IMG GUIDE  Result Date: 04/21/2020 INDICATION: History of lymphoma. In need of durable intravenous access for the initiation chemotherapy. EXAM: ULTRASOUND AND FLUOROSCOPIC GUIDED PICC LINE INSERTION MEDICATIONS: None. CONTRAST:  Cc Omnipaque 300 FLUOROSCOPY TIME:  18 seconds COMPLICATIONS: None immediate. TECHNIQUE: The procedure, risks, benefits, and alternatives were explained to the patient and informed written consent was obtained. A timeout was performed prior to the initiation of the procedure. The right upper extremity was prepped with chlorhexidine in a sterile fashion, and a sterile drape was applied covering the operative field. Maximum barrier sterile technique with sterile gowns and gloves were used for the procedure. A timeout was performed prior to the initiation of the procedure. Local anesthesia was provided with 1% lidocaine. Under direct ultrasound guidance, the brachial vein was accessed with a micropuncture kit after the overlying soft tissues were anesthetized with 1% lidocaine. After the overlying soft tissues were anesthetized, a small venotomy incision was created and a micropuncture kit was utilized to access the right brachial vein. Real-time ultrasound guidance was utilized for vascular access including the acquisition of a permanent ultrasound image documenting  patency of the accessed vessel. A guidewire was advanced to the level of the level of the axilla and a peel-away sheath was placed. As there was difficulty advancing the microwire centrally, a limited venogram was performed via the micropuncture sheath demonstrating patency of the right upper extremity central venous system. Ultimately, a microwire was advanced to the level of the superior cavoatrial junction and utilized for measurement purposes. Next, a 38 cm, 5 [place], dual lumen was inserted to level of the superior caval-atrial junction. A post procedure spot fluoroscopic was obtained. The catheter easily aspirated and flushed and was secured in place with stat lock device. A dressing was applied. The patient tolerated the procedure well without immediate post procedural complication. FINDINGS: After catheter placement, the tip lies within the superior cavoatrial junction. The catheter aspirates and flushes normally and is ready for immediate use. IMPRESSION: Successful ultrasound and fluoroscopic guided placement of a right brachial vein approach, cm, 5 French, dual lumen PICC with tip at the superior caval-atrial junction. The PICC line is ready for immediate use. Electronically Signed   By: Sandi Mariscal M.D.   On: 04/21/2020 09:34     MR Brain W Wo Contrast  Result Date: 04/18/2020 CLINICAL DATA:  Headache, new or worsening, Hematologic malignancy, staging Patient with newly diagnosed large B cell lymphoma with new headaches evaluation for CNS involvement EXAM: MRI HEAD WITHOUT AND WITH CONTRAST TECHNIQUE: Multiplanar, multiecho pulse sequences of the brain and surrounding structures were obtained without and with intravenous contrast. CONTRAST:  65m GADAVIST GADOBUTROL 1 MMOL/ML IV SOLN COMPARISON:  None. FINDINGS: Brain: No diffusion-weighted signal abnormality. No intracranial hemorrhage. No midline shift, ventriculomegaly or extra-axial fluid collection. No mass lesion. No abnormal enhancement.  Cerebral volume is within normal limits. Minimal chronic microvascular ischemic changes. Vascular: Normal flow voids. Skull and upper cervical spine: Normal marrow  signal. Sinuses/Orbits: Normal orbits. Small right maxillary and nasopharyngeal mucous retention cysts. Small right mastoid effusion. Other: None. IMPRESSION: No acute intracranial process. No evidence of metastatic disease. Electronically Signed   By: Primitivo Gauze M.D.   On: 04/18/2020 12:01   ECHOCARDIOGRAM COMPLETE  Result Date: 04/21/2020    ECHOCARDIOGRAM REPORT   Patient Name:   Bryan [name] Date of Exam: 04/21/2020 Medical Rec #:  [id number]     Height:       68.0 in Accession #:    [id number]    Weight:       137.1 lb Date of Birth:  [birth year]    BSA:          1.741 m Patient Age:    31 years      BP:           107/70 mmHg Patient Gender: M             HR:           58 bpm. Exam Location:  Inpatient Procedure: 2D Echo STAT ECHO Indications:   chemotherapy evaluation  History:       Patient has no prior history of Echocardiogram examinations.                Cancer.  Sonographer:   Jannett Celestine RDCS (AE) Referring      [place] Phys:  Sonographer Comments: Image acquisition challenging due to patient body habitus. IMPRESSIONS  1. Left ventricular ejection fraction, by estimation, is 65 to 70%. The left ventricle has normal function. The left ventricle has no regional wall motion abnormalities. Left ventricular diastolic function could not be evaluated.  2. Right ventricular systolic function is normal. The right ventricular size is normal.  3. The mitral valve is normal in structure. Trivial mitral valve regurgitation. No evidence of mitral stenosis.  4. The aortic valve is normal in structure. Aortic valve regurgitation is not visualized. No aortic stenosis is present.  5. The inferior vena cava is normal in size with greater than 50% respiratory variability, suggesting right atrial pressure of 3 mmHg. FINDINGS  Left  Ventricle: Left ventricular ejection fraction, by estimation, is 65 to 70%. The left ventricle has normal function. The left ventricle has no regional wall motion abnormalities. The left ventricular internal cavity size was normal in size. There is  no left ventricular hypertrophy. Left ventricular diastolic function could not be evaluated. Right Ventricle: The right ventricular size is normal. No increase in right ventricular wall thickness. Right ventricular systolic function is normal. Left Atrium: Left atrial size was normal in size. Right Atrium: Right atrial size was normal in size. Pericardium: There is no evidence of pericardial effusion. Mitral Valve: The mitral valve is normal in structure. Trivial mitral valve regurgitation. No evidence of mitral valve stenosis. Tricuspid Valve: The tricuspid valve is normal in structure. Tricuspid valve regurgitation is trivial. No evidence of tricuspid stenosis. Aortic Valve: The aortic valve is normal in structure. Aortic valve regurgitation is not visualized. No aortic stenosis is present. Pulmonic Valve: The pulmonic valve was normal in structure. Pulmonic valve regurgitation is trivial. No evidence of pulmonic stenosis. Aorta: The aortic root is normal in size and structure. Venous: The inferior vena cava is normal in size with greater than 50% respiratory variability, suggesting right atrial pressure of 3 mmHg. IAS/Shunts: There is right bowing of the interatrial septum, suggestive of elevated left atrial pressure. No atrial level shunt detected by color flow Doppler. [employer]  MD Electronically signed by Candee Furbish MD Signature Date/Time: 04/21/2020/11:53:58 AM    Final    IR IMAGING GUIDED PORT INSERTION  Result Date: 05/08/2020 INDICATION: 64 year old male with diffuse large B-cell lymphoma. He requires port catheter placement for durable IV access. EXAM: IMPLANTED PORT A CATH PLACEMENT WITH ULTRASOUND AND FLUOROSCOPIC GUIDANCE MEDICATIONS: 2 g Ancef; The  antibiotic was administered within an appropriate time interval prior to skin puncture. ANESTHESIA/SEDATION: Versed 4 mg IV; Fentanyl 100 mcg IV; Moderate Sedation Time:  26 minutes The patient was continuously monitored during the procedure by the interventional radiology nurse under my direct supervision. FLUOROSCOPY TIME:  1 minutes, 12 seconds (9 mGy) COMPLICATIONS: None immediate. PROCEDURE: The right neck and chest was prepped with chlorhexidine, and draped in the usual sterile fashion using maximum barrier technique (cap and mask, sterile gown, sterile gloves, large sterile sheet, hand hygiene and cutaneous antiseptic). Local anesthesia was attained by infiltration with 1% lidocaine with epinephrine. Ultrasound demonstrated patency of the right internal jugular vein, and this was documented with an image. Under real-time ultrasound guidance, this vein was accessed with a 21 gauge micropuncture needle and image documentation was performed. A small dermatotomy was made at the access site with an 11 scalpel. A 0.018" wire was advanced into the SVC and the access needle exchanged for a 52F micropuncture vascular sheath. The 0.018" wire was then removed and a 0.035" wire advanced into the IVC. An appropriate location for the subcutaneous reservoir was selected below the clavicle and an incision was made through the skin and underlying soft tissues. The subcutaneous tissues were then dissected using a combination of blunt and sharp surgical technique and a pocket was formed. A single lumen power injectable portacatheter was then tunneled through the subcutaneous tissues from the pocket to the dermatotomy and the port reservoir placed within the subcutaneous pocket. The venous access site was then serially dilated and a peel away vascular sheath placed over the wire. The wire was removed and the port catheter advanced into position under fluoroscopic guidance. The catheter tip is positioned in the superior cavoatrial  junction. This was documented with a spot image. The portacatheter was then tested and found to flush and aspirate well. The port was flushed with saline followed by 100 units/mL heparinized saline. The pocket was then closed in two layers using first subdermal inverted interrupted absorbable sutures followed by a running subcuticular suture. The epidermis was then sealed with Dermabond. The dermatotomy at the venous access site was also closed with Dermabond. IMPRESSION: Successful placement of a right IJ approach Power Port with ultrasound and fluoroscopic guidance. The catheter is ready for use. Electronically Signed   By: Jacqulynn Cadet M.D.   On: 05/08/2020 15:34   IR PICC PLACEMENT RIGHT >5 YRS INC IMG GUIDE  Result Date: 04/21/2020 INDICATION: History of lymphoma. In need of durable intravenous access for the initiation chemotherapy. EXAM: ULTRASOUND AND FLUOROSCOPIC GUIDED PICC LINE INSERTION MEDICATIONS: None. CONTRAST:  Cc Omnipaque 300 FLUOROSCOPY TIME:  18 seconds COMPLICATIONS: None immediate. TECHNIQUE: The procedure, risks, benefits, and alternatives were explained to the patient and informed written consent was obtained. A timeout was performed prior to the initiation of the procedure. The right upper extremity was prepped with chlorhexidine in a sterile fashion, and a sterile drape was applied covering the operative field. Maximum barrier sterile technique with sterile gowns and gloves were used for the procedure. A timeout was performed prior to the initiation of the procedure. Local anesthesia was provided with 1%  lidocaine. Under direct ultrasound guidance, the brachial vein was accessed with a micropuncture kit after the overlying soft tissues were anesthetized with 1% lidocaine. After the overlying soft tissues were anesthetized, a small venotomy incision was created and a micropuncture kit was utilized to access the right brachial vein. Real-time ultrasound guidance was utilized for  vascular access including the acquisition of a permanent ultrasound image documenting patency of the accessed vessel. A guidewire was advanced to the level of the level of the axilla and a peel-away sheath was placed. As there was difficulty advancing the microwire centrally, a limited venogram was performed via the micropuncture sheath demonstrating patency of the right upper extremity central venous system. Ultimately, a microwire was advanced to the level of the superior cavoatrial junction and utilized for measurement purposes. Next, a 38 cm, 5 [place], dual lumen was inserted to level of the superior caval-atrial junction. A post procedure spot fluoroscopic was obtained. The catheter easily aspirated and flushed and was secured in place with stat lock device. A dressing was applied. The patient tolerated the procedure well without immediate post procedural complication. FINDINGS: After catheter placement, the tip lies within the superior cavoatrial junction. The catheter aspirates and flushes normally and is ready for immediate use. IMPRESSION: Successful ultrasound and fluoroscopic guided placement of a right brachial vein approach, cm, 5 French, dual lumen PICC with tip at the superior caval-atrial junction. The PICC line is ready for immediate use. Electronically Signed   By: Sandi Mariscal M.D.   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 Intensely hypermetabolic bilateral neck, bilateral axillary, bilateral mediastinal, bilateral hilar and retroperitoneal lymphadenopathy. Numerous small hypermetabolic splenic masses. Findings are compatible with malignancy, favoring lymphoproliferative disorder."  -Admit to inpatient oncology unit for cycle 2 of EPOCH-R.   -Obtain baseline labs including a CBC with differential, CMET, LDH, uric acid.  Will check daily labs. -Continue  allopurinol 100 mg twice a day. -As needed antiemetics and bowel regimen have been ordered. -Continue home pain medication. -Lovenox for DVT prophylaxis. -The patient will have rituximab and G-CSF as an outpatient on 05/19/2020 and will have a lab and follow-up visit for toxicity check on 05/26/2020.  Mikey Bussing, DNP, AGPCNP-BC, AOCNP    ADDENDUM  .Patient was Personally and independently interviewed, examined and relevant elements of the history of present illness were reviewed in details and an assessment and plan was created. All elements of the patient's history of present illness , assessment and plan were discussed in details with Mikey Bussing, DNP, AGPCNP-BC, AOCNP. The above documentation reflects our combined findings assessment and plan.  Blood counts have been stable through cycle 1 of treatment and no other prohibitive toxicities noted. Discussed with patient -- will increase Doxorubicin and Etoposide dose by 20% 1 level higher with C2 and monitor labs closely. PET/CT in 2 weeks If good tolerance to systemic therapy and adequate PET/CT response will consider IT MTX for CNS prophylaxis from [place] [facility] [place]

## 2020-05-13 ENCOUNTER — Other Ambulatory Visit: Payer: Self-pay

## 2020-05-13 DIAGNOSIS — Z5111 Encounter for antineoplastic chemotherapy: Secondary | ICD-10-CM | POA: Diagnosis not present

## 2020-05-13 DIAGNOSIS — C8338 Diffuse large B-cell lymphoma, lymph nodes of multiple sites: Secondary | ICD-10-CM | POA: Diagnosis not present

## 2020-05-13 LAB — COMPREHENSIVE METABOLIC PANEL
ALT: 40 U/L (ref 0–44)
AST: 22 U/L (ref 15–41)
Albumin: 3.1 g/dL — ABNORMAL LOW (ref 3.5–5.0)
Alkaline Phosphatase: 110 U/L (ref 38–126)
Anion gap: 10 (ref 5–15)
BUN: 13 mg/dL (ref 8–23)
CO2: 21 mmol/L — ABNORMAL LOW (ref 22–32)
Calcium: 8.7 mg/dL — ABNORMAL LOW (ref 8.9–10.3)
Chloride: 106 mmol/L (ref 98–111)
Creatinine, Ser: 0.54 mg/dL — ABNORMAL LOW (ref 0.61–1.24)
GFR, Estimated: >60 mL/min (ref >60)
Glucose, Bld: 124 mg/dL — ABNORMAL HIGH (ref 70–99)
Potassium: 4 mmol/L (ref 3.5–5.1)
Sodium: 137 mmol/L (ref 135–145)
Total Bilirubin: 0.3 mg/dL (ref 0.3–1.2)
Total Protein: 6.4 g/dL — ABNORMAL LOW (ref 6.5–8.1)

## 2020-05-13 LAB — CBC WITH DIFFERENTIAL/PLATELET
Abs Immature Granulocytes: 2.3 10*3/uL — ABNORMAL HIGH (ref 0.00–0.07)
Band Neutrophils: 6 %
Basophils Absolute: 0 10*3/uL (ref 0.0–0.1)
Basophils Relative: 0 %
Eosinophils Absolute: 0 10*3/uL (ref 0.0–0.5)
Eosinophils Relative: 0 %
HCT: 32.8 % — ABNORMAL LOW (ref 39.0–52.0)
Hemoglobin: 11.2 g/dL — ABNORMAL LOW (ref 13.0–17.0)
Lymphocytes Relative: 5 %
Lymphs Abs: 1.6 10*3/uL (ref 0.7–4.0)
MCH: 29.9 pg (ref 26.0–34.0)
MCHC: 34.1 g/dL (ref 30.0–36.0)
MCV: 87.7 fL (ref 80.0–100.0)
Metamyelocytes Relative: 2 %
Monocytes Absolute: 0.7 10*3/uL (ref 0.1–1.0)
Monocytes Relative: 2 %
Myelocytes: 5 %
Neutro Abs: 28.2 10*3/uL — ABNORMAL HIGH (ref 1.7–7.7)
Neutrophils Relative %: 80 %
Platelets: 354 10*3/uL (ref 150–400)
RBC: 3.74 MIL/uL — ABNORMAL LOW (ref 4.22–5.81)
RDW: 12.3 % (ref 11.5–15.5)
WBC: 32.8 10*3/uL — ABNORMAL HIGH (ref 4.0–10.5)
nRBC: 0 % (ref 0.0–0.2)

## 2020-05-13 LAB — LACTATE DEHYDROGENASE
LDH: 173 U/L (ref 98–192)
LDH: 177 U/L (ref 98–192)

## 2020-05-13 LAB — URIC ACID: Uric Acid, Serum: 4.9 mg/dL (ref 3.7–8.6)

## 2020-05-13 MED ORDER — SODIUM CHLORIDE 0.9 % IV SOLN
Freq: Once | INTRAVENOUS | Status: AC
  Administered 2020-05-13: 8 mg via INTRAVENOUS
  Filled 2020-05-13: qty 4

## 2020-05-13 MED ORDER — VINCRISTINE SULFATE CHEMO INJECTION 1 MG/ML
Freq: Once | INTRAVENOUS | Status: AC
  Filled 2020-05-13: qty 10

## 2020-05-13 NOTE — Progress Notes (Signed)
Rapid Infusion Rituximab Pharmacist Evaluation  Bryan [name] is a 64 y.o. male being treated with rituximab for DLBCL. This patient may be considered for RIR.   A pharmacist has verified the patient tolerated rituximab infusions per the [facility] standard infusion protocol without grade 3-4 infusion reactions. The treatment plan will be updated to reflect RIR if the patient qualifies per the checklist below:   Age > 27 years old Yes   Clinically significant cardiovascular disease No   Circulating lymphocyte count < 5000/uL prior to cycle two Yes  Lab Results  Component Value Date   LYMPHSABS 1.6 05/13/2020    Prior documented grade 3-4 infusion reaction to rituximab No   Prior documented grade 1-2 infusion reaction to rituximab (If YES, Pharmacist will confirm with Physician if patient is still a candidate for RIR) No   Previous rituximab infusion within the past 6 months Yes   Treatment Plan updated orders to reflect RIR Yes    Bryan [name] does meet the criteria for Rapid Infusion Rituximab. This patient is going to be switched to rapid infusion rituximab.    Bryan [name], Pharm.D., CPP 05/13/2020@12 :38 PM

## 2020-05-13 NOTE — Progress Notes (Addendum)
HEMATOLOGY-ONCOLOGY PROGRESS NOTE  SUBJECTIVE: Tolerated day 1 of chemotherapy well so far.  Reported mild nausea without vomiting.  Still has arthralgias and is using Tylenol 3 is effective.  Denies mucositis.  Bowels are moving well with scheduled MiraLAX and Senokot-S.    Oncology History  Diffuse large B cell lymphoma (HCC)  04/16/2020 Initial Diagnosis   Diffuse large B cell lymphoma ([place])   04/21/2020 -  Chemotherapy   Bryan Bryan had dexamethasone (DECADRON) 4 MG tablet, 8 mg, Oral, 2 times daily with meals, 1 of 1 cycle, Start date: --, End date: -- DOXOrubicin (ADRIAMYCIN) 18 mg, etoposide (VEPESID) 86 mg, vinCRIStine (ONCOVIN) 0.7 mg in sodium chloride 0.9 % 1,000 mL chemo infusion, , Intravenous, Once, 2 of 4 cycles Administration:  (04/21/2020),  (04/22/2020),  (05/12/2020),  (04/23/2020),  (04/24/2020) ondansetron (ZOFRAN) 8 mg, dexamethasone (DECADRON) 10 mg in sodium chloride 0.9 % 50 mL IVPB, , Intravenous,  Once, 2 of 4 cycles Administration: 18 mg (04/21/2020), 18 mg (04/22/2020), 16 mg (04/25/2020), 18 mg (05/12/2020), 8 mg (04/23/2020), 8 mg (04/24/2020) cyclophosphamide (CYTOXAN) 1,300 mg in sodium chloride 0.9 % 250 mL chemo infusion, 750 mg/m2 = 1,300 mg, Intravenous,  Once, 2 of 4 cycles Administration: 1,300 mg (04/25/2020)  for chemotherapy treatment.    04/28/2020 -  Chemotherapy   Bryan Bryan had pegfilgrastim-cbqv (UDENYCA) injection 6 mg, 6 mg, Subcutaneous, Once, 1 of 6 cycles riTUXimab-pvvr (RUXIENCE) 600 mg in sodium chloride 0.9 % 250 mL (1.9355 mg/mL) infusion, 375 mg/m2 = 600 mg, Intravenous,  Once, 1 of 6 cycles Administration: 600 mg (04/28/2020)  for chemotherapy treatment.       REVIEW OF SYSTEMS:   Constitutional: Denies fevers, chills Eyes: Denies blurriness of vision Ears, nose, mouth, throat, and face: Denies mucositis or sore throat Respiratory: Denies cough, dyspnea or wheezes Cardiovascular: Denies palpitation, chest  discomfort Gastrointestinal: Reports mild nausea without vomiting.  Denies constipation. Skin: Denies abnormal skin rashes Lymphatics: Denies new lymphadenopathy or easy bruising Neurological:Denies numbness, tingling or new weaknesses Behavioral/Psych: Mood is stable, no new changes  Extremities: No lower extremity edema All other systems were reviewed with Bryan Bryan and are negative.  I have reviewed Bryan past medical history, past surgical history, social history and family history with Bryan Bryan and they are unchanged from previous note.   PHYSICAL EXAMINATION: ECOG PERFORMANCE STATUS: 1 - Symptomatic but completely ambulatory  Vitals:   05/13/20 0028 05/13/20 0448  BP: 106/69 111/67  Pulse: (!) 56 75  Resp: 16 16  Temp: (!) 97.5 F (36.4 C) 97.6 F (36.4 C)  SpO2: 97% 98%   Filed Weights   05/13/20 0259  Weight: 61.1 kg    Intake/Output from previous day: 12/06 0701 - 12/07 0700 In: 647.7 [I.V.:181.9; IV Piggyback:465.8] Out: -   GENERAL:alert, no distress and comfortable SKIN: skin color, texture, turgor are normal, no rashes or significant lesions EYES: normal, Conjunctiva are pink and non-injected, sclera clear OROPHARYNX:no exudate, no erythema and lips, buccal mucosa, and tongue normal  NECK: supple, thyroid normal size, non-tender, without nodularity LYMPH:  no palpable lymphadenopathy in Bryan cervical, axillary or inguinal LUNGS: clear to auscultation and percussion with normal breathing effort HEART: regular rate & rhythm and no murmurs and no lower extremity edema ABDOMEN:abdomen soft, non-tender and normal bowel sounds Musculoskeletal:no cyanosis of digits and no clubbing  NEURO: alert & oriented x 3 with fluent speech, no focal motor/sensory deficits  LABORATORY DATA:  I have reviewed Bryan data as listed CMP Latest Ref Rng &  Units 05/13/2020 05/12/2020 05/05/2020  Glucose 70 - 99 mg/dL 124(H) 101(H) 95  BUN 8 - 23 mg/dL 13 12 7(L)  Creatinine 0.61 -  1.24 mg/dL 0.54(L) 0.61 0.88  Sodium 135 - 145 mmol/L 137 138 138  Potassium 3.5 - 5.1 mmol/L 4.0 4.0 3.8  Chloride 98 - 111 mmol/L 106 104 102  CO2 22 - 32 mmol/L 21(L) 24 25  Calcium 8.9 - 10.3 mg/dL 8.7(L) 9.1 9.5  Total Protein 6.5 - 8.1 g/dL 6.4(L) 6.3(L) 6.6  Total Bilirubin 0.3 - 1.2 mg/dL 0.3 0.4 0.2(L)  Alkaline Phos 38 - 126 U/L 110 112 143(H)  AST 15 - 41 U/L 22 23 61(H)  ALT 0 - 44 U/L 40 45(H) 110(H)    Lab Results  Component Value Date   WBC 32.8 (H) 05/13/2020   HGB 11.2 (L) 05/13/2020   HCT 32.8 (L) 05/13/2020   MCV 87.7 05/13/2020   PLT 354 05/13/2020   NEUTROABS 28.2 (H) 05/13/2020    MR Brain W Wo Contrast  Result Date: 04/18/2020 CLINICAL DATA:  Headache, new or worsening, Hematologic malignancy, staging Bryan with newly diagnosed large B cell lymphoma with new headaches evaluation for CNS involvement EXAM: MRI HEAD WITHOUT AND WITH CONTRAST TECHNIQUE: Multiplanar, multiecho pulse sequences of Bryan brain and surrounding structures were obtained without and with intravenous contrast. CONTRAST:  63m GADAVIST GADOBUTROL 1 MMOL/ML IV SOLN COMPARISON:  None. FINDINGS: Brain: No diffusion-weighted signal abnormality. No intracranial hemorrhage. No midline shift, ventriculomegaly or extra-axial fluid collection. No mass lesion. No abnormal enhancement. Cerebral volume is within normal limits. Minimal chronic microvascular ischemic changes. Vascular: Normal flow voids. Skull and upper cervical spine: Normal marrow signal. Sinuses/Orbits: Normal orbits. Small right maxillary and nasopharyngeal mucous retention cysts. Small right mastoid effusion. Other: None. IMPRESSION: No acute intracranial process. No evidence of metastatic disease. Electronically Signed   By: CPrimitivo GauzeM.D.   On: 04/18/2020 12:01   ECHOCARDIOGRAM COMPLETE  Result Date: 04/21/2020    ECHOCARDIOGRAM REPORT   Bryan Name:   Bryan SAUTTERDate of Exam: 04/21/2020 Medical Rec #:  [id number]     Height:       68.0 in Accession #:    [id number]   Weight:       137.1 lb Date of Birth:  113-Apr-1957   BSA:          1.741 m Bryan Age:    89years      BP:           107/70 mmHg Bryan Gender: M             HR:           58 bpm. Exam Location:  Inpatient Procedure: 2D Echo STAT ECHO Indications:   chemotherapy evaluation  History:       Bryan has no prior history of Echocardiogram examinations.                Cancer.  Sonographer:   VJannett CelestineRDCS (AE) Referring      3[place]adybrookPhys:  Sonographer Comments: Image acquisition challenging due to Bryan body habitus. IMPRESSIONS  1. Left ventricular ejection fraction, by estimation, is 65 to 70%. Bryan left ventricle has normal function. Bryan left ventricle has no regional wall motion abnormalities. Left ventricular diastolic function could not be evaluated.  2. Right ventricular systolic function is normal. Bryan right ventricular size is normal.  3. Bryan mitral valve is normal in structure. Trivial  mitral valve regurgitation. No evidence of mitral stenosis.  4. Bryan aortic valve is normal in structure. Aortic valve regurgitation is not visualized. No aortic stenosis is present.  5. Bryan inferior vena cava is normal in size with greater than 50% respiratory variability, suggesting right atrial pressure of 3 mmHg. FINDINGS  Left Ventricle: Left ventricular ejection fraction, by estimation, is 65 to 70%. Bryan left ventricle has normal function. Bryan left ventricle has no regional wall motion abnormalities. Bryan left ventricular internal cavity size was normal in size. There is  no left ventricular hypertrophy. Left ventricular diastolic function could not be evaluated. Right Ventricle: Bryan right ventricular size is normal. No increase in right ventricular wall thickness. Right ventricular systolic function is normal. Left Atrium: Left atrial size was normal in size. Right Atrium: Right atrial size was normal in size. Pericardium: There is no evidence of  pericardial effusion. Mitral Valve: Bryan mitral valve is normal in structure. Trivial mitral valve regurgitation. No evidence of mitral valve stenosis. Tricuspid Valve: Bryan tricuspid valve is normal in structure. Tricuspid valve regurgitation is trivial. No evidence of tricuspid stenosis. Aortic Valve: Bryan aortic valve is normal in structure. Aortic valve regurgitation is not visualized. No aortic stenosis is present. Pulmonic Valve: Bryan pulmonic valve was normal in structure. Pulmonic valve regurgitation is trivial. No evidence of pulmonic stenosis. Aorta: Bryan aortic root is normal in size and structure. Venous: Bryan inferior vena cava is normal in size with greater than 50% respiratory variability, suggesting right atrial pressure of 3 mmHg. IAS/Shunts: There is right bowing of Bryan interatrial septum, suggestive of elevated left atrial pressure. No atrial level shunt detected by color flow Doppler. Candee Furbish MD Electronically signed by Candee Furbish MD Signature Date/Time: 04/21/2020/11:53:58 AM    Final    IR IMAGING GUIDED PORT INSERTION  Result Date: 05/08/2020 INDICATION: 64 year old male with diffuse large B-cell lymphoma. He requires port catheter placement for durable IV access. EXAM: IMPLANTED PORT A CATH PLACEMENT WITH ULTRASOUND AND FLUOROSCOPIC GUIDANCE MEDICATIONS: 2 g Ancef; Bryan antibiotic was administered within an appropriate time interval prior to skin puncture. ANESTHESIA/SEDATION: Versed 4 mg IV; Fentanyl 100 mcg IV; Moderate Sedation Time:  26 minutes Bryan Bryan was continuously monitored during Bryan procedure by Bryan interventional radiology nurse under my direct supervision. FLUOROSCOPY TIME:  1 minutes, 12 seconds (9 mGy) COMPLICATIONS: None immediate. PROCEDURE: Bryan right neck and chest was prepped with chlorhexidine, and draped in Bryan usual sterile fashion using maximum barrier technique (cap and mask, sterile gown, sterile gloves, large sterile sheet, hand hygiene and cutaneous  antiseptic). Local anesthesia was attained by infiltration with 1% lidocaine with epinephrine. Ultrasound demonstrated patency of Bryan right internal jugular vein, and this was documented with an image. Under real-time ultrasound guidance, this vein was accessed with a 21 gauge micropuncture needle and image documentation was performed. A small dermatotomy was made at Bryan access site with an 11 scalpel. A 0.018" wire was advanced into Bryan SVC and Bryan access needle exchanged for a 80F micropuncture vascular sheath. Bryan 0.018" wire was then removed and a 0.035" wire advanced into Bryan IVC. An appropriate location for Bryan subcutaneous reservoir was selected below Bryan clavicle and an incision was made through Bryan skin and underlying soft tissues. Bryan subcutaneous tissues were then dissected using a combination of blunt and sharp surgical technique and a pocket was formed. A single lumen power injectable portacatheter was then tunneled through Bryan subcutaneous tissues from Bryan pocket to Bryan dermatotomy and Bryan port reservoir  placed within Bryan subcutaneous pocket. Bryan venous access site was then serially dilated and a peel away vascular sheath placed over Bryan wire. Bryan wire was removed and Bryan port catheter advanced into position under fluoroscopic guidance. Bryan catheter tip is positioned in Bryan superior cavoatrial junction. This was documented with a spot image. Bryan portacatheter was then tested and found to flush and aspirate well. Bryan port was flushed with saline followed by 100 units/mL heparinized saline. Bryan pocket was then closed in two layers using first subdermal inverted interrupted absorbable sutures followed by a running subcuticular suture. Bryan epidermis was then sealed with Dermabond. Bryan dermatotomy at Bryan venous access site was also closed with Dermabond. IMPRESSION: Successful placement of a right IJ approach Power Port with ultrasound and fluoroscopic guidance. Bryan catheter is ready for use. Electronically  Signed   By: Jacqulynn Cadet M.D.   On: 05/08/2020 15:34   IR PICC PLACEMENT RIGHT >5 YRS INC IMG GUIDE  Result Date: 04/21/2020 INDICATION: History of lymphoma. In need of durable intravenous access for Bryan initiation chemotherapy. EXAM: ULTRASOUND AND FLUOROSCOPIC GUIDED PICC LINE INSERTION MEDICATIONS: None. CONTRAST:  Cc Omnipaque 300 FLUOROSCOPY TIME:  18 seconds COMPLICATIONS: None immediate. TECHNIQUE: Bryan procedure, risks, benefits, and alternatives were explained to Bryan Bryan and informed written consent was obtained. A timeout was performed prior to Bryan initiation of Bryan procedure. Bryan right upper extremity was prepped with chlorhexidine in a sterile fashion, and a sterile drape was applied covering Bryan operative field. Maximum barrier sterile technique with sterile gowns and gloves were used for Bryan procedure. A timeout was performed prior to Bryan initiation of Bryan procedure. Local anesthesia was provided with 1% lidocaine. Under direct ultrasound guidance, Bryan brachial vein was accessed with a micropuncture kit after Bryan overlying soft tissues were anesthetized with 1% lidocaine. After Bryan overlying soft tissues were anesthetized, a small venotomy incision was created and a micropuncture kit was utilized to access Bryan right brachial vein. Real-time ultrasound guidance was utilized for vascular access including Bryan acquisition of a permanent ultrasound image documenting patency of Bryan accessed vessel. A guidewire was advanced to Bryan level of Bryan level of Bryan axilla and a peel-away sheath was placed. As there was difficulty advancing Bryan microwire centrally, a limited venogram was performed via Bryan micropuncture sheath demonstrating patency of Bryan right upper extremity central venous system. Ultimately, a microwire was advanced to Bryan level of Bryan superior cavoatrial junction and utilized for measurement purposes. Next, a 38 cm, 5 [place], dual lumen was inserted to level of Bryan superior  caval-atrial junction. A post procedure spot fluoroscopic was obtained. Bryan catheter easily aspirated and flushed and was secured in place with stat lock device. A dressing was applied. Bryan Bryan tolerated Bryan procedure well without immediate post procedural complication. FINDINGS: After catheter placement, Bryan tip lies within Bryan superior cavoatrial junction. Bryan catheter aspirates and flushes normally and is ready for immediate use. IMPRESSION: Successful ultrasound and fluoroscopic guided placement of a right brachial vein approach, cm, 5 French, dual lumen PICC with tip at Bryan superior caval-atrial junction. Bryan PICC line is ready for immediate use. Electronically Signed   By: Sandi Mariscal M.D.   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 Intensely hypermetabolic bilateral neck, bilateral axillary, bilateral mediastinal, bilateral hilar and retroperitoneal lymphadenopathy. Numerous small hypermetabolic splenic masses. Findings are compatible with  malignancy, favoring lymphoproliferative disorder." PLAN -Bryan Bryan tolerated day 1 of cycle one of his chemotherapy well overall.  Reports mild nausea.   -CBC from this morning has been reviewed.  He has leukocytosis due to prednisone and recent Neulasta.  He has mild anemia.  Labs are adequate to continue with chemotherapy. -Continue to check daily CBC with differential, CMET, LDH, uric acid. -Continue allopurinol 100 mg twice a day. -Continue as needed antiemetics and bowel regimen. -Continue home pain medication. -Continue Lovenox for DVT prophylaxis. -Bryan Bryan will have rituximab and G-CSF as an outpatient on 05/19/2020.  He will have a lab and follow-up visit for toxicity check on 05/26/2020.   LOS: 1 day   Mikey Bussing, DNP, AGPCNP-BC, AOCNP 05/13/20  ADDENDUM .Bryan was Personally and independently  interviewed, examined and relevant elements of Bryan history of present illness were reviewed in details and an assessment and plan was created. All elements of Bryan Bryan's history of present illness , assessment and plan were discussed in details with Mikey Bussing, DNP, AGPCNP-BC, AOCNP. Bryan above documentation reflects our combined findings assessment and plan. Bryan in good spirits. Labs stable. No acute new toxicities. Will plan to discontinue Allopurinol after this cycle of treatment if no issues with TLS.  Sullivan Lone MD MS

## 2020-05-14 ENCOUNTER — Other Ambulatory Visit: Payer: Self-pay | Admitting: *Deleted

## 2020-05-14 DIAGNOSIS — Z5111 Encounter for antineoplastic chemotherapy: Secondary | ICD-10-CM | POA: Diagnosis not present

## 2020-05-14 DIAGNOSIS — C8338 Diffuse large B-cell lymphoma, lymph nodes of multiple sites: Secondary | ICD-10-CM | POA: Diagnosis not present

## 2020-05-14 LAB — CBC WITH DIFFERENTIAL/PLATELET
Abs Immature Granulocytes: 2.39 10*3/uL — ABNORMAL HIGH (ref 0.00–0.07)
Basophils Absolute: 0.2 10*3/uL — ABNORMAL HIGH (ref 0.0–0.1)
Basophils Relative: 0 %
Eosinophils Absolute: 0 10*3/uL (ref 0.0–0.5)
Eosinophils Relative: 0 %
HCT: 32.6 % — ABNORMAL LOW (ref 39.0–52.0)
Hemoglobin: 11.1 g/dL — ABNORMAL LOW (ref 13.0–17.0)
Immature Granulocytes: 7 %
Lymphocytes Relative: 3 %
Lymphs Abs: 1 10*3/uL (ref 0.7–4.0)
MCH: 29.8 pg (ref 26.0–34.0)
MCHC: 34 g/dL (ref 30.0–36.0)
MCV: 87.4 fL (ref 80.0–100.0)
Monocytes Absolute: 1.4 10*3/uL — ABNORMAL HIGH (ref 0.1–1.0)
Monocytes Relative: 4 %
Neutro Abs: 31.7 10*3/uL — ABNORMAL HIGH (ref 1.7–7.7)
Neutrophils Relative %: 86 %
Platelets: 417 10*3/uL — ABNORMAL HIGH (ref 150–400)
RBC: 3.73 MIL/uL — ABNORMAL LOW (ref 4.22–5.81)
RDW: 12.4 % (ref 11.5–15.5)
WBC: 36.6 10*3/uL — ABNORMAL HIGH (ref 4.0–10.5)
nRBC: 0 % (ref 0.0–0.2)

## 2020-05-14 LAB — COMPREHENSIVE METABOLIC PANEL
ALT: 42 U/L (ref 0–44)
AST: 27 U/L (ref 15–41)
Albumin: 3.2 g/dL — ABNORMAL LOW (ref 3.5–5.0)
Alkaline Phosphatase: 101 U/L (ref 38–126)
Anion gap: 10 (ref 5–15)
BUN: 17 mg/dL (ref 8–23)
CO2: 21 mmol/L — ABNORMAL LOW (ref 22–32)
Calcium: 8.9 mg/dL (ref 8.9–10.3)
Chloride: 108 mmol/L (ref 98–111)
Creatinine, Ser: 0.64 mg/dL (ref 0.61–1.24)
GFR, Estimated: >60 mL/min (ref >60)
Glucose, Bld: 109 mg/dL — ABNORMAL HIGH (ref 70–99)
Potassium: 3.8 mmol/L (ref 3.5–5.1)
Sodium: 139 mmol/L (ref 135–145)
Total Bilirubin: 0.5 mg/dL (ref 0.3–1.2)
Total Protein: 6.1 g/dL — ABNORMAL LOW (ref 6.5–8.1)

## 2020-05-14 LAB — LACTATE DEHYDROGENASE: LDH: 168 U/L (ref 98–192)

## 2020-05-14 LAB — URIC ACID: Uric Acid, Serum: 4.3 mg/dL (ref 3.7–8.6)

## 2020-05-14 MED ORDER — SODIUM CHLORIDE 0.9 % IV SOLN
Freq: Once | INTRAVENOUS | Status: AC
  Administered 2020-05-14: 8 mg via INTRAVENOUS
  Filled 2020-05-14: qty 4

## 2020-05-14 MED ORDER — VINCRISTINE SULFATE CHEMO INJECTION 1 MG/ML
Freq: Once | INTRAVENOUS | Status: AC
  Filled 2020-05-14: qty 10

## 2020-05-14 MED ORDER — ZIEXTENZO 6 MG/0.6ML ~~LOC~~ SOSY: 6.0000 mg | PREFILLED_SYRINGE | SUBCUTANEOUS | 4 refills | Status: DC

## 2020-05-14 NOTE — Progress Notes (Signed)
HEMATOLOGY-ONCOLOGY PROGRESS NOTE  SUBJECTIVE:  Patient was seen in follow-up for large B-cell lymphoma getting his third day of cycle 2 of EPOCH-R. Tolerated cycle 2-day 2 without any significant issues. In good spirits and determined to put this behind him. Good p.o. intake. Minimal grade 1 nausea controlled with medications. No shortness of breath no chest pain. No constipation or diarrhea.   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9 % 50 mL IVPB, , Intravenous,  Once, 2 of 4 cycles Administration: 18 mg (04/21/2020), 18 mg (04/22/2020), 16 mg (04/25/2020), 18 mg (05/12/2020), 8 mg (05/13/2020), 8 mg (04/23/2020), 8 mg (04/24/2020) cyclophosphamide (CYTOXAN) 1,300 mg in sodium chloride 0.9 % 250 mL chemo infusion, 750 mg/m2 = 1,300 mg, Intravenous,  Once, 2 of 4 cycles Administration: 1,300 mg (04/25/2020)  for chemotherapy treatment.    04/28/2020 -  Chemotherapy   The patient had pegfilgrastim-cbqv (UDENYCA) injection 6 mg, 6 mg, Subcutaneous, Once, 1 of 1 cycle riTUXimab-pvvr (RUXIENCE) 600 mg in sodium chloride 0.9 % 250 mL (1.9355 mg/mL) infusion, 375 mg/m2 = 600 mg, Intravenous,  Once, 1 of 1 cycle Administration: 600 mg (04/28/2020)  for chemotherapy treatment.       REVIEW OF SYSTEMS:   .10 Point review of Systems was done is negative except as noted above.   I have reviewed the  past medical history, past surgical history, social history and family history with the patient and they are unchanged from previous note.   PHYSICAL EXAMINATION: ECOG PERFORMANCE STATUS: 1 - Symptomatic but completely ambulatory  Vitals:   05/13/20 2140 05/14/20 0555  BP: 113/67 112/70  Pulse: (!) 59 60  Resp: 14 14  Temp: (!) 97.4 F (36.3 C) 97.7 F (36.5 C)  SpO2: 97% 99%   Filed Weights   05/13/20 0259  Weight: 134 lb 11.2 oz (61.1 kg)    Intake/Output from previous day: 12/07 0701 - 12/08 0700 In: 1100.2 [I.V.:480.5; IV Piggyback:619.7] Out: -  . GENERAL:alert, in no acute distress and comfortable SKIN: no acute rashes, no significant lesions EYES: conjunctiva are pink and non-injected, sclera anicteric OROPHARYNX: MMM, no exudates, no oropharyngeal erythema or ulceration NECK: supple, no JVD LYMPH:  no palpable lymphadenopathy in the cervical, axillary or inguinal regions LUNGS: clear to auscultation b/l with normal respiratory effort HEART: regular rate & rhythm ABDOMEN:  normoactive bowel sounds , non tender, not distended. Extremity: no pedal edema PSYCH: alert & oriented x 3 with fluent speech NEURO: no focal motor/sensory deficits  LABORATORY DATA:  I have reviewed the data as listed CMP Latest Ref Rng & Units 05/14/2020 05/13/2020 05/12/2020  Glucose 70 - 99 mg/dL 109(H) 124(H) 101(H)  BUN 8 - 23 mg/dL _0 Creatinine 0.61 - 1.24 mg/dL 0.64 0.54(L) 0.61  Sodium 135 - 145 mmol/L 139 137 138  Potassium 3.5 - 5.1 mmol/L 3.8 4.0 4.0  Chloride 98 - 111 mmol/L 108 106 104  CO2 22 - 32 mmol/L 21(L) 21(L) 24  Calcium 8.9 - 10.3 mg/dL 8.9 8.7(L) 9.1  Total Protein 6.5 - 8.1 g/dL 6.1(L) 6.4(L) 6.3(L)  Total Bilirubin 0.3 - 1.2 mg/dL 0.5 0.3 0.4  Alkaline Phos 38 - 126 U/L 101 110 112  AST 15 - 41 U/L _0 ALT 0 - 44 U/L 42 40 45(H)   . CBC Latest Ref Rng & Units 05/14/2020 05/13/2020 05/12/2020  WBC 4.0 - 10.5 K/uL 36.6(H) 32.8(H) 29.1(H)  Hemoglobin  13.0 - 17.0 g/dL 11.1(L) 11.2(L) 11.7(L)  Hematocrit 39 - 52 % 32.6(L) 32.8(L) 34.1(L)  Platelets 150 - 400 K/uL 417(H) 354 331   . Lab Results  Component Value Date   LDH 168 05/14/2020   Uric acid 4.3    MR Brain W Wo Contrast  Result Date: 04/18/2020 CLINICAL DATA:  Headache, new or worsening, Hematologic malignancy, staging Patient with newly diagnosed large B cell lymphoma with new headaches evaluation for CNS involvement EXAM: MRI HEAD WITHOUT AND WITH CONTRAST TECHNIQUE: Multiplanar, multiecho pulse sequences of the brain and surrounding structures were obtained without and with intravenous contrast. CONTRAST:  49m GADAVIST GADOBUTROL 1 MMOL/ML IV SOLN COMPARISON:  None. FINDINGS: Brain: No diffusion-weighted signal abnormality. No intracranial hemorrhage. No midline shift, ventriculomegaly or extra-axial fluid collection. No mass lesion. No abnormal enhancement. Cerebral volume is within normal limits. Minimal chronic microvascular ischemic changes. Vascular: Normal flow voids. Skull and upper cervical spine: Normal marrow signal. Sinuses/Orbits: Normal orbits. Small right maxillary and nasopharyngeal mucous retention cysts. Small right mastoid effusion. Other: None. IMPRESSION: No acute intracranial process. No evidence of metastatic disease. Electronically Signed   By: CPrimitivo GauzeM.D.   On: 04/18/2020 12:01   ECHOCARDIOGRAM COMPLETE  Result Date: 04/21/2020    ECHOCARDIOGRAM REPORT   Patient Name:   TMELQUISEDEC JOURNEYDate of Exam: 04/21/2020 Medical Rec #:  [id number]    Height:       68.0 in Accession #:    [id number]   Weight:       137.1 lb Date of Birth:  [birth year]   BSA:          1.741 m Patient Age:    89years      BP:           107/70 mmHg Patient Gender: M             HR:           58 bpm. Exam Location:  Inpatient Procedure: 2D Echo STAT ECHO Indications:   chemotherapy evaluation  History:       Patient has no prior history of Echocardiogram examinations.                 Cancer.  Sonographer:   VJannett CelestineRDCS (AE) Referring      3[place] Valley EstatesPhys:  Sonographer Comments: Image acquisition challenging due to patient body habitus. IMPRESSIONS  1. Left ventricular ejection fraction, by estimation, is 65 to 70%. The left ventricle has normal function. The left ventricle has no regional wall motion abnormalities. Left ventricular diastolic function could not be evaluated.  2. Right ventricular systolic function is normal. The right ventricular size is normal.  3. The mitral valve is normal in structure. Trivial mitral valve regurgitation. No evidence of mitral stenosis.  4. The aortic valve is normal in structure. Aortic valve regurgitation is not visualized. No aortic stenosis is present.  5. The inferior vena cava is  normal in size with greater than 50% respiratory variability, suggesting right atrial pressure of 3 mmHg. FINDINGS  Left Ventricle: Left ventricular ejection fraction, by estimation, is 65 to 70%. The left ventricle has normal function. The left ventricle has no regional wall motion abnormalities. The left ventricular internal cavity size was normal in size. There is  no left ventricular hypertrophy. Left ventricular diastolic function could not be evaluated. Right Ventricle: The right ventricular size is normal. No increase in right ventricular wall thickness. Right ventricular systolic function is normal. Left Atrium: Left atrial size was normal in size. Right Atrium: Right atrial size was normal in size. Pericardium: There is no evidence of pericardial effusion. Mitral Valve: The mitral valve is normal in structure. Trivial mitral valve regurgitation. No evidence of mitral valve stenosis. Tricuspid Valve: The tricuspid valve is normal in structure. Tricuspid valve regurgitation is trivial. No evidence of tricuspid stenosis. Aortic Valve: The aortic valve is normal in structure. Aortic valve regurgitation is not visualized. No aortic stenosis is present.  Pulmonic Valve: The pulmonic valve was normal in structure. Pulmonic valve regurgitation is trivial. No evidence of pulmonic stenosis. Aorta: The aortic root is normal in size and structure. Venous: The inferior vena cava is normal in size with greater than 50% respiratory variability, suggesting right atrial pressure of 3 mmHg. IAS/Shunts: There is right bowing of the interatrial septum, suggestive of elevated left atrial pressure. No atrial level shunt detected by color flow Doppler. Candee Furbish MD Electronically signed by Candee Furbish MD Signature Date/Time: 04/21/2020/11:53:58 AM    Final    IR IMAGING GUIDED PORT INSERTION  Result Date: 05/08/2020 INDICATION: 64 year old male with diffuse large B-cell lymphoma. He requires port catheter placement for durable IV access. EXAM: IMPLANTED PORT A CATH PLACEMENT WITH ULTRASOUND AND FLUOROSCOPIC GUIDANCE MEDICATIONS: 2 g Ancef; The antibiotic was administered within an appropriate time interval prior to skin puncture. ANESTHESIA/SEDATION: Versed 4 mg IV; Fentanyl 100 mcg IV; Moderate Sedation Time:  26 minutes The patient was continuously monitored during the procedure by the interventional radiology nurse under my direct supervision. FLUOROSCOPY TIME:  1 minutes, 12 seconds (9 mGy) COMPLICATIONS: None immediate. PROCEDURE: The right neck and chest was prepped with chlorhexidine, and draped in the usual sterile fashion using maximum barrier technique (cap and mask, sterile gown, sterile gloves, large sterile sheet, hand hygiene and cutaneous antiseptic). Local anesthesia was attained by infiltration with 1% lidocaine with epinephrine. Ultrasound demonstrated patency of the right internal jugular vein, and this was documented with an image. Under real-time ultrasound guidance, this vein was accessed with a 21 gauge micropuncture needle and image documentation was performed. A small dermatotomy was made at the access site with an 11 scalpel. A 0.018" wire was  advanced into the SVC and the access needle exchanged for a 14F micropuncture vascular sheath. The 0.018" wire was then removed and a 0.035" wire advanced into the IVC. An appropriate location for the subcutaneous reservoir was selected below the clavicle and an incision was made through the skin and underlying soft tissues. The subcutaneous tissues were then dissected using a combination of blunt and sharp surgical technique and a pocket was formed. A single lumen power injectable portacatheter was then tunneled through the subcutaneous tissues from the pocket to the dermatotomy and the port reservoir placed within the subcutaneous pocket. The venous access site was then serially dilated and a peel away vascular sheath placed over the wire. The wire was removed and the port catheter advanced into position under  fluoroscopic guidance. The catheter tip is positioned in the superior cavoatrial junction. This was documented with a spot image. The portacatheter was then tested and found to flush and aspirate well. The port was flushed with saline followed by 100 units/mL heparinized saline. The pocket was then closed in two layers using first subdermal inverted interrupted absorbable sutures followed by a running subcuticular suture. The epidermis was then sealed with Dermabond. The dermatotomy at the venous access site was also closed with Dermabond. IMPRESSION: Successful placement of a right IJ approach Power Port with ultrasound and fluoroscopic guidance. The catheter is ready for use. Electronically Signed   By: Jacqulynn Cadet M.D.   On: 05/08/2020 15:34   IR PICC PLACEMENT RIGHT >5 YRS INC IMG GUIDE  Result Date: 04/21/2020 INDICATION: History of lymphoma. In need of durable intravenous access for the initiation chemotherapy. EXAM: ULTRASOUND AND FLUOROSCOPIC GUIDED PICC LINE INSERTION MEDICATIONS: None. CONTRAST:  Cc Omnipaque 300 FLUOROSCOPY TIME:  18 seconds COMPLICATIONS: None immediate. TECHNIQUE: The  procedure, risks, benefits, and alternatives were explained to the patient and informed written consent was obtained. A timeout was performed prior to the initiation of the procedure. The right upper extremity was prepped with chlorhexidine in a sterile fashion, and a sterile drape was applied covering the operative field. Maximum barrier sterile technique with sterile gowns and gloves were used for the procedure. A timeout was performed prior to the initiation of the procedure. Local anesthesia was provided with 1% lidocaine. Under direct ultrasound guidance, the brachial vein was accessed with a micropuncture kit after the overlying soft tissues were anesthetized with 1% lidocaine. After the overlying soft tissues were anesthetized, a small venotomy incision was created and a micropuncture kit was utilized to access the right brachial vein. Real-time ultrasound guidance was utilized for vascular access including the acquisition of a permanent ultrasound image documenting patency of the accessed vessel. A guidewire was advanced to the level of the level of the axilla and a peel-away sheath was placed. As there was difficulty advancing the microwire centrally, a limited venogram was performed via the micropuncture sheath demonstrating patency of the right upper extremity central venous system. Ultimately, a microwire was advanced to the level of the superior cavoatrial junction and utilized for measurement purposes. Next, a 38 cm, 5 [place], dual lumen was inserted to level of the superior caval-atrial junction. A post procedure spot fluoroscopic was obtained. The catheter easily aspirated and flushed and was secured in place with stat lock device. A dressing was applied. The patient tolerated the procedure well without immediate post procedural complication. FINDINGS: After catheter placement, the tip lies within the superior cavoatrial junction. The catheter aspirates and flushes normally and is ready for immediate  use. IMPRESSION: Successful ultrasound and fluoroscopic guided placement of a right brachial vein approach, cm, 5 French, dual lumen PICC with tip at the superior caval-atrial junction. The PICC line is ready for immediate use. Electronically Signed   By: Sandi Mariscal M.D.   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 Intensely hypermetabolic bilateral neck, bilateral axillary, bilateral mediastinal, bilateral hilar and retroperitoneal lymphadenopathy. Numerous small hypermetabolic splenic masses. Findings are compatible with malignancy, favoring lymphoproliferative disorder." PLAN -The patient tolerated day 2 of cycle one of his chemotherapy well overall.  Reports mild nausea.   -CBC from this morning has been reviewed.  He has leukocytosis  due to prednisone and recent Neulasta. No clinical signs or symptoms of infection.  He has mild anemia.  Labs are adequate to continue with chemotherapy. -Continue to check daily CBC with differential, CMET, LDH, uric acid. -Continue allopurinol 100 mg twice a day. Will discontinue upon follow-up in clinic if no signs of tumor lysis. -Continue as needed antiemetics and bowel regimen. -Continue home pain medication. -Continue Lovenox for DVT prophylaxis. -The patient will have rituximab and G-CSF as an outpatient on 05/19/2020.  He will have a lab and follow-up visit for toxicity check on 05/26/2020.   LOS: 2 days   Sullivan Lone, MD MS 05/14/20

## 2020-05-15 LAB — CBC WITH DIFFERENTIAL/PLATELET
Abs Immature Granulocytes: 0.82 10*3/uL — ABNORMAL HIGH (ref 0.00–0.07)
Basophils Absolute: 0.1 10*3/uL (ref 0.0–0.1)
Basophils Relative: 0 %
Eosinophils Absolute: 0 10*3/uL (ref 0.0–0.5)
Eosinophils Relative: 0 %
HCT: 31 % — ABNORMAL LOW (ref 39.0–52.0)
Hemoglobin: 10.8 g/dL — ABNORMAL LOW (ref 13.0–17.0)
Immature Granulocytes: 4 %
Lymphocytes Relative: 5 %
Lymphs Abs: 0.9 10*3/uL (ref 0.7–4.0)
MCH: 30.3 pg (ref 26.0–34.0)
MCHC: 34.8 g/dL (ref 30.0–36.0)
MCV: 87.1 fL (ref 80.0–100.0)
Monocytes Absolute: 0.8 10*3/uL (ref 0.1–1.0)
Monocytes Relative: 4 %
Neutro Abs: 15.9 10*3/uL — ABNORMAL HIGH (ref 1.7–7.7)
Neutrophils Relative %: 87 %
Platelets: 353 10*3/uL (ref 150–400)
RBC: 3.56 MIL/uL — ABNORMAL LOW (ref 4.22–5.81)
RDW: 12.2 % (ref 11.5–15.5)
WBC: 18.6 10*3/uL — ABNORMAL HIGH (ref 4.0–10.5)
nRBC: 0 % (ref 0.0–0.2)

## 2020-05-15 LAB — URIC ACID: Uric Acid, Serum: 4.6 mg/dL (ref 3.7–8.6)

## 2020-05-15 LAB — COMPREHENSIVE METABOLIC PANEL
ALT: 45 U/L — ABNORMAL HIGH (ref 0–44)
AST: 32 U/L (ref 15–41)
Albumin: 2.9 g/dL — ABNORMAL LOW (ref 3.5–5.0)
Alkaline Phosphatase: 86 U/L (ref 38–126)
Anion gap: 10 (ref 5–15)
BUN: 17 mg/dL (ref 8–23)
CO2: 21 mmol/L — ABNORMAL LOW (ref 22–32)
Calcium: 8.4 mg/dL — ABNORMAL LOW (ref 8.9–10.3)
Chloride: 106 mmol/L (ref 98–111)
Creatinine, Ser: 0.69 mg/dL (ref 0.61–1.24)
GFR, Estimated: >60 mL/min (ref >60)
Glucose, Bld: 88 mg/dL (ref 70–99)
Potassium: 3.6 mmol/L (ref 3.5–5.1)
Sodium: 137 mmol/L (ref 135–145)
Total Bilirubin: 0.5 mg/dL (ref 0.3–1.2)
Total Protein: 5.5 g/dL — ABNORMAL LOW (ref 6.5–8.1)

## 2020-05-15 LAB — LACTATE DEHYDROGENASE: LDH: 181 U/L (ref 98–192)

## 2020-05-15 MED ORDER — SODIUM CHLORIDE 0.9 % IV SOLN
Freq: Once | INTRAVENOUS | Status: AC
  Administered 2020-05-16: 36 mg via INTRAVENOUS
  Filled 2020-05-15 (×2): qty 8

## 2020-05-15 MED ORDER — VINCRISTINE SULFATE CHEMO INJECTION 1 MG/ML
Freq: Once | INTRAVENOUS | Status: AC
  Filled 2020-05-15: qty 10

## 2020-05-15 MED ORDER — SODIUM CHLORIDE 0.9 % IV SOLN
Freq: Once | INTRAVENOUS | Status: AC
  Administered 2020-05-15: 18 mg via INTRAVENOUS
  Filled 2020-05-15: qty 4

## 2020-05-15 MED ORDER — SODIUM CHLORIDE 0.9 % IV SOLN
750.0000 mg/m2 | Freq: Once | INTRAVENOUS | Status: AC
  Administered 2020-05-16: 1300 mg via INTRAVENOUS
  Filled 2020-05-15: qty 65

## 2020-05-15 NOTE — Progress Notes (Addendum)
HEMATOLOGY-ONCOLOGY PROGRESS NOTE  SUBJECTIVE: Continues to tolerate his chemotherapy well overall.  Reports mild nausea but no vomiting.  Bowels are moving without any difficulty.  Denies mucositis.  Remains afebrile and other vital signs are stable.  Oncology History  Diffuse large B cell lymphoma (HCC)  04/16/2020 Initial Diagnosis   Diffuse large B cell lymphoma ([place])   04/21/2020 -  Chemotherapy   The patient had dexamethasone (DECADRON) 4 MG tablet, 8 mg, Oral, 2 times daily with meals, 1 of 1 cycle, Start date: --, End date: -- DOXOrubicin (ADRIAMYCIN) 18 mg, etoposide (VEPESID) 86 mg, vinCRIStine (ONCOVIN) 0.7 mg in sodium chloride 0.9 % 1,000 mL chemo infusion, , Intravenous, Once, 2 of 4 cycles Administration:  (04/21/2020),  (04/22/2020),  (05/12/2020),  (05/13/2020),  (04/23/2020),  (04/24/2020),  (05/14/2020) ondansetron (ZOFRAN) 8 mg, dexamethasone (DECADRON) 10 mg in sodium chloride 0.9 % 50 mL IVPB, , Intravenous,  Once, 2 of 4 cycles Administration: 18 mg (04/21/2020), 18 mg (04/22/2020), 16 mg (04/25/2020), 18 mg (05/12/2020), 8 mg (05/13/2020), 8 mg (04/23/2020), 8 mg (04/24/2020), 8 mg (05/14/2020) cyclophosphamide (CYTOXAN) 1,300 mg in sodium chloride 0.9 % 250 mL chemo infusion, 750 mg/m2 = 1,300 mg, Intravenous,  Once, 2 of 4 cycles Administration: 1,300 mg (04/25/2020)  for chemotherapy treatment.    04/28/2020 -  Chemotherapy   The patient had pegfilgrastim-cbqv (UDENYCA) injection 6 mg, 6 mg, Subcutaneous, Once, 1 of 1 cycle riTUXimab-pvvr (RUXIENCE) 600 mg in sodium chloride 0.9 % 250 mL (1.9355 mg/mL) infusion, 375 mg/m2 = 600 mg, Intravenous,  Once, 1 of 1 cycle Administration: 600 mg (04/28/2020)  for chemotherapy treatment.       REVIEW OF SYSTEMS:   Constitutional: Denies fevers, chills Eyes: Denies blurriness of vision Ears, nose, mouth, throat, and face: Denies mucositis or sore throat Respiratory: Denies cough, dyspnea or wheezes Cardiovascular: Denies  palpitation, chest discomfort Gastrointestinal: Reports mild nausea without vomiting.  Denies constipation. Skin: Denies abnormal skin rashes Lymphatics: Denies new lymphadenopathy or easy bruising Neurological:Denies numbness, tingling or new weaknesses Behavioral/Psych: Mood is stable, no new changes  Extremities: No lower extremity edema All other systems were reviewed with the patient and are negative.  I have reviewed the past medical history, past surgical history, social history and family history with the patient and they are unchanged from previous note.   PHYSICAL EXAMINATION: ECOG PERFORMANCE STATUS: 1 - Symptomatic but completely ambulatory  Vitals:   05/14/20 2119 05/15/20 0554  BP: 118/72 122/81  Pulse: 62 (!) 55  Resp: 16 14  Temp: 97.8 F (36.6 C) 97.7 F (36.5 C)  SpO2: 98% 97%   Filed Weights   05/13/20 0259  Weight: 61.1 kg    Intake/Output from previous day: 12/08 0701 - 12/09 0700 In: 360 [P.O.:360] Out: -   GENERAL:alert, no distress and comfortable SKIN: skin color, texture, turgor are normal, no rashes or significant lesions EYES: normal, Conjunctiva are pink and non-injected, sclera clear OROPHARYNX:no exudate, no erythema and lips, buccal mucosa, and tongue normal  NECK: supple, thyroid normal size, non-tender, without nodularity LYMPH:  no palpable lymphadenopathy in the cervical, axillary or inguinal LUNGS: clear to auscultation and percussion with normal breathing effort HEART: regular rate & rhythm and no murmurs and no lower extremity edema ABDOMEN:abdomen soft, non-tender and normal bowel sounds Musculoskeletal:no cyanosis of digits and no clubbing  NEURO: alert & oriented x 3 with fluent speech, no focal motor/sensory deficits  LABORATORY DATA:  I have reviewed the data as listed CMP Latest Ref Rng &  Units 05/15/2020 05/14/2020 05/13/2020  Glucose 70 - 99 mg/dL 88 109(H) 124(H)  BUN 8 - 23 mg/dL 17 17 13   Creatinine 0.61 - 1.24 mg/dL  0.69 0.64 0.54(L)  Sodium 135 - 145 mmol/L 137 139 137  Potassium 3.5 - 5.1 mmol/L 3.6 3.8 4.0  Chloride 98 - 111 mmol/L 106 108 106  CO2 22 - 32 mmol/L 21(L) 21(L) 21(L)  Calcium 8.9 - 10.3 mg/dL 8.4(L) 8.9 8.7(L)  Total Protein 6.5 - 8.1 g/dL 5.5(L) 6.1(L) 6.4(L)  Total Bilirubin 0.3 - 1.2 mg/dL 0.5 0.5 0.3  Alkaline Phos 38 - 126 U/L 86 101 110  AST 15 - 41 U/L 32 27 22  ALT 0 - 44 U/L 45(H) 42 40    Lab Results  Component Value Date   WBC 18.6 (H) 05/15/2020   HGB 10.8 (L) 05/15/2020   HCT 31.0 (L) 05/15/2020   MCV 87.1 05/15/2020   PLT 353 05/15/2020   NEUTROABS 15.9 (H) 05/15/2020    MR Brain W Wo Contrast  Result Date: 04/18/2020 CLINICAL DATA:  Headache, new or worsening, Hematologic malignancy, staging Patient with newly diagnosed large B cell lymphoma with new headaches evaluation for CNS involvement EXAM: MRI HEAD WITHOUT AND WITH CONTRAST TECHNIQUE: Multiplanar, multiecho pulse sequences of the brain and surrounding structures were obtained without and with intravenous contrast. CONTRAST:  34m GADAVIST GADOBUTROL 1 MMOL/ML IV SOLN COMPARISON:  None. FINDINGS: Brain: No diffusion-weighted signal abnormality. No intracranial hemorrhage. No midline shift, ventriculomegaly or extra-axial fluid collection. No mass lesion. No abnormal enhancement. Cerebral volume is within normal limits. Minimal chronic microvascular ischemic changes. Vascular: Normal flow voids. Skull and upper cervical spine: Normal marrow signal. Sinuses/Orbits: Normal orbits. Small right maxillary and nasopharyngeal mucous retention cysts. Small right mastoid effusion. Other: None. IMPRESSION: No acute intracranial process. No evidence of metastatic disease. Electronically Signed   By: CPrimitivo GauzeM.D.   On: 04/18/2020 12:01   ECHOCARDIOGRAM COMPLETE  Result Date: 04/21/2020    ECHOCARDIOGRAM REPORT   Patient Name:   TDENIS CARREONDate of Exam: 04/21/2020 Medical Rec #:  [id number]    Height:        68.0 in Accession #:    [id number]   Weight:       137.1 lb Date of Birth:  107-Dec-1957   BSA:          1.741 m Patient Age:    89years      BP:           107/70 mmHg Patient Gender: M             HR:           58 bpm. Exam Location:  Inpatient Procedure: 2D Echo STAT ECHO Indications:   chemotherapy evaluation  History:       Patient has no prior history of Echocardiogram examinations.                Cancer.  Sonographer:   VJannett CelestineRDCS (AE) Referring      3[place]:  Sonographer Comments: Image acquisition challenging due to patient body habitus. IMPRESSIONS  1. Left ventricular ejection fraction, by estimation, is 65 to 70%. The left ventricle has normal function. The left ventricle has no regional wall motion abnormalities. Left ventricular diastolic function could not be evaluated.  2. Right ventricular systolic function is normal. The right ventricular size is normal.  3. The mitral valve is normal in structure. Trivial  mitral valve regurgitation. No evidence of mitral stenosis.  4. The aortic valve is normal in structure. Aortic valve regurgitation is not visualized. No aortic stenosis is present.  5. The inferior vena cava is normal in size with greater than 50% respiratory variability, suggesting right atrial pressure of 3 mmHg. FINDINGS  Left Ventricle: Left ventricular ejection fraction, by estimation, is 65 to 70%. The left ventricle has normal function. The left ventricle has no regional wall motion abnormalities. The left ventricular internal cavity size was normal in size. There is  no left ventricular hypertrophy. Left ventricular diastolic function could not be evaluated. Right Ventricle: The right ventricular size is normal. No increase in right ventricular wall thickness. Right ventricular systolic function is normal. Left Atrium: Left atrial size was normal in size. Right Atrium: Right atrial size was normal in size. Pericardium: There is no evidence of pericardial  effusion. Mitral Valve: The mitral valve is normal in structure. Trivial mitral valve regurgitation. No evidence of mitral valve stenosis. Tricuspid Valve: The tricuspid valve is normal in structure. Tricuspid valve regurgitation is trivial. No evidence of tricuspid stenosis. Aortic Valve: The aortic valve is normal in structure. Aortic valve regurgitation is not visualized. No aortic stenosis is present. Pulmonic Valve: The pulmonic valve was normal in structure. Pulmonic valve regurgitation is trivial. No evidence of pulmonic stenosis. Aorta: The aortic root is normal in size and structure. Venous: The inferior vena cava is normal in size with greater than 50% respiratory variability, suggesting right atrial pressure of 3 mmHg. IAS/Shunts: There is right bowing of the interatrial septum, suggestive of elevated left atrial pressure. No atrial level shunt detected by color flow Doppler. Candee Furbish MD Electronically signed by Candee Furbish MD Signature Date/Time: 04/21/2020/11:53:58 AM    Final    IR IMAGING GUIDED PORT INSERTION  Result Date: 05/08/2020 INDICATION: 64 year old male with diffuse large B-cell lymphoma. He requires port catheter placement for durable IV access. EXAM: IMPLANTED PORT A CATH PLACEMENT WITH ULTRASOUND AND FLUOROSCOPIC GUIDANCE MEDICATIONS: 2 g Ancef; The antibiotic was administered within an appropriate time interval prior to skin puncture. ANESTHESIA/SEDATION: Versed 4 mg IV; Fentanyl 100 mcg IV; Moderate Sedation Time:  26 minutes The patient was continuously monitored during the procedure by the interventional radiology nurse under my direct supervision. FLUOROSCOPY TIME:  1 minutes, 12 seconds (9 mGy) COMPLICATIONS: None immediate. PROCEDURE: The right neck and chest was prepped with chlorhexidine, and draped in the usual sterile fashion using maximum barrier technique (cap and mask, sterile gown, sterile gloves, large sterile sheet, hand hygiene and cutaneous antiseptic). Local  anesthesia was attained by infiltration with 1% lidocaine with epinephrine. Ultrasound demonstrated patency of the right internal jugular vein, and this was documented with an image. Under real-time ultrasound guidance, this vein was accessed with a 21 gauge micropuncture needle and image documentation was performed. A small dermatotomy was made at the access site with an 11 scalpel. A 0.018" wire was advanced into the SVC and the access needle exchanged for a 48F micropuncture vascular sheath. The 0.018" wire was then removed and a 0.035" wire advanced into the IVC. An appropriate location for the subcutaneous reservoir was selected below the clavicle and an incision was made through the skin and underlying soft tissues. The subcutaneous tissues were then dissected using a combination of blunt and sharp surgical technique and a pocket was formed. A single lumen power injectable portacatheter was then tunneled through the subcutaneous tissues from the pocket to the dermatotomy and the port reservoir  placed within the subcutaneous pocket. The venous access site was then serially dilated and a peel away vascular sheath placed over the wire. The wire was removed and the port catheter advanced into position under fluoroscopic guidance. The catheter tip is positioned in the superior cavoatrial junction. This was documented with a spot image. The portacatheter was then tested and found to flush and aspirate well. The port was flushed with saline followed by 100 units/mL heparinized saline. The pocket was then closed in two layers using first subdermal inverted interrupted absorbable sutures followed by a running subcuticular suture. The epidermis was then sealed with Dermabond. The dermatotomy at the venous access site was also closed with Dermabond. IMPRESSION: Successful placement of a right IJ approach Power Port with ultrasound and fluoroscopic guidance. The catheter is ready for use. Electronically Signed   By: Jacqulynn Cadet M.D.   On: 05/08/2020 15:34   IR PICC PLACEMENT RIGHT >5 YRS INC IMG GUIDE  Result Date: 04/21/2020 INDICATION: History of lymphoma. In need of durable intravenous access for the initiation chemotherapy. EXAM: ULTRASOUND AND FLUOROSCOPIC GUIDED PICC LINE INSERTION MEDICATIONS: None. CONTRAST:  Cc Omnipaque 300 FLUOROSCOPY TIME:  18 seconds COMPLICATIONS: None immediate. TECHNIQUE: The procedure, risks, benefits, and alternatives were explained to the patient and informed written consent was obtained. A timeout was performed prior to the initiation of the procedure. The right upper extremity was prepped with chlorhexidine in a sterile fashion, and a sterile drape was applied covering the operative field. Maximum barrier sterile technique with sterile gowns and gloves were used for the procedure. A timeout was performed prior to the initiation of the procedure. Local anesthesia was provided with 1% lidocaine. Under direct ultrasound guidance, the brachial vein was accessed with a micropuncture kit after the overlying soft tissues were anesthetized with 1% lidocaine. After the overlying soft tissues were anesthetized, a small venotomy incision was created and a micropuncture kit was utilized to access the right brachial vein. Real-time ultrasound guidance was utilized for vascular access including the acquisition of a permanent ultrasound image documenting patency of the accessed vessel. A guidewire was advanced to the level of the level of the axilla and a peel-away sheath was placed. As there was difficulty advancing the microwire centrally, a limited venogram was performed via the micropuncture sheath demonstrating patency of the right upper extremity central venous system. Ultimately, a microwire was advanced to the level of the superior cavoatrial junction and utilized for measurement purposes. Next, a 38 cm, 5 [place], dual lumen was inserted to level of the superior caval-atrial junction. A post  procedure spot fluoroscopic was obtained. The catheter easily aspirated and flushed and was secured in place with stat lock device. A dressing was applied. The patient tolerated the procedure well without immediate post procedural complication. FINDINGS: After catheter placement, the tip lies within the superior cavoatrial junction. The catheter aspirates and flushes normally and is ready for immediate use. IMPRESSION: Successful ultrasound and fluoroscopic guided placement of a right brachial vein approach, cm, 5 French, dual lumen PICC with tip at the superior caval-atrial junction. The PICC line is ready for immediate use. Electronically Signed   By: Sandi Mariscal M.D.   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 Intensely hypermetabolic bilateral neck, bilateral axillary, bilateral mediastinal, bilateral hilar and retroperitoneal lymphadenopathy. Numerous small hypermetabolic splenic masses. Findings are compatible with  malignancy, favoring lymphoproliferative disorder." PLAN -Tolerating cycle 2 of his chemotherapy well overall with the exception of mild nausea. -CBC from this morning has been reviewed.  He has leukocytosis due to prednisone and recent Neulasta.  He has mild anemia which is overall stable.  Labs are adequate to continue with chemotherapy. -Continue to check daily CBC with differential, CMET, LDH, uric acid. -Continue allopurinol 100 mg twice a day. -Continue as needed antiemetics and bowel regimen. -Continue home pain medication. -Continue Lovenox for DVT prophylaxis. -The patient will have rituximab and G-CSF as an outpatient on 05/19/2020.  He will have a lab and follow-up visit for toxicity check on 05/26/2020.   LOS: 3 days   Mikey Bussing, DNP, AGPCNP-BC, AOCNP 05/15/20   ADDENDUM  .Patient was Personally and independently interviewed,  examined and relevant elements of the history of present illness were reviewed in details and an assessment and plan was created. All elements of the patient's history of present illness , assessment and plan were discussed in details with Mikey Bussing, DNP, AGPCNP-BC, AOCNP. The above documentation reflects our combined findings assessment and plan.  Sullivan Lone MD MS

## 2020-05-16 ENCOUNTER — Other Ambulatory Visit: Payer: Self-pay | Admitting: Oncology

## 2020-05-16 ENCOUNTER — Other Ambulatory Visit: Payer: Self-pay | Admitting: Hematology

## 2020-05-16 DIAGNOSIS — C8598 Non-Hodgkin lymphoma, unspecified, lymph nodes of multiple sites: Secondary | ICD-10-CM

## 2020-05-16 DIAGNOSIS — C8338 Diffuse large B-cell lymphoma, lymph nodes of multiple sites: Secondary | ICD-10-CM

## 2020-05-16 DIAGNOSIS — R110 Nausea: Secondary | ICD-10-CM

## 2020-05-16 LAB — COMPREHENSIVE METABOLIC PANEL
ALT: 58 U/L — ABNORMAL HIGH (ref 0–44)
AST: 41 U/L (ref 15–41)
Albumin: 2.8 g/dL — ABNORMAL LOW (ref 3.5–5.0)
Alkaline Phosphatase: 86 U/L (ref 38–126)
Anion gap: 8 (ref 5–15)
BUN: 15 mg/dL (ref 8–23)
CO2: 23 mmol/L (ref 22–32)
Calcium: 8.3 mg/dL — ABNORMAL LOW (ref 8.9–10.3)
Chloride: 104 mmol/L (ref 98–111)
Creatinine, Ser: 0.6 mg/dL — ABNORMAL LOW (ref 0.61–1.24)
GFR, Estimated: >60 mL/min (ref >60)
Glucose, Bld: 99 mg/dL (ref 70–99)
Potassium: 3.2 mmol/L — ABNORMAL LOW (ref 3.5–5.1)
Sodium: 135 mmol/L (ref 135–145)
Total Bilirubin: 0.5 mg/dL (ref 0.3–1.2)
Total Protein: 5.3 g/dL — ABNORMAL LOW (ref 6.5–8.1)

## 2020-05-16 LAB — CBC WITH DIFFERENTIAL/PLATELET
Abs Immature Granulocytes: 0.2 10*3/uL — ABNORMAL HIGH (ref 0.00–0.07)
Basophils Absolute: 0 10*3/uL (ref 0.0–0.1)
Basophils Relative: 0 %
Eosinophils Absolute: 0 10*3/uL (ref 0.0–0.5)
Eosinophils Relative: 0 %
HCT: 30.3 % — ABNORMAL LOW (ref 39.0–52.0)
Hemoglobin: 10.9 g/dL — ABNORMAL LOW (ref 13.0–17.0)
Immature Granulocytes: 2 %
Lymphocytes Relative: 11 %
Lymphs Abs: 1 10*3/uL (ref 0.7–4.0)
MCH: 30.5 pg (ref 26.0–34.0)
MCHC: 36 g/dL (ref 30.0–36.0)
MCV: 84.9 fL (ref 80.0–100.0)
Monocytes Absolute: 0.3 10*3/uL (ref 0.1–1.0)
Monocytes Relative: 3 %
Neutro Abs: 7.7 10*3/uL (ref 1.7–7.7)
Neutrophils Relative %: 84 %
Platelets: 373 10*3/uL (ref 150–400)
RBC: 3.57 MIL/uL — ABNORMAL LOW (ref 4.22–5.81)
RDW: 12.1 % (ref 11.5–15.5)
WBC: 9.2 10*3/uL (ref 4.0–10.5)
nRBC: 0 % (ref 0.0–0.2)

## 2020-05-16 LAB — LACTATE DEHYDROGENASE: LDH: 120 U/L (ref 98–192)

## 2020-05-16 LAB — URIC ACID: Uric Acid, Serum: 4.5 mg/dL (ref 3.7–8.6)

## 2020-05-16 MED ORDER — POTASSIUM CHLORIDE 20 MEQ PO PACK: 20.0000 meq | PACK | Freq: Every day | ORAL | Status: DC

## 2020-05-16 MED ORDER — SODIUM BICARBONATE/SODIUM CHLORIDE MOUTHWASH: 1.0000 "application " | Freq: Four times a day (QID) | OROMUCOSAL | Status: AC

## 2020-05-16 MED ORDER — ACETAMINOPHEN-CODEINE #3 300-30 MG PO TABS: 1.0000 | ORAL_TABLET | ORAL | 0 refills | Status: DC | PRN

## 2020-05-16 MED ORDER — HEPARIN SOD (PORK) LOCK FLUSH 100 UNIT/ML IV SOLN: 500.0000 [IU] | INTRAVENOUS | Status: DC | PRN

## 2020-05-16 MED ORDER — POTASSIUM CHLORIDE CRYS ER 20 MEQ PO TBCR: 20.0000 meq | EXTENDED_RELEASE_TABLET | Freq: Every day | ORAL | 0 refills | Status: DC

## 2020-05-16 MED ORDER — HEPARIN SOD (PORK) LOCK FLUSH 100 UNIT/ML IV SOLN
500.0000 [IU] | INTRAVENOUS | Status: DC
  Administered 2020-05-16: 500 [IU]
  Filled 2020-05-16: qty 5

## 2020-05-16 MED ORDER — POTASSIUM CHLORIDE CRYS ER 20 MEQ PO TBCR
20.0000 meq | EXTENDED_RELEASE_TABLET | Freq: Every day | ORAL | Status: DC
  Administered 2020-05-16: 20 meq via ORAL
  Filled 2020-05-16: qty 1

## 2020-05-16 NOTE — Discharge Summary (Addendum)
Discharge Summary  Patient ID: Torion Hulgan MRN: [id number] DOB/AGE: [birth year] 64 y.o.  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 [facility])   Discharged Condition: good  Discharge Labs:   CBC    Component Value Date/Time   WBC 9.2 05/16/2020 0830   RBC 3.57 (L) 05/16/2020 0830   HGB 10.9 (L) 05/16/2020 0830   HCT 30.3 (L) 05/16/2020 0830   PLT 373 05/16/2020 0830   MCV 84.9 05/16/2020 0830   MCH 30.5 05/16/2020 0830   MCHC 36.0 05/16/2020 0830   RDW 12.1 05/16/2020 0830   LYMPHSABS 1.0 05/16/2020 0830   MONOABS 0.3 05/16/2020 0830   EOSABS 0.0 05/16/2020 0830   BASOSABS 0.0 05/16/2020 0830   CMP Latest Ref Rng & Units 05/16/2020 05/15/2020 05/14/2020  Glucose 70 - 99 mg/dL 99 88 109(H)  BUN 8 - 23 mg/dL 15 17 17   Creatinine 0.61 - 1.24 mg/dL 0.60(L) 0.69 0.64  Sodium 135 - 145 mmol/L 135 137 139  Potassium 3.5 - 5.1 mmol/L 3.2(L) 3.6 3.8  Chloride 98 - 111 mmol/L 104 106 108  CO2 22 - 32 mmol/L 23 21(L) 21(L)  Calcium 8.9 - 10.3 mg/dL 8.3(L) 8.4(L) 8.9  Total Protein 6.5 - 8.1 g/dL 5.3(L) 5.5(L) 6.1(L)  Total Bilirubin 0.3 - 1.2 mg/dL 0.5 0.5 0.5  Alkaline Phos 38 - 126 U/L 86 86 101  AST 15 - 41 U/L 41 32 27  ALT 0 - 44 U/L 58(H) 45(H) 42    Significant Diagnostic Studies: None  Consults: None  Procedures: None  Disposition:  Discharge disposition: 01-Home or Self Care      Allergies as of 05/16/2020   No Known Allergies     Medication List    TAKE these medications   acetaminophen-codeine 300-30 MG tablet Commonly known as: TYLENOL #3 Take 1 tablet by mouth every 4 (four) hours as needed for moderate pain.   allopurinol 100 MG tablet Commonly known as: ZYLOPRIM Take 1 tablet (100 mg total) by mouth 2 (two) times daily.    dexamethasone 4 MG tablet Commonly known as: DECADRON Take 1 tablet twice a day for 3 days starting the day after each cycle of chemo What changed:   how much to take  how to take this  when to take this   dronabinol 2.5 MG capsule Commonly known as: MARINOL TAKE 1 CAPSULE BY MOUTH TWICE A DAY BEFORE LUNCH AND SUPPER What changed: See the new instructions.   LORazepam 0.5 MG tablet Commonly known as: ATIVAN TAKE 1 TABLET EVERY 6 (SIX) HOURS AS NEEDED (NAUSEA/VOMITING (IF PROCHLORPERAZINE NOT EFFECTIVE)). What changed: See the new instructions.   ondansetron 8 MG tablet Commonly known as: ZOFRAN TAKE 1 TABLET BY MOUTH EVERY 8 HOURS AS NEEDED FOR NAUSEA OR VOMITING. What changed: See the new instructions.   polyethylene glycol 17 g packet Commonly known as: MIRALAX / GLYCOLAX Take 17 g by mouth daily as needed. What changed: reasons to take this   potassium chloride SA 20 MEQ tablet Commonly known as: KLOR-CON Take 1 tablet (20 mEq total) by mouth daily.   prochlorperazine 10 MG tablet Commonly known as: COMPAZINE TAKE 1 TABLET BY MOUTH EVERY 6 HOURS AS NEEDED FOR NAUSEA OR VOMITING. What changed: See the new instructions.   senna-docusate 8.6-50 MG tablet Commonly  known as: Senokot-S Take 1 tablet by mouth 2 (two) times daily as needed for mild constipation.   sodium bicarbonate/sodium chloride Soln 1 application by Mouth Rinse route 4 (four) times daily.   THC FREE PO Take 1 tablet by mouth daily as needed (nausea/vomitting).   Ziextenzo 6 MG/0.6ML injection Generic drug: pegfilgrastim-bmez Inject 0.6 mLs (6 mg total) into the skin See admin instructions. Inject 0.6 mLs (6 mg total) into the skin once for 1 dose. 24 hours after each cycle of EPOCH. To be administered by Home Health RN arranged by [facility]ject 6 mg into the skin See admin instructions. What changed: additional instructions       HPI:  Bryan [name] is a wonderful 64 y.o. male who has  been referred to [place] by Dr. Tammi Klippel for evaluation and management of lung mass.  About 6 months ago pt began having abdominal pain after eating. He saw several physicians who could not explain his symptoms.  He then developed extreme discomfort in his chest and red stools. He is unsure if the color of his stools was from blood or diet. Pt then began experiencing left arm pain and pleuritic chest pain, which triggered his Chest CTA in October.  CTA chest ruled out PE but showed bulky mediastinal and bilateral hilar adenopathy.  He was referred to hematology oncology at [facility] and a PET scan and biopsy were ordered.  There were significant delays in getting these tests completed.  Mr. Pressnell then establish care here in [place], [place] with hopes of expediting his work-up.  He underwent a PET scan on 04/08/2020 which showed intensely hypermetabolic bilateral neck, bilateral axillary, bilateral mediastinal, bilateral hilar, and retroperitoneal lymphadenopathy, numerous small hypermetabolic splenic masses.  He then had an ultrasound-guided biopsy of a left supraclavicular lymph node which showed diffuse large B-cell lymphoma.  MRI of the brain with and without contrast was performed on 04/18/2020 which showed no acute intracranial process and no evidence for metastatic disease.  It was recommended for the patient to begin systemic chemotherapy with EPOCH-R.   The patient was seen on the day of admission prior to cycle #2 of his chemotherapy.  On admission, he reported generalized fatigue and ongoing arthralgias.  Using Tylenol 3 for pain.  He had a headache the morning of admission which had resolved by the time of admission.  He was not having any fevers, chest pain, shortness of breath.  He was not having any abdominal pain, nausea, vomiting.  He is bowels were moving well.  The patient was admitted for cycle #2 of his chemotherapy.  Hospital Course: Mr. Jay started his chemotherapy as planned on the  day of admission.  He overall tolerated his chemotherapy well with the exception of grade 1 nausea.  No vomiting was reported.  Nausea was overall controlled with as needed antiemetics.  He did not have any mucositis.  Constipation was well controlled with scheduled MiraLAX and Senokot-S.  On the day of discharge, nausea continues to be overall controlled.  No vomiting or mucositis today.  Pain continues to be controlled with Tylenol No. 3.  Constipation controlled with current bowel regimen.  He has no fevers, chills, chest pain, shortness of breath.  He was noted to have mild hypokalemia on lab work.  He was given a dose of potassium chloride prior to discharge and a prescription for potassium chloride 20 mEq daily x1 week was sent to his pharmacy.  Other labs were reviewed and his WBC had normalized and  his was hemoglobin stable.  The patient will be discharged home in good condition.  In addition to the potassium prescription, refills for lorazepam, Zofran, and Tylenol 3 were sent to his pharmacy.  PDMP database reviewed.  Physical Exam Constitutional:      General: He is not in acute distress.    Appearance: Normal appearance.  HENT:     Head: Normocephalic and atraumatic.     Nose: Nose normal.  Eyes:     Extraocular Movements: Extraocular movements intact.     Conjunctiva/sclera: Conjunctivae normal.     Pupils: Pupils are equal, round, and reactive to light.  Cardiovascular:     Rate and Rhythm: Normal rate and regular rhythm.     Pulses: Normal pulses.     Heart sounds: Normal heart sounds.  Pulmonary:     Effort: Pulmonary effort is normal.     Breath sounds: Normal breath sounds.  Abdominal:     General: Abdomen is flat. Bowel sounds are normal.     Palpations: Abdomen is soft.  Musculoskeletal:        General: Normal range of motion.  Skin:    General: Skin is warm and dry.  Neurological:     General: No focal deficit present.     Mental Status: He is alert and oriented to  person, place, and time. Mental status is at baseline.  Psychiatric:        Mood and Affect: Mood normal.        Behavior: Behavior normal.        Thought Content: Thought content normal.        Judgment: Judgment normal.     Discharge Instructions    Activity as tolerated - No restrictions   Complete by: As directed    Diet general   Complete by: As directed     The patient will have rituximab and G-CSF as an outpatient on 05/19/2020.  He will have a lab and follow-up visit for toxicity check on 05/26/2020.  Signed: Mikey Bussing 05/16/2020, 9:38 AM     ADDENDUM  Patient was Personally and independently interviewed, examined and relevant elements of the discharge plan were reviewed in details. All elements of the patient's discharge plan were discussed in details with Mikey Bussing DNP. The above documentation reflects our combined findings assessment and plan.  Sullivan Lone MD MS TT spent discharging patient>30 mins

## 2020-05-16 NOTE — Discharge Instructions (Signed)
Take dexamethasone 4 mg twice a day on Saturday, Sunday, and Monday. Then stop.  Take potassium tablet daily for 7 days.

## 2020-05-16 NOTE — Progress Notes (Signed)
Patient discharged home. Patient and spouse educated on discharge instructions. Patient understood and clarification of which medications to start and stop were verified with physician. Port access was discontinued, bandage placed on insertion site, needle removed. Patient tolerated well. Patient left via ambulation, accompanied by nursing staff and spouse into private vehicle.

## 2020-05-16 NOTE — Telephone Encounter (Signed)
Dr. Irene Limbo, You may want to refuse as Mikey Bussing recently refilled this. Gardiner Rhyme, RN

## 2020-05-19 ENCOUNTER — Other Ambulatory Visit: Payer: Self-pay | Admitting: *Deleted

## 2020-05-19 ENCOUNTER — Other Ambulatory Visit: Payer: Self-pay

## 2020-05-19 ENCOUNTER — Other Ambulatory Visit: Payer: Self-pay | Admitting: Hematology

## 2020-05-19 ENCOUNTER — Inpatient Hospital Stay: Payer: 59 | Attending: Hematology

## 2020-05-19 VITALS — BP 106/70 | HR 60 | Temp 97.7°F | Resp 18 | Wt 132.2 lb

## 2020-05-19 DIAGNOSIS — C8598 Non-Hodgkin lymphoma, unspecified, lymph nodes of multiple sites: Secondary | ICD-10-CM | POA: Insufficient documentation

## 2020-05-19 DIAGNOSIS — I700 Atherosclerosis of aorta: Secondary | ICD-10-CM | POA: Insufficient documentation

## 2020-05-19 DIAGNOSIS — Z5112 Encounter for antineoplastic immunotherapy: Secondary | ICD-10-CM | POA: Diagnosis not present

## 2020-05-19 DIAGNOSIS — Z8546 Personal history of malignant neoplasm of prostate: Secondary | ICD-10-CM | POA: Insufficient documentation

## 2020-05-19 DIAGNOSIS — Z5111 Encounter for antineoplastic chemotherapy: Secondary | ICD-10-CM

## 2020-05-19 DIAGNOSIS — Z7189 Other specified counseling: Secondary | ICD-10-CM

## 2020-05-19 DIAGNOSIS — C8338 Diffuse large B-cell lymphoma, lymph nodes of multiple sites: Secondary | ICD-10-CM

## 2020-05-19 MED ORDER — ACETAMINOPHEN 325 MG PO TABS
650.0000 mg | ORAL_TABLET | Freq: Once | ORAL | Status: AC
  Administered 2020-05-19: 650 mg via ORAL

## 2020-05-19 MED ORDER — DIPHENHYDRAMINE HCL 25 MG PO CAPS
50.0000 mg | ORAL_CAPSULE | Freq: Once | ORAL | Status: AC
  Administered 2020-05-19: 50 mg via ORAL

## 2020-05-19 MED ORDER — SODIUM CHLORIDE 0.9% FLUSH
10.0000 mL | INTRAVENOUS | Status: DC | PRN
  Administered 2020-05-19: 15:00:00 10 mL
  Filled 2020-05-19: qty 10

## 2020-05-19 MED ORDER — MONTELUKAST SODIUM 10 MG PO TABS
ORAL_TABLET | ORAL | Status: AC
  Filled 2020-05-19: qty 1

## 2020-05-19 MED ORDER — METHYLPREDNISOLONE SODIUM SUCC 125 MG IJ SOLR
INTRAMUSCULAR | Status: AC
  Filled 2020-05-19: qty 2

## 2020-05-19 MED ORDER — HEPARIN SOD (PORK) LOCK FLUSH 100 UNIT/ML IV SOLN
500.0000 [IU] | Freq: Once | INTRAVENOUS | Status: AC | PRN
  Administered 2020-05-19: 15:00:00 500 [IU]
  Filled 2020-05-19: qty 5

## 2020-05-19 MED ORDER — ACETAMINOPHEN 325 MG PO TABS
ORAL_TABLET | ORAL | Status: AC
  Filled 2020-05-19: qty 2

## 2020-05-19 MED ORDER — MONTELUKAST SODIUM 10 MG PO TABS
10.0000 mg | ORAL_TABLET | Freq: Every day | ORAL | Status: DC
  Administered 2020-05-19: 12:00:00 10 mg via ORAL

## 2020-05-19 MED ORDER — SODIUM CHLORIDE 0.9 % IV SOLN
Freq: Once | INTRAVENOUS | Status: AC
  Filled 2020-05-19: qty 250

## 2020-05-19 MED ORDER — DIPHENHYDRAMINE HCL 25 MG PO CAPS
ORAL_CAPSULE | ORAL | Status: AC
  Filled 2020-05-19: qty 2

## 2020-05-19 MED ORDER — SODIUM CHLORIDE 0.9 % IV SOLN
375.0000 mg/m2 | Freq: Once | INTRAVENOUS | Status: AC
  Administered 2020-05-19: 13:00:00 600 mg via INTRAVENOUS
  Filled 2020-05-19: qty 50

## 2020-05-19 MED ORDER — FAMOTIDINE IN NACL 20-0.9 MG/50ML-% IV SOLN
20.0000 mg | Freq: Once | INTRAVENOUS | Status: AC
  Administered 2020-05-19: 12:00:00 20 mg via INTRAVENOUS

## 2020-05-19 MED ORDER — LIDOCAINE-PRILOCAINE 2.5-2.5 % EX CREA: TOPICAL_CREAM | CUTANEOUS | Status: DC | PRN

## 2020-05-19 MED ORDER — METHYLPREDNISOLONE SODIUM SUCC 125 MG IJ SOLR
80.0000 mg | Freq: Every day | INTRAMUSCULAR | Status: DC
  Administered 2020-05-19: 12:00:00 80 mg via INTRAVENOUS

## 2020-05-19 MED ORDER — FAMOTIDINE IN NACL 20-0.9 MG/50ML-% IV SOLN
INTRAVENOUS | Status: AC
  Filled 2020-05-19: qty 50

## 2020-05-19 NOTE — Patient Instructions (Signed)
[facility] Discharge Instructions for Patients Receiving Chemotherapy  Today you received the following chemotherapy agents: rituximab.  To help prevent nausea and vomiting after your treatment, we encourage you to take your nausea medication as directed.   If you develop nausea and vomiting that is not controlled by your nausea medication, call the clinic.   BELOW ARE SYMPTOMS THAT SHOULD BE REPORTED IMMEDIATELY:  *FEVER GREATER THAN 100.5 F  *CHILLS WITH OR WITHOUT FEVER  NAUSEA AND VOMITING THAT IS NOT CONTROLLED WITH YOUR NAUSEA MEDICATION  *UNUSUAL SHORTNESS OF BREATH  *UNUSUAL BRUISING OR BLEEDING  TENDERNESS IN MOUTH AND THROAT WITH OR WITHOUT PRESENCE OF ULCERS  *URINARY PROBLEMS  *BOWEL PROBLEMS  UNUSUAL RASH Items with * indicate a potential emergency and should be followed up as soon as possible.  Feel free to call the clinic should you have any questions or concerns. The clinic phone number is [phone].  Please show the CHEMO ALERT CARD at check-in to the Emergency Department and triage nurse.   

## 2020-05-22 ENCOUNTER — Telehealth: Payer: Self-pay | Admitting: *Deleted

## 2020-05-22 DIAGNOSIS — Z5111 Encounter for antineoplastic chemotherapy: Secondary | ICD-10-CM

## 2020-05-22 DIAGNOSIS — C8338 Diffuse large B-cell lymphoma, lymph nodes of multiple sites: Secondary | ICD-10-CM

## 2020-05-22 MED ORDER — LIDOCAINE-PRILOCAINE 2.5-2.5 % EX CREA: 1.0000 "application " | TOPICAL_CREAM | CUTANEOUS | 0 refills | Status: DC | PRN

## 2020-05-22 NOTE — Telephone Encounter (Signed)
Scheduled for Monday 06/02/20 AM admission to inpatient oncology unitper [facility] in patient placement. Bryan [name]  AD requests patient call  Unit secretary [phone] at 7am to see if bed is ready.  Covid screen is scheduled to be done once patient reaches oncology unit per Ms. Bobbye Charleston.  Email sent to #[email or web address] with all information Patient's wifecontacted and informed of all times. She verbalized understanding

## 2020-05-23 ENCOUNTER — Other Ambulatory Visit: Payer: Self-pay | Admitting: *Deleted

## 2020-05-23 DIAGNOSIS — C8338 Diffuse large B-cell lymphoma, lymph nodes of multiple sites: Secondary | ICD-10-CM

## 2020-05-25 ENCOUNTER — Other Ambulatory Visit: Payer: Self-pay | Admitting: Oncology

## 2020-05-25 DIAGNOSIS — C8598 Non-Hodgkin lymphoma, unspecified, lymph nodes of multiple sites: Secondary | ICD-10-CM

## 2020-05-26 ENCOUNTER — Inpatient Hospital Stay (HOSPITAL_BASED_OUTPATIENT_CLINIC_OR_DEPARTMENT_OTHER): Payer: 59 | Admitting: Hematology

## 2020-05-26 ENCOUNTER — Inpatient Hospital Stay: Payer: 59

## 2020-05-26 ENCOUNTER — Other Ambulatory Visit: Payer: Self-pay

## 2020-05-26 VITALS — BP 100/55 | HR 60 | Temp 96.7°F | Resp 17 | Ht 67.0 in | Wt 139.1 lb

## 2020-05-26 DIAGNOSIS — Z95828 Presence of other vascular implants and grafts: Secondary | ICD-10-CM

## 2020-05-26 DIAGNOSIS — C8338 Diffuse large B-cell lymphoma, lymph nodes of multiple sites: Secondary | ICD-10-CM

## 2020-05-26 DIAGNOSIS — R110 Nausea: Secondary | ICD-10-CM | POA: Diagnosis not present

## 2020-05-26 DIAGNOSIS — Z5111 Encounter for antineoplastic chemotherapy: Secondary | ICD-10-CM | POA: Diagnosis not present

## 2020-05-26 DIAGNOSIS — Z5112 Encounter for antineoplastic immunotherapy: Secondary | ICD-10-CM | POA: Diagnosis not present

## 2020-05-26 LAB — CBC WITH DIFFERENTIAL (CANCER CENTER ONLY)
Abs Immature Granulocytes: 16.49 10*3/uL — ABNORMAL HIGH (ref 0.00–0.07)
Basophils Absolute: 0.2 10*3/uL — ABNORMAL HIGH (ref 0.0–0.1)
Basophils Relative: 0 %
Eosinophils Absolute: 0.7 10*3/uL — ABNORMAL HIGH (ref 0.0–0.5)
Eosinophils Relative: 1 %
HCT: 29.9 % — ABNORMAL LOW (ref 39.0–52.0)
Hemoglobin: 10 g/dL — ABNORMAL LOW (ref 13.0–17.0)
Immature Granulocytes: 33 %
Lymphocytes Relative: 3 %
Lymphs Abs: 1.6 10*3/uL (ref 0.7–4.0)
MCH: 29.9 pg (ref 26.0–34.0)
MCHC: 33.4 g/dL (ref 30.0–36.0)
MCV: 89.3 fL (ref 80.0–100.0)
Monocytes Absolute: 3.5 10*3/uL — ABNORMAL HIGH (ref 0.1–1.0)
Monocytes Relative: 7 %
Neutro Abs: 28 10*3/uL — ABNORMAL HIGH (ref 1.7–7.7)
Neutrophils Relative %: 56 %
Platelet Count: 204 10*3/uL (ref 150–400)
RBC: 3.35 MIL/uL — ABNORMAL LOW (ref 4.22–5.81)
RDW: 13.6 % (ref 11.5–15.5)
WBC Count: 50.6 10*3/uL (ref 4.0–10.5)
nRBC: 0.7 % — ABNORMAL HIGH (ref 0.0–0.2)

## 2020-05-26 LAB — CMP (CANCER CENTER ONLY)
ALT: 72 U/L — ABNORMAL HIGH (ref 0–44)
AST: 34 U/L (ref 15–41)
Albumin: 3.3 g/dL — ABNORMAL LOW (ref 3.5–5.0)
Alkaline Phosphatase: 112 U/L (ref 38–126)
Anion gap: 10 (ref 5–15)
BUN: 8 mg/dL (ref 8–23)
CO2: 26 mmol/L (ref 22–32)
Calcium: 8.6 mg/dL — ABNORMAL LOW (ref 8.9–10.3)
Chloride: 106 mmol/L (ref 98–111)
Creatinine: 0.79 mg/dL (ref 0.61–1.24)
GFR, Estimated: >60 mL/min (ref >60)
Glucose, Bld: 103 mg/dL — ABNORMAL HIGH (ref 70–99)
Potassium: 4 mmol/L (ref 3.5–5.1)
Sodium: 142 mmol/L (ref 135–145)
Total Bilirubin: <0.2 mg/dL — ABNORMAL LOW (ref 0.3–1.2)
Total Protein: 5.9 g/dL — ABNORMAL LOW (ref 6.5–8.1)

## 2020-05-26 LAB — LACTATE DEHYDROGENASE: LDH: 434 U/L — ABNORMAL HIGH (ref 98–192)

## 2020-05-26 LAB — URIC ACID: Uric Acid, Serum: 7.1 mg/dL (ref 3.7–8.6)

## 2020-05-26 MED ORDER — SODIUM CHLORIDE 0.9% FLUSH
10.0000 mL | INTRAVENOUS | Status: DC | PRN
  Administered 2020-05-26: 15:00:00 10 mL via INTRAVENOUS
  Filled 2020-05-26: qty 10

## 2020-05-26 MED ORDER — ACETAMINOPHEN-CODEINE #3 300-30 MG PO TABS: 1.0000 | ORAL_TABLET | ORAL | 0 refills | Status: DC | PRN

## 2020-05-26 MED ORDER — DRONABINOL 5 MG PO CAPS: 5.0000 mg | ORAL_CAPSULE | Freq: Two times a day (BID) | ORAL | 0 refills | Status: DC

## 2020-05-26 MED ORDER — DEXAMETHASONE 4 MG PO TABS: ORAL_TABLET | ORAL | 1 refills | Status: DC

## 2020-05-26 MED ORDER — HEPARIN SOD (PORK) LOCK FLUSH 100 UNIT/ML IV SOLN
500.0000 [IU] | Freq: Once | INTRAVENOUS | Status: AC
  Administered 2020-05-26: 15:00:00 500 [IU] via INTRAVENOUS
  Filled 2020-05-26: qty 5

## 2020-05-26 MED ORDER — ONDANSETRON HCL 8 MG PO TABS: ORAL_TABLET | ORAL | 0 refills | Status: DC

## 2020-05-26 NOTE — Progress Notes (Signed)
  HEMATOLOGY/ONCOLOGY CONSULTATION NOTE  Date of Service: 05/26/2020  Patient Care Team: System, Provider Not In as PCP - General  CHIEF COMPLAINTS/PURPOSE OF CONSULTATION:  mx of large B cell lymphoma  HISTORY OF PRESENTING ILLNESS:  Bryan [name] is a wonderful 64 y.o. male who has been referred to us by Dr. Manning for evaluation and management of lung mass. Pt is accompanied today by his wife. The pt reports that he is doing well overall.   The pt reports that he was diagnosed with low-grade Prostate Cancer in 2013. Pt had no symptoms, but got a check up after his father was diagnosed with Prostate Cancer. He was only treated with cryoablation. He also has a reducible, right inguinal hernia. Pt denies any other medical conditions or chronic medications. He does take a daily fish oil and multivitamin. He has NKDA.   About 6 months ago pt began having abdominal pain after eating. He saw several physicians who could not explain his symptoms. Last month he began having extreme discomfort in his chest and red stools. He is unsure if the color of his stools was from blood or diet. Pt then began experiencing left arm pain and pleuritic chest pain, which triggered his Chest CT in October.  In the last few nights pt has had roaming headaches that are improved with OTC Tylenol. Pt has lost 4-5 pounds recently, but attributes this to retiring and running more. Pt is currently taking two Oxycodone twice per day, which is controlling his discomfort. His wife also notes that the pt appears more fatigued than usual and is napping more. Pt felt poorly for 1 week after his second COVID19 vaccine, which he recived in March. Pt received the Pfizer vaccine, but waiting to receive the booster.   Of note prior to the patient's visit today, pt has had PET/CT ([id number]) completed on 04/08/2020 with results revealing "1. Intensely hypermetabolic bilateral neck, bilateral axillary, bilateral mediastinal,  bilateral hilar and retroperitoneal lymphadenopathy. Numerous small hypermetabolic splenic masses. Findings are compatible with malignancy, favoring lymphoproliferative disorder. The most accessible pathologic lymph nodes for potential percutaneous biopsy are likely within the left axilla and left supraclavicular neck."   Most recent lab results (03/31/2020) of CBC is as follows: all values are WNL except for HCT at 41.2, MPV at 9.7, Neutro Rel at 70.3, Immature Gran Rel at 0.4, Lymphs Rel at 15.8, Mono Rel at 10.3. 03/31/2020 D-Dimer at 0.51  On review of systems, pt reports headaches, chest pain, improved diarrhea, fatigue, mild productive cough, abdominal fullness and denies fevers, chills, night sweats, unexpected weight loss, low appetite, SOB, testicular pain/swelling, leg swelling, rash and any other symptoms.   On PMHx the pt reports Prostate Cancer, Cryotherapy, Inguinal hernia. 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 On Family Hx the pt reports a father with Prostate Cancer and a mother with Esophageal and Bladder Cancer (was a smoker).   INTERVAL HISTORY: Bryan [name] is a wonderful 64 y.o. male who is here for evaluation and management of newly diagnosed Large B-cell lymphoma. We are joined today by his wife. The patient's last visit with us was on 05/05/2020. The pt reports that he is doing well overall.  The pt reports that he has been feeling fatigued but is eating well and holding his weight. He has been using salt/baking soda and tea tree oil mouthrinses.   Lab results today (05/26/20) of CBC w/diff and CMP is as follows: all values are   WNL except for WBC at 50.6K, RBC at 3.35, Hgb at 10.0, HCT at 29.9, nRBC at 0.7, Glucose at 103, Calcium at 8.6, Total Protein at 5.9, Albumin at 3.3, ALT at 72, Total Bilirubin at <0.2. 05/26/2020 LDH at 434 05/26/2020 Uric acid at 7.1  On review of systems, pt reports fatigue, constipation, body aches and denies mouth  sores, unexpected weight loss, abdominal pain, nausea and any other symptoms.    MEDICAL HISTORY:  Past Medical History:  Diagnosis Date  . Prostate cancer [facility])     SURGICAL HISTORY: Past Surgical History:  Procedure Laterality Date  . CRYOTHERAPY    . IR IMAGING GUIDED PORT INSERTION  05/08/2020  . PROSTATE SURGERY      SOCIAL HISTORY: Social History   Socioeconomic History  . Marital status: Married    Spouse name: 1  . Number of children: Not on file  . Years of education: Not on file  . Highest education level: Not on file  Occupational History  . Occupation: Futures trader: [employer]    Comment: medical leave  Tobacco Use  . Smoking status: Never Smoker  . Smokeless tobacco: Never Used  Vaping Use  . Vaping Use: Never used  Substance and Sexual Activity  . Alcohol use: Not Currently  . Drug use: Never  . Sexual activity: Yes  Other Topics Concern  . Not on file  Social History Narrative  . Not on file   Social Determinants of Health   Financial Resource Strain: Not on file  Food Insecurity: Not on file  Transportation Needs: Not on file  Physical Activity: Not on file  Stress: Not on file  Social Connections: Not on file  Intimate Partner Violence: Not on file    FAMILY HISTORY: Family History  Problem Relation Age of Onset  . Bladder Cancer Mother   . Esophageal cancer Mother   . Prostate cancer Father   . Prostate cancer Paternal Uncle     ALLERGIES:  has No Known Allergies.  MEDICATIONS:  Current Outpatient Medications  Medication Sig Dispense Refill  . acetaminophen-codeine (TYLENOL #3) 300-30 MG tablet Take 1 tablet by mouth every 4 (four) hours as needed for moderate pain. 30 tablet 0  . Acetaminophen-Codeine 300-30 MG tablet TAKE 1 TABLET BY MOUTH EVERY 4 (FOUR) HOURS AS NEEDED FOR MODERATE PAIN 30 tablet 0  . allopurinol (ZYLOPRIM) 100 MG tablet Take 1 tablet (100 mg total) by mouth 2 (two) times daily. 60 tablet 1  .  Cannabinoids (THC FREE PO) Take 1 tablet by mouth daily as needed (nausea/vomitting).     [name] dexamethasone (DECADRON) 4 MG tablet Take 1 tablet twice a day for 3 days starting the day after each cycle of chemo 20 tablet 1  . dronabinol (MARINOL) 5 MG capsule Take 1 capsule (5 mg total) by mouth 2 (two) times daily before lunch and supper. 60 capsule 0  . lidocaine-prilocaine (EMLA) cream Apply 1 application topically as needed. Apply to port site 30 g 0  . LORazepam (ATIVAN) 0.5 MG tablet TAKE 1 TABLET EVERY 6 (SIX) HOURS AS NEEDED (NAUSEA/VOMITING (IF PROCHLORPERAZINE NOT EFFECTIVE)). 30 tablet 0  . ondansetron (ZOFRAN) 8 MG tablet TAKE 1 TABLET BY MOUTH EVERY 8 HOURS AS NEEDED FOR NAUSEA OR VOMITING. 30 tablet 0  . pegfilgrastim-bmez (ZIEXTENZO) 6 MG/0.6ML injection Inject 0.6 mLs (6 mg total) into the skin See admin instructions. Inject 0.6 mLs (6 mg total) into the skin once for 1 dose. 24  hours after each cycle of EPOCH. To be administered by Home Health RN arranged by [facility]oInject 6 mg into the skin See admin instructions. 0.6 mL 4  . polyethylene glycol (MIRALAX / GLYCOLAX) 17 g packet Take 17 g by mouth daily as needed. (Patient taking differently: Take 17 g by mouth daily as needed for mild constipation. ) 14 each 0  . potassium chloride SA (KLOR-CON) 20 MEQ tablet Take 1 tablet (20 mEq total) by mouth daily. 7 tablet 0  . prochlorperazine (COMPAZINE) 10 MG tablet TAKE 1 TABLET BY MOUTH EVERY 6 HOURS AS NEEDED FOR NAUSEA OR VOMITING. 30 tablet 0  . senna-docusate (SENOKOT-S) 8.6-50 MG tablet Take 1 tablet by mouth 2 (two) times daily as needed for mild constipation.    . Sodium Chloride-Sodium Bicarb (SODIUM BICARBONATE/SODIUM CHLORIDE) SOLN 1 application by Mouth Rinse route 4 (four) times daily.     No current facility-administered medications for this visit.    REVIEW OF SYSTEMS:   A 10+ POINT REVIEW OF SYSTEMS WAS OBTAINED including neurology, dermatology, psychiatry, cardiac,  respiratory, lymph, extremities, GI, GU, Musculoskeletal, constitutional, breasts, reproductive, HEENT.  All pertinent positives are noted in the HPI.  All others are negative.   PHYSICAL EXAMINATION: ECOG PERFORMANCE STATUS: 1 - Symptomatic but completely ambulatory  . Vitals:   05/26/20 1544  BP: (!) 100/55  Pulse: 60  Resp: 17  Temp: (!) 96.7 F (35.9 C)  SpO2: 99%   Filed Weights   05/26/20 1544  Weight: 139 lb 1.6 oz (63.1 kg)   .Body mass index is 21.79 kg/m.   GENERAL:alert, in no acute distress and comfortable SKIN: no acute rashes, no significant lesions EYES: conjunctiva are pink and non-injected, sclera anicteric OROPHARYNX: MMM, no exudates, no oropharyngeal erythema or ulceration NECK: supple, no JVD LYMPH:  no palpable lymphadenopathy in the cervical, axillary or inguinal regions LUNGS: clear to auscultation b/l with normal respiratory effort HEART: regular rate & rhythm ABDOMEN:  normoactive bowel sounds , non tender, not distended. No palpable hepatosplenomegaly.  Extremity: no pedal edema PSYCH: alert & oriented x 3 with fluent speech NEURO: no focal motor/sensory deficits  LABORATORY DATA:  I have reviewed the data as listed  . CBC Latest Ref Rng & Units 05/26/2020 05/16/2020 05/15/2020  WBC 4.0 - 10.5 K/uL 50.6(HH) 9.2 18.6(H)  Hemoglobin 13.0 - 17.0 g/dL 10.0(L) 10.9(L) 10.8(L)  Hematocrit 39.0 - 52.0 % 29.9(L) 30.3(L) 31.0(L)  Platelets 150 - 400 K/uL 204 373 353   . CMP Latest Ref Rng & Units 05/26/2020 05/16/2020 05/15/2020  Glucose 70 - 99 mg/dL 103(H) 99 88  BUN 8 - 23 mg/dL 8 15 17  Creatinine 0.61 - 1.24 mg/dL 0.79 0.60(L) 0.69  Sodium 135 - 145 mmol/L 142 135 137  Potassium 3.5 - 5.1 mmol/L 4.0 3.2(L) 3.6  Chloride 98 - 111 mmol/L 106 104 106  CO2 22 - 32 mmol/L 26 23 21(L)  Calcium 8.9 - 10.3 mg/dL 8.6(L) 8.3(L) 8.4(L)  Total Protein 6.5 - 8.1 g/dL 5.9(L) 5.3(L) 5.5(L)  Total Bilirubin 0.3 - 1.2 mg/dL <0.2(L) 0.5 0.5  Alkaline Phos  38 - 126 U/L 112 86 86  AST 15 - 41 U/L 34 41 32  ALT 0 - 44 U/L 72(H) 58(H) 45(H)   04/10/2020 FISH Analysis ([id number]):   04/10/2020 Left Cervical Lymph Node Bx ([id number]):    RADIOGRAPHIC STUDIES: I have personally reviewed the radiological images as listed and agreed with the findings in the report. IR IMAGING GUIDED PORT INSERTION    Result Date: 05/08/2020 INDICATION: 63-year-old male with diffuse large B-cell lymphoma. He requires port catheter placement for durable IV access. EXAM: IMPLANTED PORT A CATH PLACEMENT WITH ULTRASOUND AND FLUOROSCOPIC GUIDANCE MEDICATIONS: 2 g Ancef; The antibiotic was administered within an appropriate time interval prior to skin puncture. ANESTHESIA/SEDATION: Versed 4 mg IV; Fentanyl 100 mcg IV; Moderate Sedation Time:  26 minutes The patient was continuously monitored during the procedure by the interventional radiology nurse under my direct supervision. FLUOROSCOPY TIME:  1 minutes, 12 seconds (9 mGy) COMPLICATIONS: None immediate. PROCEDURE: The right neck and chest was prepped with chlorhexidine, and draped in the usual sterile fashion using maximum barrier technique (cap and mask, sterile gown, sterile gloves, large sterile sheet, hand hygiene and cutaneous antiseptic). Local anesthesia was attained by infiltration with 1% lidocaine with epinephrine. Ultrasound demonstrated patency of the right internal jugular vein, and this was documented with an image. Under real-time ultrasound guidance, this vein was accessed with a 21 gauge micropuncture needle and image documentation was performed. A small dermatotomy was made at the access site with an 11 scalpel. A 0.018" wire was advanced into the SVC and the access needle exchanged for a 4F micropuncture vascular sheath. The 0.018" wire was then removed and a 0.035" wire advanced into the IVC. An appropriate location for the subcutaneous reservoir was selected below the clavicle and an incision was made through  the skin and underlying soft tissues. The subcutaneous tissues were then dissected using a combination of blunt and sharp surgical technique and a pocket was formed. A single lumen power injectable portacatheter was then tunneled through the subcutaneous tissues from the pocket to the dermatotomy and the port reservoir placed within the subcutaneous pocket. The venous access site was then serially dilated and a peel away vascular sheath placed over the wire. The wire was removed and the port catheter advanced into position under fluoroscopic guidance. The catheter tip is positioned in the superior cavoatrial junction. This was documented with a spot image. The portacatheter was then tested and found to flush and aspirate well. The port was flushed with saline followed by 100 units/mL heparinized saline. The pocket was then closed in two layers using first subdermal inverted interrupted absorbable sutures followed by a running subcuticular suture. The epidermis was then sealed with Dermabond. The dermatotomy at the venous access site was also closed with Dermabond. IMPRESSION: Successful placement of a right IJ approach Power Port with ultrasound and fluoroscopic guidance. The catheter is ready for use. Electronically Signed   By: Heath  McCullough M.D.   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 Intensely hypermetabolic bilateral neck, bilateral axillary, bilateral mediastinal, bilateral hilar and retroperitoneal lymphadenopathy. Numerous small hypermetabolic splenic masses. Findings are compatible with malignancy, favoring lymphoproliferative disorder." 04/10/2020 Left Cervical Lymph Node Bx ([id number]) revealed "Diffuse large B-cell lymphoma. KI-67 is 80-90%" FISH panel neg for double/triplet hit lymphoma PLAN: -Discussed pt labwork today, 05/26/20; blood counts &  chemistries are holding, Uric acid is WNL, LDH is elevated - likely from Neulasta. -The pt has no prohibitive toxicities from continuing C3 EPOCH-R in 1 week as inpatient from 12/27. -My RN has setup inpatient chemotherapy. Patient shall be getting covid testing on the unit. Chemotherapy orders signed. I shall be off service and my collegue on call would be staff the patient. -Advised pt that we will hold Intrathecal Methotrexate for   CNS prophylaxis until C4. -Recommend pt f/u for PET/CT as scheduled -Recommend pt to discontinue Allopurinol  -Will increase Marinol to 5 mg BID -Rx Marinol, Zofran, Dexamethasone, Tylenol #3 -Will see back in 18 days with labs   FOLLOW UP: Inpatient EPOCH chemotherapy for 5 days from 06/02/2020 Outpatient Rituxan on 06/09/2020 Portflush, labs and MD visit with Dr  on 06/13/2020  All of the patient's questions were answered with apparent satisfaction. The patient knows to call the clinic with any problems, questions or concerns.      MD MS AAHIVMS SCH CTH Hematology/Oncology Physician [facility]  (Office):       [phone] (Work cell):  [phone] (Fax):           [phone]  05/26/2020 4:49 PM  I, Jazzmine Knight, am acting as a scribe for Dr.  .   .I have reviewed the above documentation for accuracy and completeness, and I agree with the above. . Kishore  MD    Addendum  PET/CT 12/22   IMPRESSION: 1. Marked interval response to therapy with resolution of nearly all areas of increased FDG uptake compared to the previous study. Most areas with residual uptake Deauville category 3. Subcarinal and RIGHT thoracic inlet with uptake slightly greater than liver, technically Deauville category 4 as described. Maximum SUV on today's study is 4.3 in the subcarinal region. 2. No new signs of disease. 3. Diffuse marrow activity presumably related to marrow stimulation. Suggest attention on  follow-up.  Aortic Atherosclerosis (ICD10-I70.0).   Electronically Signed   By: Geoffrey  Wile M.D.   On: 05/28/2020 16:42   Plan - called and discussed results with patient and wife. Excellent response to rx-- will keep dose levels same as C2. -G-CSF shot- wife administers at home.   The total time spent in the appt was 40 minutes and more than 50% was on counseling and direct patient cares.    

## 2020-05-28 ENCOUNTER — Ambulatory Visit (HOSPITAL_COMMUNITY)
Admission: RE | Admit: 2020-05-28 | Discharge: 2020-05-28 | Disposition: A | Discharge status: Home / Self Care | Payer: 59 | Source: Ambulatory Visit | Attending: Hematology | Admitting: Hematology

## 2020-05-28 ENCOUNTER — Telehealth: Payer: Self-pay | Admitting: Hematology

## 2020-05-28 ENCOUNTER — Other Ambulatory Visit: Payer: Self-pay

## 2020-05-28 DIAGNOSIS — C8338 Diffuse large B-cell lymphoma, lymph nodes of multiple sites: Secondary | ICD-10-CM | POA: Diagnosis present

## 2020-05-28 LAB — GLUCOSE, CAPILLARY: Glucose-Capillary: 85 mg/dL (ref 70–99)

## 2020-05-28 MED ORDER — FLUDEOXYGLUCOSE F - 18 (FDG) INJECTION
7.3500 | Freq: Once | INTRAVENOUS | Status: AC
  Administered 2020-05-28: 15:00:00 7.35 via INTRAVENOUS

## 2020-05-28 NOTE — Telephone Encounter (Signed)
Scheduled per 12/20 los, patient has been called and notified. 

## 2020-06-02 ENCOUNTER — Inpatient Hospital Stay (HOSPITAL_COMMUNITY)
Admission: AD | Admit: 2020-06-02 | Discharge: 2020-06-06 | DRG: 847 | Disposition: A | Discharge status: Home / Self Care | Payer: 59 | Attending: Hematology | Admitting: Hematology

## 2020-06-02 ENCOUNTER — Encounter (HOSPITAL_COMMUNITY): Payer: Self-pay | Admitting: Hematology

## 2020-06-02 ENCOUNTER — Other Ambulatory Visit: Payer: Self-pay

## 2020-06-02 DIAGNOSIS — Z800 Family history of malignant neoplasm of digestive organs: Secondary | ICD-10-CM | POA: Diagnosis not present

## 2020-06-02 DIAGNOSIS — Z8042 Family history of malignant neoplasm of prostate: Secondary | ICD-10-CM | POA: Diagnosis not present

## 2020-06-02 DIAGNOSIS — Z95828 Presence of other vascular implants and grafts: Secondary | ICD-10-CM

## 2020-06-02 DIAGNOSIS — Z8546 Personal history of malignant neoplasm of prostate: Secondary | ICD-10-CM

## 2020-06-02 DIAGNOSIS — C8598 Non-Hodgkin lymphoma, unspecified, lymph nodes of multiple sites: Secondary | ICD-10-CM

## 2020-06-02 DIAGNOSIS — Z20822 Contact with and (suspected) exposure to covid-19: Secondary | ICD-10-CM | POA: Diagnosis present

## 2020-06-02 DIAGNOSIS — C8330 Diffuse large B-cell lymphoma, unspecified site: Secondary | ICD-10-CM | POA: Diagnosis present

## 2020-06-02 DIAGNOSIS — K5900 Constipation, unspecified: Secondary | ICD-10-CM | POA: Diagnosis present

## 2020-06-02 DIAGNOSIS — Z8052 Family history of malignant neoplasm of bladder: Secondary | ICD-10-CM | POA: Diagnosis not present

## 2020-06-02 DIAGNOSIS — C8338 Diffuse large B-cell lymphoma, lymph nodes of multiple sites: Secondary | ICD-10-CM

## 2020-06-02 DIAGNOSIS — R110 Nausea: Secondary | ICD-10-CM | POA: Diagnosis not present

## 2020-06-02 DIAGNOSIS — Z5111 Encounter for antineoplastic chemotherapy: Secondary | ICD-10-CM | POA: Diagnosis present

## 2020-06-02 DIAGNOSIS — Z7189 Other specified counseling: Secondary | ICD-10-CM

## 2020-06-02 LAB — CBC WITH DIFFERENTIAL/PLATELET
Abs Immature Granulocytes: 3.38 10*3/uL — ABNORMAL HIGH (ref 0.00–0.07)
Basophils Absolute: 0.4 10*3/uL — ABNORMAL HIGH (ref 0.0–0.1)
Basophils Relative: 2 %
Eosinophils Absolute: 0.3 10*3/uL (ref 0.0–0.5)
Eosinophils Relative: 1 %
HCT: 31.6 % — ABNORMAL LOW (ref 39.0–52.0)
Hemoglobin: 10.5 g/dL — ABNORMAL LOW (ref 13.0–17.0)
Immature Granulocytes: 17 %
Lymphocytes Relative: 6 %
Lymphs Abs: 1.2 10*3/uL (ref 0.7–4.0)
MCH: 30.4 pg (ref 26.0–34.0)
MCHC: 33.2 g/dL (ref 30.0–36.0)
MCV: 91.6 fL (ref 80.0–100.0)
Monocytes Absolute: 1.3 10*3/uL — ABNORMAL HIGH (ref 0.1–1.0)
Monocytes Relative: 7 %
Neutro Abs: 13.5 10*3/uL — ABNORMAL HIGH (ref 1.7–7.7)
Neutrophils Relative %: 67 %
Platelets: 250 10*3/uL (ref 150–400)
RBC: 3.45 MIL/uL — ABNORMAL LOW (ref 4.22–5.81)
RDW: 14.6 % (ref 11.5–15.5)
WBC Morphology: INCREASED
WBC: 20 10*3/uL — ABNORMAL HIGH (ref 4.0–10.5)
nRBC: 0 % (ref 0.0–0.2)

## 2020-06-02 LAB — COMPREHENSIVE METABOLIC PANEL
ALT: 35 U/L (ref 0–44)
AST: 20 U/L (ref 15–41)
Albumin: 3.6 g/dL (ref 3.5–5.0)
Alkaline Phosphatase: 103 U/L (ref 38–126)
Anion gap: 6 (ref 5–15)
BUN: 14 mg/dL (ref 8–23)
CO2: 26 mmol/L (ref 22–32)
Calcium: 8.9 mg/dL (ref 8.9–10.3)
Chloride: 108 mmol/L (ref 98–111)
Creatinine, Ser: 0.56 mg/dL — ABNORMAL LOW (ref 0.61–1.24)
GFR, Estimated: >60 mL/min (ref >60)
Glucose, Bld: 105 mg/dL — ABNORMAL HIGH (ref 70–99)
Potassium: 3.9 mmol/L (ref 3.5–5.1)
Sodium: 140 mmol/L (ref 135–145)
Total Bilirubin: 0.2 mg/dL — ABNORMAL LOW (ref 0.3–1.2)
Total Protein: 6 g/dL — ABNORMAL LOW (ref 6.5–8.1)

## 2020-06-02 LAB — RESP PANEL BY RT-PCR (FLU A&B, COVID) ARPGX2
Influenza A by PCR: NEGATIVE
Influenza B by PCR: NEGATIVE
SARS Coronavirus 2 by RT PCR: NEGATIVE

## 2020-06-02 MED ORDER — LORAZEPAM 0.5 MG PO TABS
0.5000 mg | ORAL_TABLET | Freq: Every day | ORAL | Status: DC
Start: 2020-06-02 — End: 2020-06-06
  Administered 2020-06-02 – 2020-06-05 (×4): 0.5 mg via ORAL
  Filled 2020-06-02 (×4): qty 1

## 2020-06-02 MED ORDER — LORAZEPAM 0.5 MG PO TABS: 0.5000 mg | ORAL_TABLET | Freq: Four times a day (QID) | ORAL | Status: DC | PRN

## 2020-06-02 MED ORDER — SENNOSIDES-DOCUSATE SODIUM 8.6-50 MG PO TABS: 1.0000 | ORAL_TABLET | Freq: Every evening | ORAL | Status: DC | PRN

## 2020-06-02 MED ORDER — ENOXAPARIN SODIUM 40 MG/0.4ML ~~LOC~~ SOLN
40.0000 mg | SUBCUTANEOUS | Status: DC
  Administered 2020-06-02: 15:00:00 40 mg via SUBCUTANEOUS
  Filled 2020-06-02: qty 0.4

## 2020-06-02 MED ORDER — POTASSIUM CHLORIDE CRYS ER 20 MEQ PO TBCR
20.0000 meq | EXTENDED_RELEASE_TABLET | Freq: Every day | ORAL | Status: DC
Start: 2020-06-02 — End: 2020-06-06
  Administered 2020-06-02 – 2020-06-05 (×4): 20 meq via ORAL
  Filled 2020-06-02 (×4): qty 1

## 2020-06-02 MED ORDER — ACETAMINOPHEN 325 MG PO TABS: 650.0000 mg | ORAL_TABLET | ORAL | Status: DC | PRN

## 2020-06-02 MED ORDER — POLYETHYLENE GLYCOL 3350 17 G PO PACK
17.0000 g | PACK | Freq: Every day | ORAL | Status: DC
  Administered 2020-06-03 – 2020-06-05 (×3): 17 g via ORAL
  Filled 2020-06-02 (×4): qty 1

## 2020-06-02 MED ORDER — VINCRISTINE SULFATE CHEMO INJECTION 1 MG/ML
Freq: Once | INTRAVENOUS | Status: AC
  Filled 2020-06-02: qty 10

## 2020-06-02 MED ORDER — PREDNISONE 20 MG PO TABS
60.0000 mg | ORAL_TABLET | Freq: Once | ORAL | Status: AC
  Administered 2020-06-02: 15:00:00 60 mg via ORAL
  Filled 2020-06-02: qty 3

## 2020-06-02 MED ORDER — SODIUM CHLORIDE 0.9 % IV SOLN: INTRAVENOUS | Status: DC

## 2020-06-02 MED ORDER — ACETAMINOPHEN-CODEINE #3 300-30 MG PO TABS
1.0000 | ORAL_TABLET | ORAL | Status: DC | PRN
Start: 2020-06-02 — End: 2020-06-06
  Administered 2020-06-02 – 2020-06-06 (×9): 1 via ORAL
  Filled 2020-06-02 (×9): qty 1

## 2020-06-02 MED ORDER — DRONABINOL 5 MG PO CAPS
5.0000 mg | ORAL_CAPSULE | Freq: Two times a day (BID) | ORAL | Status: DC
Start: 2020-06-02 — End: 2020-06-06
  Administered 2020-06-02 – 2020-06-05 (×7): 5 mg via ORAL
  Filled 2020-06-02 (×7): qty 1

## 2020-06-02 MED ORDER — PROCHLORPERAZINE MALEATE 10 MG PO TABS
10.0000 mg | ORAL_TABLET | Freq: Four times a day (QID) | ORAL | Status: DC | PRN
  Administered 2020-06-04 – 2020-06-05 (×5): 10 mg via ORAL
  Filled 2020-06-02 (×5): qty 1

## 2020-06-02 MED ORDER — SENNOSIDES-DOCUSATE SODIUM 8.6-50 MG PO TABS: 1.0000 | ORAL_TABLET | Freq: Two times a day (BID) | ORAL | Status: DC | PRN

## 2020-06-02 MED ORDER — CHLORHEXIDINE GLUCONATE CLOTH 2 % EX PADS
6.0000 | MEDICATED_PAD | Freq: Every day | CUTANEOUS | Status: DC
  Administered 2020-06-03 – 2020-06-05 (×3): 6 via TOPICAL

## 2020-06-02 MED ORDER — SODIUM CHLORIDE 0.9 % IV SOLN
Freq: Once | INTRAVENOUS | Status: AC
  Administered 2020-06-02: 15:00:00 8 mg via INTRAVENOUS
  Filled 2020-06-02: qty 4

## 2020-06-02 MED ORDER — PREDNISONE 20 MG PO TABS
60.0000 mg | ORAL_TABLET | Freq: Every day | ORAL | Status: DC
  Administered 2020-06-03 – 2020-06-06 (×4): 60 mg via ORAL
  Filled 2020-06-02 (×4): qty 3

## 2020-06-02 MED ORDER — SODIUM BICARBONATE/SODIUM CHLORIDE MOUTHWASH: 1.0000 "application " | Freq: Four times a day (QID) | OROMUCOSAL | Status: DC

## 2020-06-02 NOTE — H&P (Signed)
Patient History and Physical   Bryan [name] [id number] November 26, 1955 64 y.o. 06/02/2020    Patient Identification: 64 year old with non-Hodgkin's lymphoma  HPI:  Mr. Corlett is followed by Dr. Candise Che with a diagnosis of diffuse large B-cell lymphoma.  He has completed 2 cycles of EPOCH-R with the last cycle completed on 05/12/2020-05/16/2020.  Mr. Gaertner reports nausea lasting 8 to 9 days following chemotherapy.  The nausea was relieved with Compazine.  No neuropathy symptoms.  Good appetite.  He has mild pain after receiving G-CSF.  The pain is relieved with Tylenol.  His energy level has improved this week. A restaging PET scan 05/28/2020 revealed a marked interval response to therapy with resolution of almost all areas of increased FDG activity.  No sign of progressive disease.  He is admitted today for cycle 3 of chemotherapy. PMH:  Past Medical History:  Diagnosis Date  . Prostate cancer (HCC)     .  Large B-cell lymphoma-04/08/2020  Past Surgical History:  Procedure Laterality Date  . CRYOTHERAPY    . IR IMAGING GUIDED PORT INSERTION  05/08/2020  . PROSTATE SURGERY      Allergies:  No Known Allergies  Medications:  Medications Prior to Admission  Medication Sig Dispense Refill  . acetaminophen-codeine (TYLENOL #3) 300-30 MG tablet Take 1 tablet by mouth every 4 (four) hours as needed for moderate pain. 30 tablet 0  . dexamethasone (DECADRON) 4 MG tablet Take 1 tablet twice a day for 3 days starting the day after each cycle of chemo (Patient taking differently: Take 4 mg by mouth See admin instructions. Take 1 tablet twice a day for 3 days starting the day after each cycle of chemo) 20 tablet 1  . lidocaine-prilocaine (EMLA) cream Apply 1 application topically as needed. Apply to port site 30 g 0  . LORazepam (ATIVAN) 0.5 MG tablet TAKE 1 TABLET EVERY 6 (SIX) HOURS AS NEEDED (NAUSEA/VOMITING (IF PROCHLORPERAZINE NOT EFFECTIVE)). (Patient taking differently: Take 0.5 mg  by mouth at bedtime.) 30 tablet 0  . polyethylene glycol (MIRALAX / GLYCOLAX) 17 g packet Take 17 g by mouth daily as needed. (Patient taking differently: Take 17 g by mouth daily.) 14 each 0  . prochlorperazine (COMPAZINE) 10 MG tablet TAKE 1 TABLET BY MOUTH EVERY 6 HOURS AS NEEDED FOR NAUSEA OR VOMITING. (Patient taking differently: Take 10 mg by mouth every 6 (six) hours as needed for nausea or vomiting.) 30 tablet 0  . Acetaminophen-Codeine 300-30 MG tablet TAKE 1 TABLET BY MOUTH EVERY 4 (FOUR) HOURS AS NEEDED FOR MODERATE PAIN (Patient not taking: Reported on 06/02/2020) 30 tablet 0  . allopurinol (ZYLOPRIM) 100 MG tablet Take 1 tablet (100 mg total) by mouth 2 (two) times daily. (Patient not taking: Reported on 06/02/2020) 60 tablet 1  . dronabinol (MARINOL) 5 MG capsule Take 1 capsule (5 mg total) by mouth 2 (two) times daily before lunch and supper. 60 capsule 0  . ondansetron (ZOFRAN) 8 MG tablet TAKE 1 TABLET BY MOUTH EVERY 8 HOURS AS NEEDED FOR NAUSEA OR VOMITING. (Patient not taking: Reported on 06/02/2020) 30 tablet 0  . pegfilgrastim-bmez (ZIEXTENZO) 6 MG/0.6ML injection Inject 0.6 mLs (6 mg total) into the skin See admin instructions. Inject 0.6 mLs (6 mg total) into the skin once for 1 dose. 24 hours after each cycle of EPOCH. To be administered by Home Health RN arranged by [facility]oInject 6 mg into the skin See admin instructions. 0.6 mL 4  . potassium chloride SA (KLOR-CON) 20 MEQ tablet  Take 1 tablet (20 mEq total) by mouth daily. (Patient not taking: Reported on 06/02/2020) 7 tablet 0  . senna-docusate (SENOKOT-S) 8.6-50 MG tablet Take 1 tablet by mouth 2 (two) times daily as needed for mild constipation. (Patient not taking: Reported on 06/02/2020)    . Sodium Chloride-Sodium Bicarb (SODIUM BICARBONATE/SODIUM CHLORIDE) SOLN 1 application by Mouth Rinse route 4 (four) times daily. (Patient not taking: Reported on 06/02/2020)      Social History:    reports that he has never  smoked. He has never used smokeless tobacco. He reports previous alcohol use. He reports that he does not use drugs.  Family History:  Family History  Problem Relation Age of Onset  . Bladder Cancer Mother   . Esophageal cancer Mother   . Prostate cancer Father   . Prostate cancer Paternal Uncle     Review of Systems:  Positives include: Malaise and nausea 8 to 9 days following chemotherapy, mild bone pain following G-CSF  A complete ROS was otherwise negative.   Physical Exam:  Blood pressure 117/77, pulse 62, temperature 98.1 F (36.7 C), temperature source Oral, resp. rate 18, SpO2 96 %.  HEENT: No thrush or ulcers Lungs: Clear bilaterally Cardiac: Regular rate and rhythm Abdomen: No hepatosplenomegaly, nontender  Vascular: No leg edema Skin: No rash  Lab Results:  Lab Results  Component Value Date   WBC 20.0 (H) 06/02/2020   HGB 10.5 (L) 06/02/2020   HCT 31.6 (L) 06/02/2020   MCV 91.6 06/02/2020   PLT 250 06/02/2020   NEUTROABS PENDING 06/02/2020      Impression and Plan:  1.  Large B-cell lymphoma, activated B-cell type, 04/08/2020  PET scan [id number] hypermetabolic neck, axillary, mediastinal, hilar, and retroperitoneal lymphadenopathy.  Numerous hypermetabolic splenic masses  [id number] cervical lymph node biopsy  Cycle 1 R-EPOCH 04/21/2020  Cycle 2 R-EPOCH 05/12/2020  PET 05/26/2020-marked improvement in hypermetabolic lymphadenopathy  Cycle 3 R-EPOCH 06/02/2020   2.  Delayed nausea following EPOCH chemotherapy 3.  History of prostate cancer  Mr. Einck is admitted electively for cycle 3 of R-EPOCH chemotherapy for treatment of large B-cell lymphoma.  There has been marked clinical and radiologic improvement following 2 cycles of therapy.  He will begin cycle 3 today.  He reports delayed nausea following chemotherapy.  We will discuss adding medication for delayed nausea and discharge.  Mr. Mcglynn was interviewed and examined.   I reviewed the mission lab studies.  He will begin cycle 3 chemotherapy today.  Betsy Coder, MD  06/02/2020, 2:20 PM

## 2020-06-03 DIAGNOSIS — R110 Nausea: Secondary | ICD-10-CM | POA: Diagnosis not present

## 2020-06-03 DIAGNOSIS — Z8546 Personal history of malignant neoplasm of prostate: Secondary | ICD-10-CM | POA: Diagnosis not present

## 2020-06-03 DIAGNOSIS — C8338 Diffuse large B-cell lymphoma, lymph nodes of multiple sites: Secondary | ICD-10-CM | POA: Diagnosis not present

## 2020-06-03 LAB — COMPREHENSIVE METABOLIC PANEL
ALT: 32 U/L (ref 0–44)
AST: 19 U/L (ref 15–41)
Albumin: 3.3 g/dL — ABNORMAL LOW (ref 3.5–5.0)
Alkaline Phosphatase: 92 U/L (ref 38–126)
Anion gap: 8 (ref 5–15)
BUN: 14 mg/dL (ref 8–23)
CO2: 21 mmol/L — ABNORMAL LOW (ref 22–32)
Calcium: 8.6 mg/dL — ABNORMAL LOW (ref 8.9–10.3)
Chloride: 109 mmol/L (ref 98–111)
Creatinine, Ser: 0.6 mg/dL — ABNORMAL LOW (ref 0.61–1.24)
GFR, Estimated: >60 mL/min (ref >60)
Glucose, Bld: 134 mg/dL — ABNORMAL HIGH (ref 70–99)
Potassium: 4.3 mmol/L (ref 3.5–5.1)
Sodium: 138 mmol/L (ref 135–145)
Total Bilirubin: 0.4 mg/dL (ref 0.3–1.2)
Total Protein: 6 g/dL — ABNORMAL LOW (ref 6.5–8.1)

## 2020-06-03 LAB — LACTATE DEHYDROGENASE: LDH: 174 U/L (ref 98–192)

## 2020-06-03 MED ORDER — VINCRISTINE SULFATE CHEMO INJECTION 1 MG/ML
Freq: Once | INTRAVENOUS | Status: AC
  Filled 2020-06-03: qty 10

## 2020-06-03 MED ORDER — SODIUM CHLORIDE 0.9 % IV SOLN
Freq: Once | INTRAVENOUS | Status: AC
  Administered 2020-06-03: 14:00:00 18 mg via INTRAVENOUS
  Filled 2020-06-03: qty 4

## 2020-06-03 MED ORDER — SODIUM CHLORIDE 0.9 % IV SOLN
Freq: Once | INTRAVENOUS | Status: AC
  Administered 2020-06-04: 11:00:00 18 mg via INTRAVENOUS
  Filled 2020-06-03: qty 4

## 2020-06-03 NOTE — Progress Notes (Signed)
IP PROGRESS NOTE  Subjective:   Bryan [name] began chemotherapy yesterday afternoon.  No nausea.  No complaint.  Objective: Vital signs in last 24 hours: Blood pressure 101/63, pulse 63, temperature 97.8 F (36.6 C), temperature source Oral, resp. rate 16, height 5\' 7"  (1.702 m), weight 139 lb 1.8 oz (63.1 kg), SpO2 98 %.  Intake/Output from previous day: 12/27 0701 - 12/28 0700 In: 287.7 [I.V.:287.7] Out: -   Physical Exam:  HEENT: No thrush Lungs: Clear bilaterally Cardiac: Regular rate and rhythm Abdomen: No splenomegaly, nontender Extremities: No leg edema   Portacath/PICC-without erythema  Lab Results: Recent Labs    06/02/20 1258  WBC 20.0*  HGB 10.5*  HCT 31.6*  PLT 250    BMET Recent Labs    06/02/20 1258 06/03/20 0521  NA 140 138  K 3.9 4.3  CL 108 109  CO2 26 21*  GLUCOSE 105* 134*  BUN 14 14  CREATININE 0.56* 0.60*  CALCIUM 8.9 8.6*    Medications: I have reviewed the patient's current medications.  Assessment/Plan:  1.  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  Numerous hypermetabolic splenic masses  04/10/2020-left cervical lymph node biopsy  Cycle 1 R-EPOCH 04/21/2020  Cycle 2 R-EPOCH 05/12/2020  PET 05/26/2020-marked improvement in hypermetabolic lymphadenopathy  Cycle 3 R-EPOCH 06/02/2020   2.  Delayed nausea following EPOCH chemotherapy 3.  History of prostate cancer  Bryan [name] appears to be tolerating the chemotherapy well.  He will complete day 2 of infusional chemotherapy beginning this afternoon.  We will consider increasing the infusion rate with bag #3 and #4 so that he can be discharged at an earlier time on 06/06/2020.   LOS: 1 day   06/08/2020, MD   06/03/2020, 7:29 AM

## 2020-06-04 ENCOUNTER — Other Ambulatory Visit: Payer: Self-pay | Admitting: *Deleted

## 2020-06-04 DIAGNOSIS — Z8546 Personal history of malignant neoplasm of prostate: Secondary | ICD-10-CM | POA: Diagnosis not present

## 2020-06-04 DIAGNOSIS — R110 Nausea: Secondary | ICD-10-CM | POA: Diagnosis not present

## 2020-06-04 DIAGNOSIS — C8338 Diffuse large B-cell lymphoma, lymph nodes of multiple sites: Secondary | ICD-10-CM | POA: Diagnosis not present

## 2020-06-04 DIAGNOSIS — C8598 Non-Hodgkin lymphoma, unspecified, lymph nodes of multiple sites: Secondary | ICD-10-CM

## 2020-06-04 MED ORDER — SODIUM CHLORIDE 0.9 % IV SOLN
Freq: Once | INTRAVENOUS | Status: AC
  Administered 2020-06-05: 10:00:00 18 mg via INTRAVENOUS
  Filled 2020-06-04: qty 4

## 2020-06-04 MED ORDER — PROCHLORPERAZINE MALEATE 10 MG PO TABS: ORAL_TABLET | ORAL | 2 refills | Status: DC

## 2020-06-04 MED ORDER — VINCRISTINE SULFATE CHEMO INJECTION 1 MG/ML
Freq: Once | INTRAVENOUS | Status: AC
  Filled 2020-06-04: qty 10

## 2020-06-04 NOTE — Progress Notes (Signed)
No need for daily labs per Dr. Truett Perna.  Ebony Hail, Pharm.D., CPP 06/04/2020@10 :29 AM

## 2020-06-04 NOTE — Progress Notes (Signed)
IP PROGRESS NOTE  Subjective:   Mr. Heiny reports mild nausea relieved with lorazepam.  He would like to discontinue Lovenox prophylaxis.  He is ambulating and exercising.  Objective: Vital signs in last 24 hours: Blood pressure 125/82, pulse 71, temperature 98 F (36.7 C), temperature source Oral, resp. rate 14, height 5\' 7"  (1.702 m), weight 139 lb 1.8 oz (63.1 kg), SpO2 97 %.  Intake/Output from previous day: 12/28 0701 - 12/29 0700 In: 587 [I.V.:287; IV Piggyback:300] Out: -   Physical Exam:  HEENT: No thrush Lungs: Clear bilaterally Cardiac: Regular rate and rhythm Abdomen: No splenomegaly, nontender Extremities: No leg edema   Portacath/PICC-without erythema  Lab Results: Recent Labs    06/02/20 1258  WBC 20.0*  HGB 10.5*  HCT 31.6*  PLT 250    BMET Recent Labs    06/02/20 1258 06/03/20 0521  NA 140 138  K 3.9 4.3  CL 108 109  CO2 26 21*  GLUCOSE 105* 134*  BUN 14 14  CREATININE 0.56* 0.60*  CALCIUM 8.9 8.6*    Medications: I have reviewed the patient's current medications.  Assessment/Plan:  1.  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  Numerous hypermetabolic splenic masses  04/10/2020-left cervical lymph node biopsy  Cycle 1 R-EPOCH 04/21/2020  Cycle 2 R-EPOCH 05/12/2020  PET 05/26/2020-marked improvement in hypermetabolic lymphadenopathy  Cycle 3 R-EPOCH 06/02/2020   2.  Delayed nausea following EPOCH chemotherapy 3.  History of prostate cancer  Mr. Maniaci continues to tolerate the chemotherapy well.  He will begin day 3 of infusional chemotherapy today.  I think it is reasonable to discontinue Lovenox prophylaxis since he is ambulating.  We will be sure he has the G-CSF in place for home administration.  LOS: 2 days   Felipa Furnace, MD   06/04/2020, 8:35 AM

## 2020-06-04 NOTE — Telephone Encounter (Signed)
Contacted wife [name]. She has arranged with mail order pharmacy to ship Ziextenzo so that it will be received this weekend. Patient is to take once, by injection, 24 hours after last chemotherapy infusion [facility]). She states patient needs refill of compazine. Refill sent to pharmacy

## 2020-06-05 DIAGNOSIS — C8338 Diffuse large B-cell lymphoma, lymph nodes of multiple sites: Secondary | ICD-10-CM | POA: Diagnosis not present

## 2020-06-05 MED ORDER — SODIUM CHLORIDE 0.9 % IV SOLN
Freq: Once | INTRAVENOUS | Status: AC
  Administered 2020-06-06: 16 mg via INTRAVENOUS
  Filled 2020-06-05: qty 8

## 2020-06-05 MED ORDER — SODIUM CHLORIDE 0.9 % IV SOLN
750.0000 mg/m2 | Freq: Once | INTRAVENOUS | Status: AC
  Administered 2020-06-06: 1300 mg via INTRAVENOUS
  Filled 2020-06-05: qty 65

## 2020-06-05 MED ORDER — ACETAMINOPHEN 325 MG PO TABS: 650.0000 mg | ORAL_TABLET | ORAL | Status: DC | PRN

## 2020-06-05 NOTE — Progress Notes (Addendum)
IP PROGRESS NOTE  Subjective:   [name] continues to have mild nausea but no vomiting.  Nausea controlled with as needed antiemetics.  He continues to ambulate exercise without any difficulty.  Objective: Vital signs in last 24 hours: Blood pressure 121/80, pulse (!) 55, temperature 97.9 F (36.6 C), temperature source Oral, resp. rate 14, height 5\' 7"  (1.702 m), weight 63.1 kg, SpO2 98 %.  Intake/Output from previous day: 12/29 0701 - 12/30 0700 In: 680 [P.O.:680] Out: -   Physical Exam:  HEENT: No thrush Lungs: Clear bilaterally Cardiac: Regular rate and rhythm Abdomen: No splenomegaly, nontender Extremities: No leg edema   Portacath/PICC-without erythema  Lab Results: Recent Labs    06/02/20 1258  WBC 20.0*  HGB 10.5*  HCT 31.6*  PLT 250    BMET Recent Labs    06/02/20 1258 06/03/20 0521  NA 140 138  K 3.9 4.3  CL 108 109  CO2 26 21*  GLUCOSE 105* 134*  BUN 14 14  CREATININE 0.56* 0.60*  CALCIUM 8.9 8.6*    Medications: I have reviewed the patient's current medications.  Assessment/Plan:  1.  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  Numerous hypermetabolic splenic masses  04/10/2020-left cervical lymph node biopsy  Cycle 1 R-EPOCH 04/21/2020  Cycle 2 R-EPOCH 05/12/2020  PET 05/26/2020-marked improvement in hypermetabolic lymphadenopathy  Cycle 3 R-EPOCH 06/02/2020   2.  Delayed nausea following EPOCH chemotherapy 3.  History of prostate cancer  Bryan [name] continues to tolerate the chemotherapy well.  He will begin day 4 of infusional chemotherapy today.  Continue as needed antiemetics.  Recommend for him to continue to exercise and ambulate as tolerated.  Anticipate discharge to home on 06/06/2020. G-CSF has been arranged for home administration.  He is scheduled for rituximab on 06/09/2020 at the  cancer center and follow-up visit with Dr. 08/07/2020 on 06/13/2020.   LOS: 3 days   08/11/2020, NP   06/05/2020, 8:58 AM Bryan [name] was interviewed and examined.  He will complete day 4 of infusional chemotherapy beginning later today.  He will receive cyclophosphamide prior to discharge tomorrow.  He appears stable.

## 2020-06-05 NOTE — Discharge Summary (Addendum)
Discharge Summary  Patient ID: Bryan [name] MRN: [id number] DOB/AGE: [birth year] 64 y.o.  Admit date: 06/02/2020 Discharge date: 06/06/2020  Discharge Diagnoses:  Active Problems:   Large cell (diffuse) non-Hodgkin's lymphoma (HCC)  Discharged Condition: good  Discharge Labs:   CBC    Component Value Date/Time   WBC 20.0 (H) 06/02/2020 1258   RBC 3.45 (L) 06/02/2020 1258   HGB 10.5 (L) 06/02/2020 1258   HGB 10.0 (L) 05/26/2020 1505   HCT 31.6 (L) 06/02/2020 1258   PLT 250 06/02/2020 1258   PLT 204 05/26/2020 1505   MCV 91.6 06/02/2020 1258   MCH 30.4 06/02/2020 1258   MCHC 33.2 06/02/2020 1258   RDW 14.6 06/02/2020 1258   LYMPHSABS 1.2 06/02/2020 1258   MONOABS 1.3 (H) 06/02/2020 1258   EOSABS 0.3 06/02/2020 1258   BASOSABS 0.4 (H) 06/02/2020 1258   CMP Latest Ref Rng & Units 06/03/2020 06/02/2020 05/26/2020  Glucose 70 - 99 mg/dL 134(H) 105(H) 103(H)  BUN 8 - 23 mg/dL 14 14 8   Creatinine 0.61 - 1.24 mg/dL 0.60(L) 0.56(L) 0.79  Sodium 135 - 145 mmol/L 138 140 142  Potassium 3.5 - 5.1 mmol/L 4.3 3.9 4.0  Chloride 98 - 111 mmol/L 109 108 106  CO2 22 - 32 mmol/L 21(L) 26 26  Calcium 8.9 - 10.3 mg/dL 8.6(L) 8.9 8.6(L)  Total Protein 6.5 - 8.1 g/dL 6.0(L) 6.0(L) 5.9(L)  Total Bilirubin 0.3 - 1.2 mg/dL 0.4 0.2(L) <0.2(L)  Alkaline Phos 38 - 126 U/L 92 103 112  AST 15 - 41 U/L 19 20 34  ALT 0 - 44 U/L 32 35 72(H)    Significant Diagnostic Studies: None  Consults: None  Procedures: None  Disposition:  There are no questions and answers to display.       Allergies as of 06/05/2020   No Known Allergies     Medication List    STOP taking these medications   allopurinol 100 MG tablet Commonly known as: ZYLOPRIM   potassium chloride SA 20 MEQ tablet Commonly known as: KLOR-CON     TAKE these medications   acetaminophen 325 MG tablet Commonly known as: TYLENOL Take 2 tablets (650 mg total) by mouth every 4 (four) hours as needed for mild  pain.   acetaminophen-codeine 300-30 MG tablet Commonly known as: TYLENOL #3 Take 1 tablet by mouth every 4 (four) hours as needed for moderate pain. What changed: Another medication with the same name was removed. Continue taking this medication, and follow the directions you see here.   dexamethasone 4 MG tablet Commonly known as: DECADRON Take 1 tablet twice a day for 3 days starting the day after each cycle of chemo What changed:   how much to take  how to take this  when to take this   dronabinol 5 MG capsule Commonly known as: MARINOL Take 1 capsule (5 mg total) by mouth 2 (two) times daily before lunch and supper.   lidocaine-prilocaine cream Commonly known as: EMLA Apply 1 application topically as needed. Apply to port site   LORazepam 0.5 MG tablet Commonly known as: ATIVAN TAKE 1 TABLET EVERY 6 (SIX) HOURS AS NEEDED (NAUSEA/VOMITING (IF PROCHLORPERAZINE NOT EFFECTIVE)). What changed: See the new instructions.   ondansetron 8 MG tablet Commonly known as: ZOFRAN TAKE 1 TABLET BY MOUTH EVERY 8 HOURS AS NEEDED FOR NAUSEA OR VOMITING.   polyethylene glycol 17 g packet Commonly known as: MIRALAX / GLYCOLAX Take 17 g by mouth daily as needed. What changed:  when to take this   prochlorperazine 10 MG tablet Commonly known as: COMPAZINE TAKE 1 TABLET BY MOUTH EVERY 6 HOURS AS NEEDED FOR NAUSEA OR VOMITING. What changed: See the new instructions.   senna-docusate 8.6-50 MG tablet Commonly known as: Senokot-S Take 1 tablet by mouth 2 (two) times daily as needed for mild constipation.   sodium bicarbonate/sodium chloride Soln 1 application by Mouth Rinse route 4 (four) times daily.   Ziextenzo 6 MG/0.6ML injection Generic drug: pegfilgrastim-bmez Inject 0.6 mLs (6 mg total) into the skin See admin instructions. Inject 0.6 mLs (6 mg total) into the skin once for 1 dose. 24 hours after each cycle of EPOCH. To be administered by Home Health RN arranged by  [facility]ject 6 mg into the skin See admin instructions.       HPI:  Bryan [name] is a 64 y.o. male who has been referred to [place] by Dr. Tammi Klippel for evaluation and management of lung mass.  About 6 months ago pt began having abdominal pain after eating. He saw several physicians who could not explain his symptoms.  He then developed extreme discomfort in his chest and red stools. He is unsure if the color of his stools was from blood or diet. Pt then began experiencing left arm pain and pleuritic chest pain, which triggered his Chest CTA in October.  CTA chest ruled out PE but showed bulky mediastinal and bilateral hilar adenopathy.  He was referred to hematology oncology at [facility] and a PET scan and biopsy were ordered.  There were significant delays in getting these tests completed.  Bryan [name] then establish care here in [place], [place] with hopes of expediting his work-up.  He underwent a PET scan on 04/08/2020 which showed intensely hypermetabolic bilateral neck, bilateral axillary, bilateral mediastinal, bilateral hilar, and retroperitoneal lymphadenopathy, numerous small hypermetabolic splenic masses.  He then had an ultrasound-guided biopsy of a left supraclavicular lymph node which showed diffuse large B-cell lymphoma.  MRI of the brain with and without contrast was performed on 04/18/2020 which showed no acute intracranial process and no evidence for metastatic disease.  It was recommended for the patient to begin systemic chemotherapy with EPOCH-R.   The patient was seen on the day of admission prior to cycle #3 of his chemotherapy.  On admission, he reported nausea lasting 8 to 9 days following cycle #2 of his chemotherapy.  The nausea was relieved with Compazine.  No neuropathy symptoms.  He reported a good appetite.  He had mild pain after receiving G-CSF. The pain is relieved with Tylenol.  His energy level was improving the week prior to admission.  A restaging PET scan  05/28/2020 revealed a marked interval response to therapy with resolution of almost all areas of increased FDG activity.  No sign of progressive disease.  The patient was admitted for cycle #3 of his chemotherapy.  Hospital Course: Bryan [name] started his chemotherapy as planned on the day of admission.  He overall tolerated his chemotherapy well with the exception of grade 1 nausea.  No vomiting was reported.  Nausea was overall controlled with as needed antiemetics.  He did not have any mucositis.  Constipation was well controlled with scheduled MiraLAX and Senokot-S. Lovenox was discontinued per the patient request. He maintained a good activity level and routine exercised. The patient will complete his planned chemotherapy on 06/06/2020 and will be discharged to home once chemotherapy is completed. He will be seen on the morning of 06/06/20 and discharge orders will  be entered at that time.   Physical Exam Constitutional:      General: He is not in acute distress.    Appearance: Normal appearance.  HENT:     Head: Normocephalic and atraumatic.     Nose: Nose normal.  Eyes:     Extraocular Movements: Extraocular movements intact.     Conjunctiva/sclera: Conjunctivae normal.     Pupils: Pupils are equal, round, and reactive to light.  Cardiovascular:     Rate and Rhythm: Normal rate and regular rhythm.     Pulses: Normal pulses.     Heart sounds: Normal heart sounds.  Pulmonary:     Effort: Pulmonary effort is normal.     Breath sounds: Normal breath sounds.  Abdominal:     General: Abdomen is flat. Bowel sounds are normal.     Palpations: Abdomen is soft.  Musculoskeletal:        General: Normal range of motion.  Skin:    General: Skin is warm and dry.  Neurological:     General: No focal deficit present.     Mental Status: He is alert and oriented to person, place, and time. Mental status is at baseline.  Psychiatric:        Mood and Affect: Mood normal.        Behavior:  Behavior normal.        Thought Content: Thought content normal.        Judgment: Judgment normal.     The patient will administer G-CSF at home this weekend. He will return to the cancer center on 06/09/2020 for rituximab and will have a follow-up visit with lab work on 06/13/2020.  Signed: Clenton Pare 06/05/2020, 4:26 PM   Bryan [name] was interviewed and examined on the day of discharge. He has mild nausea and no other complaint. He completed the course of chemotherapy in the a.m. on 06/06/2020 and was discharged to home. Outpatient follow-up is scheduled at the Cancer center.

## 2020-06-06 ENCOUNTER — Other Ambulatory Visit: Payer: Self-pay | Admitting: Oncology

## 2020-06-06 ENCOUNTER — Other Ambulatory Visit: Payer: Self-pay | Admitting: Hematology

## 2020-06-06 DIAGNOSIS — C8598 Non-Hodgkin lymphoma, unspecified, lymph nodes of multiple sites: Secondary | ICD-10-CM

## 2020-06-06 DIAGNOSIS — C8338 Diffuse large B-cell lymphoma, lymph nodes of multiple sites: Secondary | ICD-10-CM | POA: Diagnosis not present

## 2020-06-06 MED ORDER — HEPARIN SOD (PORK) LOCK FLUSH 100 UNIT/ML IV SOLN
500.0000 [IU] | INTRAVENOUS | Status: AC | PRN
  Administered 2020-06-06: 500 [IU]
  Filled 2020-06-06: qty 5

## 2020-06-06 NOTE — Discharge Instructions (Signed)
Call for a fever or bleeding Take G-CSF injection as ordered on 06/07/2020

## 2020-06-09 ENCOUNTER — Other Ambulatory Visit: Payer: Self-pay

## 2020-06-09 ENCOUNTER — Inpatient Hospital Stay: Payer: 59 | Attending: Hematology

## 2020-06-09 VITALS — BP 102/67 | HR 71 | Temp 97.9°F | Resp 18 | Wt 137.5 lb

## 2020-06-09 DIAGNOSIS — I700 Atherosclerosis of aorta: Secondary | ICD-10-CM | POA: Insufficient documentation

## 2020-06-09 DIAGNOSIS — C8338 Diffuse large B-cell lymphoma, lymph nodes of multiple sites: Secondary | ICD-10-CM

## 2020-06-09 DIAGNOSIS — C8598 Non-Hodgkin lymphoma, unspecified, lymph nodes of multiple sites: Secondary | ICD-10-CM | POA: Diagnosis present

## 2020-06-09 DIAGNOSIS — Z8546 Personal history of malignant neoplasm of prostate: Secondary | ICD-10-CM | POA: Diagnosis not present

## 2020-06-09 DIAGNOSIS — Z5112 Encounter for antineoplastic immunotherapy: Secondary | ICD-10-CM | POA: Diagnosis present

## 2020-06-09 DIAGNOSIS — Z7189 Other specified counseling: Secondary | ICD-10-CM

## 2020-06-09 MED ORDER — METHYLPREDNISOLONE SODIUM SUCC 125 MG IJ SOLR
INTRAMUSCULAR | Status: AC
  Filled 2020-06-09: qty 2

## 2020-06-09 MED ORDER — SODIUM CHLORIDE 0.9 % IV SOLN
375.0000 mg/m2 | Freq: Once | INTRAVENOUS | Status: AC
  Administered 2020-06-09: 600 mg via INTRAVENOUS
  Filled 2020-06-09: qty 50

## 2020-06-09 MED ORDER — HEPARIN SOD (PORK) LOCK FLUSH 100 UNIT/ML IV SOLN
500.0000 [IU] | Freq: Once | INTRAVENOUS | Status: AC | PRN
  Administered 2020-06-09: 500 [IU]
  Filled 2020-06-09: qty 5

## 2020-06-09 MED ORDER — SODIUM CHLORIDE 0.9 % IV SOLN
Freq: Once | INTRAVENOUS | Status: AC
  Filled 2020-06-09: qty 250

## 2020-06-09 MED ORDER — DIPHENHYDRAMINE HCL 25 MG PO CAPS
50.0000 mg | ORAL_CAPSULE | Freq: Once | ORAL | Status: AC
  Administered 2020-06-09: 50 mg via ORAL

## 2020-06-09 MED ORDER — SODIUM CHLORIDE 0.9% FLUSH
10.0000 mL | INTRAVENOUS | Status: DC | PRN
Start: 2020-06-09 — End: 2020-06-09
  Administered 2020-06-09: 10 mL
  Filled 2020-06-09: qty 10

## 2020-06-09 MED ORDER — METHYLPREDNISOLONE SODIUM SUCC 125 MG IJ SOLR
80.0000 mg | Freq: Every day | INTRAMUSCULAR | Status: DC
  Administered 2020-06-09: 80 mg via INTRAVENOUS

## 2020-06-09 MED ORDER — ACETAMINOPHEN 325 MG PO TABS
ORAL_TABLET | ORAL | Status: AC
  Filled 2020-06-09: qty 2

## 2020-06-09 MED ORDER — FAMOTIDINE IN NACL 20-0.9 MG/50ML-% IV SOLN
INTRAVENOUS | Status: AC
  Filled 2020-06-09: qty 50

## 2020-06-09 MED ORDER — ACETAMINOPHEN 325 MG PO TABS
650.0000 mg | ORAL_TABLET | Freq: Once | ORAL | Status: AC
  Administered 2020-06-09: 650 mg via ORAL

## 2020-06-09 MED ORDER — DIPHENHYDRAMINE HCL 25 MG PO CAPS
ORAL_CAPSULE | ORAL | Status: AC
  Filled 2020-06-09: qty 2

## 2020-06-09 MED ORDER — MONTELUKAST SODIUM 10 MG PO TABS
10.0000 mg | ORAL_TABLET | Freq: Once | ORAL | Status: AC
  Administered 2020-06-09: 10 mg via ORAL

## 2020-06-09 MED ORDER — MONTELUKAST SODIUM 10 MG PO TABS
ORAL_TABLET | ORAL | Status: AC
  Filled 2020-06-09: qty 1

## 2020-06-09 MED ORDER — FAMOTIDINE IN NACL 20-0.9 MG/50ML-% IV SOLN
20.0000 mg | Freq: Once | INTRAVENOUS | Status: AC
  Administered 2020-06-09: 20 mg via INTRAVENOUS

## 2020-06-09 NOTE — Patient Instructions (Signed)
[facility] Discharge Instructions for Patients Receiving Chemotherapy  Today you received the following chemotherapy agents:  Rituxan.  To help prevent nausea and vomiting after your treatment, we encourage you to take your nausea medication as directed.   If you develop nausea and vomiting that is not controlled by your nausea medication, call the clinic.   BELOW ARE SYMPTOMS THAT SHOULD BE REPORTED IMMEDIATELY:  *FEVER GREATER THAN 100.5 F  *CHILLS WITH OR WITHOUT FEVER  NAUSEA AND VOMITING THAT IS NOT CONTROLLED WITH YOUR NAUSEA MEDICATION  *UNUSUAL SHORTNESS OF BREATH  *UNUSUAL BRUISING OR BLEEDING  TENDERNESS IN MOUTH AND THROAT WITH OR WITHOUT PRESENCE OF ULCERS  *URINARY PROBLEMS  *BOWEL PROBLEMS  UNUSUAL RASH Items with * indicate a potential emergency and should be followed up as soon as possible.  Feel free to call the clinic should you have any questions or concerns. The clinic phone number is [phone].  Please show the CHEMO ALERT CARD at check-in to the Emergency Department and triage nurse.   

## 2020-06-10 ENCOUNTER — Other Ambulatory Visit: Payer: Self-pay | Admitting: Oncology

## 2020-06-10 ENCOUNTER — Encounter: Payer: Self-pay | Admitting: Hematology

## 2020-06-10 DIAGNOSIS — C8598 Non-Hodgkin lymphoma, unspecified, lymph nodes of multiple sites: Secondary | ICD-10-CM

## 2020-06-10 MED ORDER — LORAZEPAM 0.5 MG PO TABS: 0.5000 mg | ORAL_TABLET | Freq: Three times a day (TID) | ORAL | 0 refills | Status: DC | PRN

## 2020-06-10 NOTE — Telephone Encounter (Signed)
Please review for refill. Last filled on 06/06/20

## 2020-06-12 ENCOUNTER — Other Ambulatory Visit: Payer: Self-pay | Admitting: *Deleted

## 2020-06-12 DIAGNOSIS — C8338 Diffuse large B-cell lymphoma, lymph nodes of multiple sites: Secondary | ICD-10-CM

## 2020-06-13 ENCOUNTER — Inpatient Hospital Stay: Payer: 59

## 2020-06-13 ENCOUNTER — Inpatient Hospital Stay (HOSPITAL_BASED_OUTPATIENT_CLINIC_OR_DEPARTMENT_OTHER): Payer: 59 | Admitting: Hematology

## 2020-06-13 ENCOUNTER — Other Ambulatory Visit: Payer: Self-pay

## 2020-06-13 VITALS — BP 96/64 | HR 66 | Temp 97.9°F | Resp 14 | Ht 67.0 in | Wt 138.3 lb

## 2020-06-13 DIAGNOSIS — C8338 Diffuse large B-cell lymphoma, lymph nodes of multiple sites: Secondary | ICD-10-CM | POA: Diagnosis not present

## 2020-06-13 DIAGNOSIS — Z95828 Presence of other vascular implants and grafts: Secondary | ICD-10-CM

## 2020-06-13 DIAGNOSIS — Z23 Encounter for immunization: Secondary | ICD-10-CM | POA: Diagnosis not present

## 2020-06-13 DIAGNOSIS — Z5111 Encounter for antineoplastic chemotherapy: Secondary | ICD-10-CM

## 2020-06-13 DIAGNOSIS — Z5112 Encounter for antineoplastic immunotherapy: Secondary | ICD-10-CM | POA: Diagnosis not present

## 2020-06-13 LAB — CBC WITH DIFFERENTIAL (CANCER CENTER ONLY)
Abs Immature Granulocytes: 0.31 10*3/uL — ABNORMAL HIGH (ref 0.00–0.07)
Basophils Absolute: 0.2 10*3/uL — ABNORMAL HIGH (ref 0.0–0.1)
Basophils Relative: 3 %
Eosinophils Absolute: 0.5 10*3/uL (ref 0.0–0.5)
Eosinophils Relative: 6 %
HCT: 27.6 % — ABNORMAL LOW (ref 39.0–52.0)
Hemoglobin: 9.5 g/dL — ABNORMAL LOW (ref 13.0–17.0)
Immature Granulocytes: 4 %
Lymphocytes Relative: 9 %
Lymphs Abs: 0.7 10*3/uL (ref 0.7–4.0)
MCH: 30.1 pg (ref 26.0–34.0)
MCHC: 34.4 g/dL (ref 30.0–36.0)
MCV: 87.3 fL (ref 80.0–100.0)
Monocytes Absolute: 1.2 10*3/uL — ABNORMAL HIGH (ref 0.1–1.0)
Monocytes Relative: 16 %
Neutro Abs: 4.5 10*3/uL (ref 1.7–7.7)
Neutrophils Relative %: 62 %
Platelet Count: 187 10*3/uL (ref 150–400)
RBC: 3.16 MIL/uL — ABNORMAL LOW (ref 4.22–5.81)
RDW: 14.1 % (ref 11.5–15.5)
WBC Count: 7.3 10*3/uL (ref 4.0–10.5)
nRBC: 0 % (ref 0.0–0.2)

## 2020-06-13 LAB — CMP (CANCER CENTER ONLY)
ALT: 40 U/L (ref 0–44)
AST: 19 U/L (ref 15–41)
Albumin: 3.5 g/dL (ref 3.5–5.0)
Alkaline Phosphatase: 111 U/L (ref 38–126)
Anion gap: 7 (ref 5–15)
BUN: 10 mg/dL (ref 8–23)
CO2: 23 mmol/L (ref 22–32)
Calcium: 8.8 mg/dL — ABNORMAL LOW (ref 8.9–10.3)
Chloride: 104 mmol/L (ref 98–111)
Creatinine: 0.66 mg/dL (ref 0.61–1.24)
GFR, Estimated: >60 mL/min (ref >60)
Glucose, Bld: 92 mg/dL (ref 70–99)
Potassium: 4.2 mmol/L (ref 3.5–5.1)
Sodium: 134 mmol/L — ABNORMAL LOW (ref 135–145)
Total Bilirubin: 0.4 mg/dL (ref 0.3–1.2)
Total Protein: 6.1 g/dL — ABNORMAL LOW (ref 6.5–8.1)

## 2020-06-13 LAB — LACTATE DEHYDROGENASE: LDH: 189 U/L (ref 98–192)

## 2020-06-13 LAB — URIC ACID: Uric Acid, Serum: 6.8 mg/dL (ref 3.7–8.6)

## 2020-06-13 MED ORDER — HEPARIN SOD (PORK) LOCK FLUSH 100 UNIT/ML IV SOLN
500.0000 [IU] | Freq: Once | INTRAVENOUS | Status: AC
  Administered 2020-06-13: 500 [IU] via INTRAVENOUS
  Filled 2020-06-13: qty 5

## 2020-06-13 MED ORDER — SODIUM CHLORIDE 0.9% FLUSH
10.0000 mL | INTRAVENOUS | Status: DC | PRN
  Administered 2020-06-13: 10 mL via INTRAVENOUS
  Filled 2020-06-13: qty 10

## 2020-06-13 NOTE — Progress Notes (Signed)
HEMATOLOGY/ONCOLOGY CONSULTATION NOTE  Date of Service: 06/13/2020  Patient Care Team: System, Provider Not In as PCP - General  CHIEF COMPLAINTS/PURPOSE OF CONSULTATION:  mx of large B cell lymphoma  HISTORY OF PRESENTING ILLNESS:  Bryan [name] is a wonderful 65 y.o. male who has been referred to [place] by Dr. Tammi Klippel for evaluation and management of lung mass. Pt is accompanied today by his wife. The pt reports that he is doing well overall.   The pt reports that he was diagnosed with low-grade Prostate Cancer in 2013. Pt had no symptoms, but got a check up after his father was diagnosed with Prostate Cancer. He was only treated with cryoablation. He also has a reducible, right inguinal hernia. Pt denies any other medical conditions or chronic medications. He does take a daily fish oil and multivitamin. He has NKDA.   About 6 months ago pt began having abdominal pain after eating. He saw several physicians who could not explain his symptoms. Last month he began having extreme discomfort in his chest and red stools. He is unsure if the color of his stools was from blood or diet. Pt then began experiencing left arm pain and pleuritic chest pain, which triggered his Chest CT in October.  In the last few nights pt has had roaming headaches that are improved with OTC Tylenol. Pt has lost 4-5 pounds recently, but attributes this to retiring and running more. Pt is currently taking two Oxycodone twice per day, which is controlling his discomfort. His wife also notes that the pt appears more fatigued than usual and is napping more. Pt felt poorly for 1 week after his second COVID19 vaccine, which he recived in March. Pt received the [place] vaccine, but waiting to receive the booster.   Of note prior to the patient's visit today, pt has had PET/CT ([id number]) completed on 04/08/2020 with results revealing "1. Intensely hypermetabolic bilateral neck, bilateral axillary, bilateral mediastinal,  bilateral hilar and retroperitoneal lymphadenopathy. Numerous small hypermetabolic splenic masses. Findings are compatible with malignancy, favoring lymphoproliferative disorder. The most accessible pathologic lymph nodes for potential percutaneous biopsy are likely within the left axilla and left supraclavicular neck."   Most recent lab results (03/31/2020) of CBC is as follows: all values are WNL except for HCT at 41.2, MPV at 9.7, Neutro Rel at 70.3, Immature Gran Rel at 0.4, Lymphs Rel at 15.8, Mono Rel at 10.3. 03/31/2020 D-Dimer at 0.51  On review of systems, pt reports headaches, chest pain, improved diarrhea, fatigue, mild productive cough, abdominal fullness and denies fevers, chills, night sweats, unexpected weight loss, low appetite, SOB, testicular pain/swelling, leg swelling, rash and any other symptoms.   On PMHx the pt reports Prostate Cancer, Cryotherapy, Inguinal hernia. 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 On Family Hx the pt reports a father with Prostate Cancer and a mother with Esophageal and Bladder Cancer (was a smoker).   INTERVAL HISTORY: Bryan [name] is a wonderful 65 y.o. male who is here for evaluation and management of newly diagnosed Large B-cell lymphoma. We are joined today by his wife via phone. The patient's last visit with [place] was on 05/26/2020. The pt reports that he is doing well overall.  The pt reports that he has been doing better than prior cycles. He has not experienced any new symptoms and has been eating much better. He notes that his sister moved to take care of him lately and this has been beneficial to  his progression and improvement.  The pt completed his immunotherapy on Monday and has had no problems. Pt has been experiencing less bone pain with each subsequent bone marrow stimulating shot. He notes that he has not gotten his COVID booster, but is desiring to get this ASAP.  Lab results today (06/13/20) of CBC w/diff and  CMP is as follows: all values are WNL except for RBC at 3.16, Hgb at 9.5, HCT at 27.6, Sodium at 134, Calcium at 8.8, Tot Pro at 6.1. 06/13/2020 LDH at 189 06/13/2020 Uric Acid at 6.8  On review of systems, pt reports less bone pain and denies any other symptoms.   MEDICAL HISTORY:  Past Medical History:  Diagnosis Date  . Prostate cancer (HCC)     SURGICAL HISTORY: Past Surgical History:  Procedure Laterality Date  . CRYOTHERAPY    . IR IMAGING GUIDED PORT INSERTION  05/08/2020  . PROSTATE SURGERY      SOCIAL HISTORY: Social History   Socioeconomic History  . Marital status: Married    Spouse name: 1  . Number of children: Not on file  . Years of education: Not on file  . Highest education level: Not on file  Occupational History  . Occupation: Pilot    Employer: [employer]    Comment: medical leave  Tobacco Use  . Smoking status: Never Smoker  . Smokeless tobacco: Never Used  Vaping Use  . Vaping Use: Never used  Substance and Sexual Activity  . Alcohol use: Not Currently  . Drug use: Never  . Sexual activity: Yes  Other Topics Concern  . Not on file  Social History Narrative  . Not on file   Social Determinants of Health   Financial Resource Strain: Not on file  Food Insecurity: Not on file  Transportation Needs: Not on file  Physical Activity: Not on file  Stress: Not on file  Social Connections: Not on file  Intimate Partner Violence: Not on file    FAMILY HISTORY: Family History  Problem Relation Age of Onset  . Bladder Cancer Mother   . Esophageal cancer Mother   . Prostate cancer Father   . Prostate cancer Paternal Uncle     ALLERGIES:  has No Known Allergies.  MEDICATIONS:  Current Outpatient Medications  Medication Sig Dispense Refill  . acetaminophen (TYLENOL) 325 MG tablet Take 2 tablets (650 mg total) by mouth every 4 (four) hours as needed for mild pain.    . Acetaminophen-Codeine 300-30 MG tablet TAKE 1 TABLET BY MOUTH EVERY 4  (FOUR) HOURS AS NEEDED FOR MODERATE PAIN 30 tablet 0  . dexamethasone (DECADRON) 4 MG tablet Take 1 tablet twice a day for 3 days starting the day after each cycle of chemo (Patient taking differently: Take 4 mg by mouth See admin instructions. Take 1 tablet twice a day for 3 days starting the day after each cycle of chemo) 20 tablet 1  . dronabinol (MARINOL) 5 MG capsule Take 1 capsule (5 mg total) by mouth 2 (two) times daily before lunch and supper. 60 capsule 0  . lidocaine-prilocaine (EMLA) cream Apply 1 application topically as needed. Apply to port site 30 g 0  . LORazepam (ATIVAN) 0.5 MG tablet Take 1 tablet (0.5 mg total) by mouth every 8 (eight) hours as needed for anxiety. 30 tablet 0  . ondansetron (ZOFRAN) 8 MG tablet TAKE 1 TABLET BY MOUTH EVERY 8 HOURS AS NEEDED FOR NAUSEA OR VOMITING. (Patient not taking: Reported on 06/02/2020) 30 tablet   0  . pegfilgrastim-bmez (ZIEXTENZO) 6 MG/0.6ML injection Inject 0.6 mLs (6 mg total) into the skin See admin instructions. Inject 0.6 mLs (6 mg total) into the skin once for 1 dose. 24 hours after each cycle of EPOCH. To be administered by Home Health RN arranged by [facility] 6 mg into the skin See admin instructions. 0.6 mL 4  . polyethylene glycol (MIRALAX / GLYCOLAX) 17 g packet Take 17 g by mouth daily as needed. (Patient taking differently: Take 17 g by mouth daily.) 14 each 0  . prochlorperazine (COMPAZINE) 10 MG tablet TAKE 1 TABLET BY MOUTH EVERY 6 HOURS AS NEEDED FOR NAUSEA OR VOMITING. 30 tablet 2  . senna-docusate (SENOKOT-S) 8.6-50 MG tablet Take 1 tablet by mouth 2 (two) times daily as needed for mild constipation. (Patient not taking: Reported on 06/02/2020)    . Sodium Chloride-Sodium Bicarb (SODIUM BICARBONATE/SODIUM CHLORIDE) SOLN 1 application by Mouth Rinse route 4 (four) times daily. (Patient not taking: Reported on 06/02/2020)     No current facility-administered medications for this visit.    REVIEW OF SYSTEMS:   A 10+  POINT REVIEW OF SYSTEMS WAS OBTAINED including neurology, dermatology, psychiatry, cardiac, respiratory, lymph, extremities, GI, GU, Musculoskeletal, constitutional, breasts, reproductive, HEENT.  All pertinent positives are noted in the HPI.  All others are negative.   PHYSICAL EXAMINATION: ECOG PERFORMANCE STATUS: 1 - Symptomatic but completely ambulatory  . Vitals:   06/13/20 1025  BP: 96/64  Pulse: 66  Resp: 14  Temp: 97.9 F (36.6 C)  SpO2: 100%   Filed Weights   06/13/20 1025  Weight: 138 lb 4.8 oz (62.7 kg)   .Body mass index is 21.66 kg/m.   GENERAL:alert, in no acute distress and comfortable SKIN: no acute rashes, no significant lesions EYES: conjunctiva are pink and non-injected, sclera anicteric OROPHARYNX: MMM, no exudates, no oropharyngeal erythema or ulceration NECK: supple, no JVD LYMPH:  no palpable lymphadenopathy in the cervical, axillary or inguinal regions LUNGS: clear to auscultation b/l with normal respiratory effort HEART: regular rate & rhythm ABDOMEN:  normoactive bowel sounds , non tender, not distended. No palpable hepatosplenomegaly.  Extremity: no pedal edema PSYCH: alert & oriented x 3 with fluent speech NEURO: no focal motor/sensory deficits  LABORATORY DATA:  I have reviewed the data as listed  . CBC Latest Ref Rng & Units 06/02/2020 05/26/2020 05/16/2020  WBC 4.0 - 10.5 K/uL 20.0(H) 50.6(HH) 9.2  Hemoglobin 13.0 - 17.0 g/dL 10.5(L) 10.0(L) 10.9(L)  Hematocrit 39.0 - 52.0 % 31.6(L) 29.9(L) 30.3(L)  Platelets 150 - 400 K/uL 250 204 373   . CMP Latest Ref Rng & Units 06/03/2020 06/02/2020 05/26/2020  Glucose 70 - 99 mg/dL 134(H) 105(H) 103(H)  BUN 8 - 23 mg/dL _0 Creatinine 0.61 - 1.24 mg/dL 0.60(L) 0.56(L) 0.79  Sodium 135 - 145 mmol/L 138 140 142  Potassium 3.5 - 5.1 mmol/L 4.3 3.9 4.0  Chloride 98 - 111 mmol/L 109 108 106  CO2 22 - 32 mmol/L 21(L) 26 26  Calcium 8.9 - 10.3 mg/dL 8.6(L) 8.9 8.6(L)  Total Protein 6.5 - 8.1  g/dL 6.0(L) 6.0(L) 5.9(L)  Total Bilirubin 0.3 - 1.2 mg/dL 0.4 0.2(L) <0.2(L)  Alkaline Phos 38 - 126 U/L 92 103 112  AST 15 - 41 U/L 19 20 34  ALT 0 - 44 U/L 32 35 72(H)   04/10/2020 FISH Analysis ([id number]):   04/10/2020 Left Cervical Lymph Node Bx ([id number]):    RADIOGRAPHIC STUDIES: I have personally reviewed the radiological  images as listed and agreed with the findings in the report. NM PET Image Restag (PS) Skull Base To Thigh  Result Date: 05/28/2020 CLINICAL DATA:  Subsequent treatment strategy for diffuse large B-cell lymphoma. EXAM: NUCLEAR MEDICINE PET SKULL BASE TO THIGH TECHNIQUE: 7.35 mCi F-18 FDG was injected intravenously. Full-ring PET imaging was performed from the skull base to thigh after the radiotracer. CT data was obtained and used for attenuation correction and anatomic localization. Fasting blood glucose: 85 mg/dl COMPARISON:  04/08/2020 FINDINGS: Mediastinal blood pool activity: SUV max 1.66 Liver activity: SUV max 2.74 NECK: Marked interval decrease in the metabolism associated with lymph nodes in the neck. Level 2 lymph node on the RIGHT previously 0.9 cm now approximately 3 mm and without evidence of significant FDG uptake. Representative LEFT level 4 lymph nodes seen on the prior study previously with a maximum SUV of 22 shows mild enlargement on today's study at 1 cm as compared to 1.8 cm (image 47, series 4) current maximum SUV of 1.7. RIGHT thoracic inlet lymph node measuring 2.2 cm on today's study shown to be necrotic with some peripheral activity on the prior exam previously at 3.1 cm. Mild uptake at the periphery with a maximum SUV of 4.2. Symmetric bilateral presumed physiologic parotid uptake Incidental CT findings: none CHEST: Bulky mediastinal and hilar lymph nodes seen on the previous exam have largely resolved. Thoracic inlet lymph node beneath the sternum (image 56, series 4) 9 mm short axis, previously 11 mm short axis maximum SUV of 2.0 as compared  to 21 Subcarinal nodal tissue (image 78 of series 4) measuring 2 cm short axis, previously 2.8 cm short axis with a maximum SUV of 4.3 as compared to 22. RIGHT hilar lymph node (image 82, series 4) 18 mm as compared to 22 mm SUV at 2.5 as compared to 25 Soft tissue along the aorta corresponding to LEFT infrahilar nodal tissue measuring 1 cm short axis dimension on image 89 series 4 previously 2.8 cm maximum SUV of 2.9 as compared to as much as 24 on the prior study Resolution of activity associated with a mildly enlarged LEFT axillary lymph node that was seen on prior study previously with activity up to a maximum SUV of 21. Subcentimeter lymph node LEFT axilla on image 61 of series 4 with a SUV of 1.2 on the current study Incidental CT findings: Scattered aortic atherosclerotic changes. RIGHT-sided Port-A-Cath terminates in the RIGHT atrium. Heart size is normal without pericardial effusion. Aortic caliber is. Central pulmonary vascular caliber normal. Limited assessment of cardiovascular structures given lack of intravenous contrast. Minimal basilar atelectasis. Airways are patent. ABDOMEN/PELVIS: Focal areas of uptake in the spleen resolved. Splenic activity remains slightly above background liver activity. Splenic size is stable, unenlarged. No hypermetabolic lymph nodes in the abdomen or pelvis. Increased metabolism within lymph nodes in the upper abdomen seen on the previous study has resolved. There is decreased size of a hepatogastric lymph node, now measuring 1.4 cm as compared to 2.8 cm. (Image 113, series 4) max SUV of 2.5. Incidental CT findings: Liver, pancreas gallbladder, adrenal glands, kidneys stomach, small large bowel without acute process. Stool throughout the colon. Calcified atheromatous plaque in the abdominal aorta. SKELETON: Diffuse increased metabolism throughout marrow spaces of the spine and pelvis in particular. No focal uptake. Incidental CT findings: none IMPRESSION: 1. Marked interval  response to therapy with resolution of nearly all areas of increased FDG uptake compared to the previous study. Most areas with residual uptake Deauville category   3. Subcarinal and RIGHT thoracic inlet with uptake slightly greater than liver, technically Deauville category 4 as described. Maximum SUV on today's study is 4.3 in the subcarinal region. 2. No new signs of disease. 3. Diffuse marrow activity presumably related to marrow stimulation. Suggest attention on follow-up. Aortic Atherosclerosis (ICD10-I70.0). Electronically Signed   By: Geoffrey  Wile M.D.   On: 05/28/2020 16:42    ASSESSMENT & PLAN:   1) Recently diagnosed advanced stage IIIA Large B cell lymphoma - activated B cell type 04/08/2020 PET/CT ([id number]) revealed "1. Intensely hypermetabolic bilateral neck, bilateral axillary, bilateral mediastinal, bilateral hilar and retroperitoneal lymphadenopathy. Numerous small hypermetabolic splenic masses. Findings are compatible with malignancy, favoring lymphoproliferative disorder." 04/10/2020 Left Cervical Lymph Node Bx ([id number]) revealed "Diffuse large B-cell lymphoma. KI-67 is 80-90%" FISH panel neg for double/triplet hit lymphoma PET/CT 12/22 "1. Marked interval response to therapy with resolution of nearly all areas of increased FDG uptake compared to the previous study. Most areas with residual uptake Deauville category 3. Subcarinal and RIGHT thoracic inlet with uptake slightly greater than liver, technically Deauville category 4 as described. Maximum SUV on today's study is 4.3 in the subcarinal region. 2. No new signs of disease. 3. Diffuse marrow activity presumably related to marrow stimulation. Suggest attention on follow-up. Aortic Atherosclerosis (ICD10-I70.0)."   PLAN: -Discussed pt labwork today, 06/13/20; mild anemia, PLT bounce back, and other blood counts look good, LDH and Uric acid WNL. -The pt has no prohibitive toxicities from continuing C4 EPOCH-R. Plan to  continue through six cycles. -Plan to begin Intrathecal Methotrexate for CNS prophylaxis on C4D2. -Reviewed prior PET scans. -Will get COVID booster today. -Continue Marinol 5 mg BID -Will see back on C4D1.   FOLLOW UP: Covid booster injection today Inpatient admission for C4 of EPOCH for 5 days from 06/23/2020 Outpatient Rituxan on 06/30/20 Outpatient f/u with Dr  with portflush and labs in 3 weeks   The total time spent in the appt was 30 minutes and more than 50% was on counseling and direct patient cares, ordering, management and co-ordination of chemotherapy  All of the patient's questions were answered with apparent satisfaction. The patient knows to call the clinic with any problems, questions or concerns.      MD MS AAHIVMS SCH CTH Hematology/Oncology Physician [facility]  (Office):       [phone] (Work cell):  [phone] (Fax):           [phone]  06/13/2020 7:16 AM  I, Jazzmine Knight, am acting as a scribe for Dr.  .   .I have reviewed the above documentation for accuracy and completeness, and I agree with the above. . Kishore  MD        

## 2020-06-13 NOTE — Progress Notes (Signed)
   Covid-19 Vaccination Clinic  Name:  Bryan [name]    MRN: [id number] DOB: 02/04/1956  06/13/2020  Bryan [name] was observed post Covid-19 immunization for 15 minutes without incident. He was provided with Vaccine Information Sheet and instruction to access the V-Safe system.   Bryan [name] was instructed to call 911 with any severe reactions post vaccine: [name] Difficulty breathing  . Swelling of face and throat  . A fast heartbeat  . A bad rash all over body  . Dizziness and weakness   Immunizations Administered    Name Date Dose VIS Date [place] COVID-19 Vaccine 06/13/2020 11:20 AM 0.3 mL 03/26/2020 Intramuscular   Manufacturer: [place]   Lot: [id number]   [place]: [id number]

## 2020-06-16 ENCOUNTER — Other Ambulatory Visit: Payer: Self-pay | Admitting: Oncology

## 2020-06-16 ENCOUNTER — Encounter: Payer: Self-pay | Admitting: Hematology

## 2020-06-16 DIAGNOSIS — C8598 Non-Hodgkin lymphoma, unspecified, lymph nodes of multiple sites: Secondary | ICD-10-CM

## 2020-06-19 ENCOUNTER — Telehealth: Payer: Self-pay | Admitting: *Deleted

## 2020-06-19 ENCOUNTER — Other Ambulatory Visit: Payer: Self-pay | Admitting: Hematology

## 2020-06-19 NOTE — Telephone Encounter (Signed)
Please review for refill.  

## 2020-06-19 NOTE — Telephone Encounter (Signed)
Error

## 2020-06-19 NOTE — Telephone Encounter (Signed)
  Scheduled for Monday 12/6 AM admissionper Sean in patient placement. Covid screen is scheduled for Saturday 06/23/20 Email sent to #[email or web address] Patient/wifeinformed of all times and locations via MyChart

## 2020-06-20 ENCOUNTER — Telehealth: Payer: Self-pay | Admitting: Hematology

## 2020-06-20 NOTE — Telephone Encounter (Signed)
Scheduled per 01/07 los, patient has been called and notified.  

## 2020-06-21 ENCOUNTER — Other Ambulatory Visit (HOSPITAL_COMMUNITY)
Admission: RE | Admit: 2020-06-21 | Discharge: 2020-06-21 | Disposition: A | Discharge status: Home / Self Care | Payer: 59 | Source: Ambulatory Visit | Attending: Hematology | Admitting: Hematology

## 2020-06-21 DIAGNOSIS — Z01812 Encounter for preprocedural laboratory examination: Secondary | ICD-10-CM | POA: Insufficient documentation

## 2020-06-21 DIAGNOSIS — Z20822 Contact with and (suspected) exposure to covid-19: Secondary | ICD-10-CM | POA: Insufficient documentation

## 2020-06-21 LAB — SARS CORONAVIRUS 2 (TAT 6-24 HRS): SARS Coronavirus 2: NEGATIVE

## 2020-06-23 ENCOUNTER — Inpatient Hospital Stay (HOSPITAL_COMMUNITY)
Admission: RE | Admit: 2020-06-23 | Discharge: 2020-06-27 | DRG: 847 | Disposition: A | Discharge status: Home / Self Care | Payer: 59 | Source: Ambulatory Visit | Attending: Hematology | Admitting: Hematology

## 2020-06-23 ENCOUNTER — Other Ambulatory Visit: Payer: Self-pay

## 2020-06-23 ENCOUNTER — Encounter (HOSPITAL_COMMUNITY): Payer: Self-pay | Admitting: Hematology

## 2020-06-23 DIAGNOSIS — Z8042 Family history of malignant neoplasm of prostate: Secondary | ICD-10-CM | POA: Diagnosis not present

## 2020-06-23 DIAGNOSIS — T380X5A Adverse effect of glucocorticoids and synthetic analogues, initial encounter: Secondary | ICD-10-CM | POA: Diagnosis present

## 2020-06-23 DIAGNOSIS — Z8546 Personal history of malignant neoplasm of prostate: Secondary | ICD-10-CM

## 2020-06-23 DIAGNOSIS — D649 Anemia, unspecified: Secondary | ICD-10-CM | POA: Diagnosis not present

## 2020-06-23 DIAGNOSIS — K5900 Constipation, unspecified: Secondary | ICD-10-CM | POA: Diagnosis present

## 2020-06-23 DIAGNOSIS — Z20822 Contact with and (suspected) exposure to covid-19: Secondary | ICD-10-CM | POA: Diagnosis present

## 2020-06-23 DIAGNOSIS — C8338 Diffuse large B-cell lymphoma, lymph nodes of multiple sites: Secondary | ICD-10-CM | POA: Diagnosis present

## 2020-06-23 DIAGNOSIS — Z5111 Encounter for antineoplastic chemotherapy: Principal | ICD-10-CM

## 2020-06-23 DIAGNOSIS — D72829 Elevated white blood cell count, unspecified: Secondary | ICD-10-CM | POA: Diagnosis present

## 2020-06-23 DIAGNOSIS — Z7189 Other specified counseling: Secondary | ICD-10-CM

## 2020-06-23 DIAGNOSIS — C8330 Diffuse large B-cell lymphoma, unspecified site: Secondary | ICD-10-CM | POA: Diagnosis not present

## 2020-06-23 DIAGNOSIS — R5383 Other fatigue: Secondary | ICD-10-CM | POA: Diagnosis present

## 2020-06-23 DIAGNOSIS — T451X5A Adverse effect of antineoplastic and immunosuppressive drugs, initial encounter: Secondary | ICD-10-CM | POA: Diagnosis present

## 2020-06-23 DIAGNOSIS — Z79899 Other long term (current) drug therapy: Secondary | ICD-10-CM

## 2020-06-23 DIAGNOSIS — D6481 Anemia due to antineoplastic chemotherapy: Secondary | ICD-10-CM | POA: Diagnosis present

## 2020-06-23 DIAGNOSIS — R110 Nausea: Secondary | ICD-10-CM | POA: Diagnosis present

## 2020-06-23 DIAGNOSIS — C8580 Other specified types of non-Hodgkin lymphoma, unspecified site: Secondary | ICD-10-CM

## 2020-06-23 DIAGNOSIS — C8598 Non-Hodgkin lymphoma, unspecified, lymph nodes of multiple sites: Secondary | ICD-10-CM

## 2020-06-23 LAB — CBC WITH DIFFERENTIAL/PLATELET
Abs Immature Granulocytes: 1.3 10*3/uL — ABNORMAL HIGH (ref 0.00–0.07)
Band Neutrophils: 12 %
Basophils Absolute: 0.2 10*3/uL — ABNORMAL HIGH (ref 0.0–0.1)
Basophils Relative: 1 %
Eosinophils Absolute: 0.2 10*3/uL (ref 0.0–0.5)
Eosinophils Relative: 1 %
HCT: 30.3 % — ABNORMAL LOW (ref 39.0–52.0)
Hemoglobin: 10 g/dL — ABNORMAL LOW (ref 13.0–17.0)
Lymphocytes Relative: 6 %
Lymphs Abs: 1.3 10*3/uL (ref 0.7–4.0)
MCH: 30.3 pg (ref 26.0–34.0)
MCHC: 33 g/dL (ref 30.0–36.0)
MCV: 91.8 fL (ref 80.0–100.0)
Metamyelocytes Relative: 1 %
Monocytes Absolute: 0.9 10*3/uL (ref 0.1–1.0)
Monocytes Relative: 4 %
Myelocytes: 5 %
Neutro Abs: 18.4 10*3/uL — ABNORMAL HIGH (ref 1.7–7.7)
Neutrophils Relative %: 70 %
Platelets: 337 10*3/uL (ref 150–400)
RBC: 3.3 MIL/uL — ABNORMAL LOW (ref 4.22–5.81)
RDW: 16.6 % — ABNORMAL HIGH (ref 11.5–15.5)
WBC: 22.4 10*3/uL — ABNORMAL HIGH (ref 4.0–10.5)
nRBC: 0 % (ref 0.0–0.2)

## 2020-06-23 LAB — COMPREHENSIVE METABOLIC PANEL
ALT: 24 U/L (ref 0–44)
AST: 20 U/L (ref 15–41)
Albumin: 3.7 g/dL (ref 3.5–5.0)
Alkaline Phosphatase: 92 U/L (ref 38–126)
Anion gap: 12 (ref 5–15)
BUN: 11 mg/dL (ref 8–23)
CO2: 22 mmol/L (ref 22–32)
Calcium: 9.1 mg/dL (ref 8.9–10.3)
Chloride: 105 mmol/L (ref 98–111)
Creatinine, Ser: 0.75 mg/dL (ref 0.61–1.24)
GFR, Estimated: >60 mL/min (ref >60)
Glucose, Bld: 105 mg/dL — ABNORMAL HIGH (ref 70–99)
Potassium: 4 mmol/L (ref 3.5–5.1)
Sodium: 139 mmol/L (ref 135–145)
Total Bilirubin: 0.4 mg/dL (ref 0.3–1.2)
Total Protein: 6.2 g/dL — ABNORMAL LOW (ref 6.5–8.1)

## 2020-06-23 MED ORDER — VINCRISTINE SULFATE CHEMO INJECTION 1 MG/ML
Freq: Once | INTRAVENOUS | Status: AC
  Filled 2020-06-23: qty 10

## 2020-06-23 MED ORDER — POLYETHYLENE GLYCOL 3350 17 G PO PACK
17.0000 g | PACK | Freq: Every day | ORAL | Status: DC
  Administered 2020-06-24 – 2020-06-26 (×3): 17 g via ORAL
  Filled 2020-06-23 (×4): qty 1

## 2020-06-23 MED ORDER — SODIUM CHLORIDE 0.9 % IV SOLN: INTRAVENOUS | Status: DC

## 2020-06-23 MED ORDER — SODIUM BICARBONATE/SODIUM CHLORIDE MOUTHWASH
1.0000 "application " | Freq: Four times a day (QID) | OROMUCOSAL | Status: DC
  Administered 2020-06-23 – 2020-06-26 (×10): 1 via OROMUCOSAL
  Filled 2020-06-23: qty 1000

## 2020-06-23 MED ORDER — COLD PACK MISC ONCOLOGY
1.0000 | Freq: Once | Status: DC | PRN
  Filled 2020-06-23: qty 1

## 2020-06-23 MED ORDER — LIDOCAINE-PRILOCAINE 2.5-2.5 % EX CREA
1.0000 "application " | TOPICAL_CREAM | CUTANEOUS | Status: DC | PRN
  Filled 2020-06-23: qty 5

## 2020-06-23 MED ORDER — PREDNISONE 20 MG PO TABS
60.0000 mg | ORAL_TABLET | Freq: Every day | ORAL | Status: AC
  Administered 2020-06-23 – 2020-06-27 (×5): 60 mg via ORAL
  Filled 2020-06-23 (×5): qty 3

## 2020-06-23 MED ORDER — PROCHLORPERAZINE MALEATE 10 MG PO TABS
10.0000 mg | ORAL_TABLET | Freq: Four times a day (QID) | ORAL | Status: DC | PRN
  Administered 2020-06-23 – 2020-06-27 (×7): 10 mg via ORAL
  Filled 2020-06-23 (×7): qty 1

## 2020-06-23 MED ORDER — ACETAMINOPHEN-CODEINE #3 300-30 MG PO TABS
1.0000 | ORAL_TABLET | ORAL | Status: DC | PRN
  Administered 2020-06-23 – 2020-06-27 (×11): 1 via ORAL
  Filled 2020-06-23 (×11): qty 1

## 2020-06-23 MED ORDER — ONDANSETRON HCL 40 MG/20ML IJ SOLN
Freq: Once | INTRAMUSCULAR | Status: AC
  Administered 2020-06-23: 18 mg via INTRAVENOUS
  Filled 2020-06-23: qty 4

## 2020-06-23 MED ORDER — PREDNISONE 20 MG PO TABS: 60.0000 mg | ORAL_TABLET | Freq: Every day | ORAL | Status: DC

## 2020-06-23 MED ORDER — LORAZEPAM 0.5 MG PO TABS
0.5000 mg | ORAL_TABLET | Freq: Three times a day (TID) | ORAL | Status: DC | PRN
  Administered 2020-06-24 – 2020-06-26 (×4): 0.5 mg via ORAL
  Filled 2020-06-23 (×4): qty 1

## 2020-06-23 MED ORDER — HOT PACK MISC ONCOLOGY
1.0000 | Freq: Once | Status: DC | PRN
  Filled 2020-06-23: qty 1

## 2020-06-23 MED ORDER — SENNOSIDES-DOCUSATE SODIUM 8.6-50 MG PO TABS
1.0000 | ORAL_TABLET | Freq: Two times a day (BID) | ORAL | Status: DC
  Administered 2020-06-23 – 2020-06-25 (×5): 1 via ORAL
  Filled 2020-06-23 (×6): qty 1

## 2020-06-23 MED ORDER — SODIUM CHLORIDE 0.9% FLUSH
10.0000 mL | Freq: Two times a day (BID) | INTRAVENOUS | Status: DC
  Administered 2020-06-23 – 2020-06-26 (×6): 10 mL

## 2020-06-23 MED ORDER — ONDANSETRON HCL 8 MG PO TABS
8.0000 mg | ORAL_TABLET | Freq: Three times a day (TID) | ORAL | Status: DC | PRN
  Administered 2020-06-26: 8 mg via ORAL
  Filled 2020-06-23: qty 1

## 2020-06-23 MED ORDER — CHLORHEXIDINE GLUCONATE CLOTH 2 % EX PADS
6.0000 | MEDICATED_PAD | Freq: Every day | CUTANEOUS | Status: DC
  Administered 2020-06-23 – 2020-06-26 (×4): 6 via TOPICAL

## 2020-06-23 MED ORDER — SODIUM CHLORIDE 0.9% FLUSH: 10.0000 mL | INTRAVENOUS | Status: DC | PRN

## 2020-06-23 MED ORDER — DRONABINOL 5 MG PO CAPS
5.0000 mg | ORAL_CAPSULE | Freq: Two times a day (BID) | ORAL | Status: DC
  Administered 2020-06-23 – 2020-06-25 (×5): 5 mg via ORAL
  Filled 2020-06-23 (×7): qty 1

## 2020-06-23 MED ORDER — ACETAMINOPHEN 325 MG PO TABS: 650.0000 mg | ORAL_TABLET | ORAL | Status: DC | PRN

## 2020-06-23 NOTE — H&P (Addendum)
[place]  [phone] Fax:([phone] [place] H&P  Reason for Admission: Cycle #4 Bryan [name]  HPI: Bryan [name] is a wonderful 65 y.o. male who has been referred to [place] by Dr. Tammi [name] for evaluation and management of lung mass.  About 6 months ago pt began having abdominal pain after eating. He saw several physicians who could not explain his symptoms.  He then developed extreme discomfort in his chest and red stools. He is unsure if the color of his stools was from blood or diet. Pt then began experiencing left arm pain and pleuritic chest pain, which triggered his Chest CTA in October.  CTA chest ruled out PE but showed bulky mediastinal and bilateral hilar adenopathy.  He was referred to hematology oncology at [facility] and a PET scan and biopsy were ordered.  There were significant delays in getting these tests completed.  Bryan [name] then establish care here in [place], [place] with hopes of expediting his work-up.  He underwent a PET scan on 04/08/2020 which showed intensely hypermetabolic bilateral neck, bilateral axillary, bilateral mediastinal, bilateral hilar, and retroperitoneal lymphadenopathy, numerous small hypermetabolic splenic masses.  He then had an ultrasound-guided biopsy of a left supraclavicular lymph node which showed diffuse large B-cell lymphoma.  MRI of the brain with and without contrast was performed on 04/18/2020 which showed no acute intracranial process and no evidence for metastatic disease.  It was recommended for the patient to begin systemic chemotherapy with Bryan [name].   The patient is seen today for admission prior to cycle #4 of his chemotherapy.  His wife is at the bedside.  The patient reports that he feels well overall.  He was able to go to the beach between cycles of chemotherapy.  He is not currently having any fevers, chills, headaches, dizziness, chest pain, shortness of breath, cough, abdominal pain,  nausea, vomiting.  Bowels are moving well at this time. Bowels are moving well at this time. The patient is seen today for admission for cycle #4 of his chemotherapy.  Past Medical History:  Diagnosis Date  . Prostate cancer [facility])     Past Surgical History:  Procedure Laterality Date  . CRYOTHERAPY    . IR IMAGING GUIDED PORT INSERTION  05/08/2020  . PROSTATE SURGERY         Family History  Problem Relation Age of Onset  . Bladder Cancer Mother   . Esophageal cancer Mother   . Prostate cancer Father   . Prostate cancer Paternal Uncle   Relation Age of Onset  . Bladder Cancer Mother   . Esophageal cancer Mother   . Prostate cancer Father   . Prostate cancer Paternal Uncle      Socioeconomic History  . Marital status: Married    Spouse name: 1  . Number of children: Not on file  . Years of education: Not on file  . Highest education level: Not on file  Occupational History  . Occupation: Futures trader: [employer]    Comment: medical leave  Tobacco Use  . Smoking status: Never Smoker  . Smokeless tobacco: Never Used  Vaping Use  . Vaping Use: Never used  Substance and Sexual Activity  . Alcohol use: Not Currently  . Drug use: Never  . Sexual activity: Yes  Other Topics Concern  . Not on file  Social History Narrative  . Not on file   Social Determinants of Health   Financial Resource Strain: Not  on file  Food Insecurity: Not on file  Transportation Needs: Not on file  Physical Activity: Not on file  Stress: Not on file  Social Connections: Not on file  Intimate Partner Violence: Not on file  :  Review of Systems: A comprehensive 14 point review of systems was negative except was noted in the HPI.  Exam: BP 92/64 (BP Location: Right Arm)   Pulse 68   Temp (!) 97.4 F (36.3 C) (Oral)   Resp 14   SpO2 98%   General:  well-nourished in no acute distress.   Eyes:  no scleral icterus.   ENT:  There were no oropharyngeal lesions.   Neck was  without thyromegaly.   Lymphatics:  Negative cervical, supraclavicular or axillary adenopathy.   Respiratory: lungs were clear bilaterally without wheezing or crackles.   Cardiovascular:  Regular rate and rhythm, S1/S2, without murmur, rub or gallop.  There was no pedal edema.   GI:  abdomen was soft, flat, nontender, nondistended, without organomegaly.   Musculoskeletal:  no spinal tenderness of palpation of vertebral spine.   Skin exam was without echymosis, petichae.   Neuro exam was nonfocal. Patient was alert and oriented.  Attention was good.   Language was appropriate.  Mood was normal without depression.  Speech was not pressured.  Thought content was not tangential.   CBC    Component Value Date/Time   WBC 7.3 06/13/2020 0958   WBC 20.0 (H) 06/02/2020 1258   RBC 3.16 (L) 06/13/2020 0958   HGB 9.5 (L) 06/13/2020 0958   HCT 27.6 (L) 06/13/2020 0958   PLT 187 06/13/2020 0958   MCV 87.3 06/13/2020 0958   MCH 30.1 06/13/2020 0958   MCHC 34.4 06/13/2020 0958   RDW 14.1 06/13/2020 0958   LYMPHSABS 0.7 06/13/2020 0958   MONOABS 1.2 (H) 06/13/2020 0958   EOSABS 0.5 06/13/2020 0958   BASOSABS 0.2 (H) 06/13/2020 0958    NM PET Image Restag (PS) Skull Base To Thigh  Result Date: 05/28/2020 CLINICAL DATA:  Subsequent treatment strategy for diffuse large B-cell lymphoma. EXAM: NUCLEAR MEDICINE PET SKULL BASE TO THIGH TECHNIQUE: 7.35 mCi F-18 FDG was injected intravenously. Full-ring PET imaging was performed from the skull base to thigh after the radiotracer. CT data was obtained and used for attenuation correction and anatomic localization. Fasting blood glucose: 85 mg/dl COMPARISON:  04/08/2020 FINDINGS: Mediastinal blood pool activity: SUV max 1.66 Liver activity: SUV max 2.74 NECK: Marked interval decrease in the metabolism associated with lymph nodes in the neck. Level 2 lymph node on the RIGHT previously 0.9 cm now approximately 3 mm and without evidence of significant FDG uptake.  Representative LEFT level 4 lymph nodes seen on the prior study previously with a maximum SUV of 22 shows mild enlargement on today's study at 1 cm as compared to 1.8 cm (image 47, series 4) current maximum SUV of 1.7. RIGHT thoracic inlet lymph node measuring 2.2 cm on today's study shown to be necrotic with some peripheral activity on the prior exam previously at 3.1 cm. Mild uptake at the periphery with a maximum SUV of 4.2. Symmetric bilateral presumed physiologic parotid uptake Incidental CT findings: none CHEST: Bulky mediastinal and hilar lymph nodes seen on the previous exam have largely resolved. Thoracic inlet lymph node beneath the sternum (image 56, series 4) 9 mm short axis, previously 11 mm short axis maximum SUV of 2.0 as compared to 21 Subcarinal nodal tissue (image 78 of series 4) measuring 2 cm short axis,  previously 2.8 cm short axis with a maximum SUV of 4.3 as compared to 22. RIGHT hilar lymph node (image 82, series 4) 18 mm as compared to 22 mm SUV at 2.5 as compared to 25 Soft tissue along the aorta corresponding to LEFT infrahilar nodal tissue measuring 1 cm short axis dimension on image 89 series 4 previously 2.8 cm maximum SUV of 2.9 as compared to as much as 24 on the prior study Resolution of activity associated with a mildly enlarged LEFT axillary lymph node that was seen on prior study previously with activity up to a maximum SUV of 21. Subcentimeter lymph node LEFT axilla on image 61 of series 4 with a SUV of 1.2 on the current study Incidental CT findings: Scattered aortic atherosclerotic changes. RIGHT-sided Port-A-Cath terminates in the RIGHT atrium. Heart size is normal without pericardial effusion. Aortic caliber is. Central pulmonary vascular caliber normal. Limited assessment of cardiovascular structures given lack of intravenous contrast. Minimal basilar atelectasis. Airways are patent. ABDOMEN/PELVIS: Focal areas of uptake in the spleen resolved. Splenic activity remains  slightly above background liver activity. Splenic size is stable, unenlarged. No hypermetabolic lymph nodes in the abdomen or pelvis. Increased metabolism within lymph nodes in the upper abdomen seen on the previous study has resolved. There is decreased size of a hepatogastric lymph node, now measuring 1.4 cm as compared to 2.8 cm. (Image 113, series 4) max SUV of 2.5. Incidental CT findings: Liver, pancreas gallbladder, adrenal glands, kidneys stomach, small large bowel without acute process. Stool throughout the colon. Calcified atheromatous plaque in the abdominal aorta. SKELETON: Diffuse increased metabolism throughout marrow spaces of the spine and pelvis in particular. No focal uptake. Incidental CT findings: none IMPRESSION: 1. Marked interval response to therapy with resolution of nearly all areas of increased FDG uptake compared to the previous study. Most areas with residual uptake Deauville category 3. Subcarinal and RIGHT thoracic inlet with uptake slightly greater than liver, technically Deauville category 4 as described. Maximum SUV on today's study is 4.3 in the subcarinal region. 2. No new signs of disease. 3. Diffuse marrow activity presumably related to marrow stimulation. Suggest attention on follow-up. Aortic Atherosclerosis (ICD10-I70.0). Electronically Signed   By: Zetta Bills M.D.   On: 05/28/2020 16:42     NM PET Image Restag (PS) Skull Base To Thigh  Result Date: 05/28/2020 CLINICAL DATA:  Subsequent treatment strategy for diffuse large B-cell lymphoma. EXAM: NUCLEAR MEDICINE PET SKULL BASE TO THIGH TECHNIQUE: 7.35 mCi F-18 FDG was injected intravenously. Full-ring PET imaging was performed from the skull base to thigh after the radiotracer. CT data was obtained and used for attenuation correction and anatomic localization. Fasting blood glucose: 85 mg/dl COMPARISON:  04/08/2020 FINDINGS: Mediastinal blood pool activity: SUV max 1.66 Liver activity: SUV max 2.74 NECK: Marked  interval decrease in the metabolism associated with lymph nodes in the neck. Level 2 lymph node on the RIGHT previously 0.9 cm now approximately 3 mm and without evidence of significant FDG uptake. Representative LEFT level 4 lymph nodes seen on the prior study previously with a maximum SUV of 22 shows mild enlargement on today's study at 1 cm as compared to 1.8 cm (image 47, series 4) current maximum SUV of 1.7. RIGHT thoracic inlet lymph node measuring 2.2 cm on today's study shown to be necrotic with some peripheral activity on the prior exam previously at 3.1 cm. Mild uptake at the periphery with a maximum SUV of 4.2. Symmetric bilateral presumed physiologic parotid uptake Incidental CT  findings: none CHEST: Bulky mediastinal and hilar lymph nodes seen on the previous exam have largely resolved. Thoracic inlet lymph node beneath the sternum (image 56, series 4) 9 mm short axis, previously 11 mm short axis maximum SUV of 2.0 as compared to 21 Subcarinal nodal tissue (image 78 of series 4) measuring 2 cm short axis, previously 2.8 cm short axis with a maximum SUV of 4.3 as compared to 22. RIGHT hilar lymph node (image 82, series 4) 18 mm as compared to 22 mm SUV at 2.5 as compared to 25 Soft tissue along the aorta corresponding to LEFT infrahilar nodal tissue measuring 1 cm short axis dimension on image 89 series 4 previously 2.8 cm maximum SUV of 2.9 as compared to as much as 24 on the prior study Resolution of activity associated with a mildly enlarged LEFT axillary lymph node that was seen on prior study previously with activity up to a maximum SUV of 21. Subcentimeter lymph node LEFT axilla on image 61 of series 4 with a SUV of 1.2 on the current study Incidental CT findings: Scattered aortic atherosclerotic changes. RIGHT-sided Port-A-Cath terminates in the RIGHT atrium. Heart size is normal without pericardial effusion. Aortic caliber is. Central pulmonary vascular caliber normal. Limited assessment of  cardiovascular structures given lack of intravenous contrast. Minimal basilar atelectasis. Airways are patent. ABDOMEN/PELVIS: Focal areas of uptake in the spleen resolved. Splenic activity remains slightly above background liver activity. Splenic size is stable, unenlarged. No hypermetabolic lymph nodes in the abdomen or pelvis. Increased metabolism within lymph nodes in the upper abdomen seen on the previous study has resolved. There is decreased size of a hepatogastric lymph node, now measuring 1.4 cm as compared to 2.8 cm. (Image 113, series 4) max SUV of 2.5. Incidental CT findings: Liver, pancreas gallbladder, adrenal glands, kidneys stomach, small large bowel without acute process. Stool throughout the colon. Calcified atheromatous plaque in the abdominal aorta. SKELETON: Diffuse increased metabolism throughout marrow spaces of the spine and pelvis in particular. No focal uptake. Incidental CT findings: none IMPRESSION: 1. Marked interval response to therapy with resolution of nearly all areas of increased FDG uptake compared to the previous study. Most areas with residual uptake Deauville category 3. Subcarinal and RIGHT thoracic inlet with uptake slightly greater than liver, technically Deauville category 4 as described. Maximum SUV on today's study is 4.3 in the subcarinal region. 2. No new signs of disease. 3. Diffuse marrow activity presumably related to marrow stimulation. Suggest attention on follow-up. Aortic Atherosclerosis (ICD10-I70.0). Electronically Signed   By: Zetta Bills M.D.   On: 05/28/2020 16:42   Assessment and Plan:   1) Newly diagnosed advanced stage IIIA Large B cell lymphoma - activated B cell type 04/08/2020 PET/CT ([id number]) revealed "1. Intensely hypermetabolic bilateral neck, bilateral axillary, bilateral mediastinal, bilateral hilar and retroperitoneal lymphadenopathy. Numerous small hypermetabolic splenic masses. Findings are compatible with malignancy, favoring  lymphoproliferative disorder."  -Admit to inpatient oncology unit for cycle 4 of Bryan [name].   -We will obtain a baseline CBC with differential and CMET.  CBC with differential has been reviewed and he has leukocytosis likely related to recent Neulasta, hemoglobin is stable, and platelets are normal. CMET is pending.  Anticipate starting chemotherapy today as planned once CMET has resulted. -Plan for intrathecal methotrexate on day 2 of cycle 4.  We will hold on DVT prophylaxis -As needed antiemetics and bowel regimen have been ordered. -Continue home pain medication. -The patient will have a lab, visit, rituximab at the cancer on  06/30/2020.  He will administer G-CSF at home as an outpatient on 06/30/2020  Mikey Bussing, DNP, AGPCNP-BC, AOCNP   ADDENDUM  .Patient was Personally and independently interviewed, examined and relevant elements of the history of present illness were reviewed in details and an assessment and plan was created. All elements of the patient's history of present illness , assessment and plan were discussed in details with Mikey Bussing, DNP, AGPCNP-BC, AOCNP. The above documentation reflects our combined findings assessment and plan.  Starting CNS prophylaxis with IT Methotrexate from tomorrow.  Labs stable. Patient in good spirits proceeding with C4 of EPOCH with same doses.  Sullivan Lone MD MS

## 2020-06-24 ENCOUNTER — Inpatient Hospital Stay (HOSPITAL_COMMUNITY): Payer: 59

## 2020-06-24 DIAGNOSIS — Z5111 Encounter for antineoplastic chemotherapy: Principal | ICD-10-CM

## 2020-06-24 DIAGNOSIS — C8330 Diffuse large B-cell lymphoma, unspecified site: Secondary | ICD-10-CM

## 2020-06-24 LAB — CSF CELL COUNT WITH DIFFERENTIAL
RBC Count, CSF: 23 /mm3 — ABNORMAL HIGH
Tube #: 4
WBC, CSF: 2 /mm3 (ref 0–5)

## 2020-06-24 LAB — CBC WITH DIFFERENTIAL/PLATELET
Abs Immature Granulocytes: 3.72 10*3/uL — ABNORMAL HIGH (ref 0.00–0.07)
Basophils Absolute: 0.2 10*3/uL — ABNORMAL HIGH (ref 0.0–0.1)
Basophils Relative: 1 %
Eosinophils Absolute: 0 10*3/uL (ref 0.0–0.5)
Eosinophils Relative: 0 %
HCT: 29.4 % — ABNORMAL LOW (ref 39.0–52.0)
Hemoglobin: 9.9 g/dL — ABNORMAL LOW (ref 13.0–17.0)
Immature Granulocytes: 13 %
Lymphocytes Relative: 4 %
Lymphs Abs: 1.1 10*3/uL (ref 0.7–4.0)
MCH: 30.5 pg (ref 26.0–34.0)
MCHC: 33.7 g/dL (ref 30.0–36.0)
MCV: 90.5 fL (ref 80.0–100.0)
Monocytes Absolute: 0.5 10*3/uL (ref 0.1–1.0)
Monocytes Relative: 2 %
Neutro Abs: 23.4 10*3/uL — ABNORMAL HIGH (ref 1.7–7.7)
Neutrophils Relative %: 80 %
Platelets: 353 10*3/uL (ref 150–400)
RBC: 3.25 MIL/uL — ABNORMAL LOW (ref 4.22–5.81)
RDW: 16.5 % — ABNORMAL HIGH (ref 11.5–15.5)
WBC: 29 10*3/uL — ABNORMAL HIGH (ref 4.0–10.5)
nRBC: 0 % (ref 0.0–0.2)

## 2020-06-24 LAB — COMPREHENSIVE METABOLIC PANEL
ALT: 25 U/L (ref 0–44)
AST: 17 U/L (ref 15–41)
Albumin: 3.5 g/dL (ref 3.5–5.0)
Alkaline Phosphatase: 72 U/L (ref 38–126)
Anion gap: 12 (ref 5–15)
BUN: 15 mg/dL (ref 8–23)
CO2: 19 mmol/L — ABNORMAL LOW (ref 22–32)
Calcium: 8.7 mg/dL — ABNORMAL LOW (ref 8.9–10.3)
Chloride: 103 mmol/L (ref 98–111)
Creatinine, Ser: 0.73 mg/dL (ref 0.61–1.24)
GFR, Estimated: >60 mL/min (ref >60)
Glucose, Bld: 121 mg/dL — ABNORMAL HIGH (ref 70–99)
Potassium: 3.8 mmol/L (ref 3.5–5.1)
Sodium: 134 mmol/L — ABNORMAL LOW (ref 135–145)
Total Bilirubin: 0.5 mg/dL (ref 0.3–1.2)
Total Protein: 5.9 g/dL — ABNORMAL LOW (ref 6.5–8.1)

## 2020-06-24 LAB — GLUCOSE, CSF: Glucose, CSF: 68 mg/dL (ref 40–70)

## 2020-06-24 LAB — PROTEIN, CSF: Total  Protein, CSF: 58 mg/dL — ABNORMAL HIGH (ref 15–45)

## 2020-06-24 MED ORDER — SODIUM CHLORIDE (PF) 0.9 % IJ SOLN
Freq: Once | INTRAMUSCULAR | Status: AC
  Filled 2020-06-24 (×2): qty 0.48

## 2020-06-24 MED ORDER — SODIUM CHLORIDE 0.9 % IV SOLN
Freq: Once | INTRAVENOUS | Status: AC
  Administered 2020-06-24: 18 mg via INTRAVENOUS
  Filled 2020-06-24: qty 4

## 2020-06-24 NOTE — Progress Notes (Addendum)
HEMATOLOGY-ONCOLOGY PROGRESS NOTE  SUBJECTIVE: Tolerated day 1 of chemotherapy well overall.  Reports mild nausea this morning but no vomiting.  Bowels are moving without any difficulty.  Denies mucositis.  The patient is scheduled to go to radiology for intrathecal methotrexate this morning.  Oncology History  Diffuse large B cell lymphoma ([place])  04/16/2020 Initial Diagnosis   Diffuse large B cell lymphoma ([place])   04/21/2020 -  Chemotherapy    Patient is on Treatment Plan: IP NON-HODGKINS LYMPHOMA EPOCH Q21D   Patient is on Antibody Plan: NON-HODGKINS LYMPHOMA RITUXIMAB Q21D    04/28/2020 -  Chemotherapy   The patient had pegfilgrastim-cbqv (UDENYCA) injection 6 mg, 6 mg, Subcutaneous, Once, 1 of 1 cycle riTUXimab-pvvr (RUXIENCE) 600 mg in sodium chloride 0.9 % 250 mL (1.9355 mg/mL) infusion, 375 mg/m2 = 600 mg, Intravenous,  Once, 1 of 1 cycle Administration: 600 mg (04/28/2020)  for chemotherapy treatment.       REVIEW OF SYSTEMS:   Constitutional: Denies fevers, chills Eyes: Denies blurriness of vision Ears, nose, mouth, throat, and face: Denies mucositis or sore throat Respiratory: Denies cough, dyspnea or wheezes Cardiovascular: Denies palpitation, chest discomfort Gastrointestinal: Reports mild nausea without vomiting.  Denies constipation. Skin: Denies abnormal skin rashes Lymphatics: Denies new lymphadenopathy or easy bruising Neurological:Denies numbness, tingling or new weaknesses Behavioral/Psych: Mood is stable, no new changes  Extremities: No lower extremity edema All other systems were reviewed with the patient and are negative.  I have reviewed the past medical history, past surgical history, social history and family history with the patient and they are unchanged from previous note.   PHYSICAL EXAMINATION: ECOG PERFORMANCE STATUS: 1 - Symptomatic but completely ambulatory  Vitals:   06/23/20 1904 06/24/20 0535  BP: 98/60 109/65  Pulse: 61 (!) 58  Resp:  14 16  Temp: 97.8 F (36.6 C) (!) 97.4 F (36.3 C)  SpO2: 95% 95%   Filed Weights   06/23/20 1528  Weight: 61.2 kg    Intake/Output from previous day: 01/17 0701 - 01/18 0700 In: 1173.8 [P.O.:420; I.V.:120.8; IV Piggyback:633] Out: 450 [Urine:450]  GENERAL:alert, no distress and comfortable SKIN: skin color, texture, turgor are normal, no rashes or significant lesions EYES: normal, Conjunctiva are pink and non-injected, sclera clear OROPHARYNX:no exudate, no erythema and lips, buccal mucosa, and tongue normal  NECK: supple, thyroid normal size, non-tender, without nodularity LYMPH:  no palpable lymphadenopathy in the cervical, axillary or inguinal LUNGS: clear to auscultation and percussion with normal breathing effort HEART: regular rate & rhythm and no murmurs and no lower extremity edema ABDOMEN:abdomen soft, non-tender and normal bowel sounds Musculoskeletal:no cyanosis of digits and no clubbing  NEURO: alert & oriented x 3 with fluent speech, no focal motor/sensory deficits  LABORATORY DATA:  I have reviewed the data as listed CMP Latest Ref Rng & Units 06/24/2020 06/23/2020 06/13/2020  Glucose 70 - 99 mg/dL 121(H) 105(H) 92  BUN 8 - 23 mg/dL 15 11 10   Creatinine 0.61 - 1.24 mg/dL 0.73 0.75 0.66  Sodium 135 - 145 mmol/L 134(L) 139 134(L)  Potassium 3.5 - 5.1 mmol/L 3.8 4.0 4.2  Chloride 98 - 111 mmol/L 103 105 104  CO2 22 - 32 mmol/L 19(L) 22 23  Calcium 8.9 - 10.3 mg/dL 8.7(L) 9.1 8.8(L)  Total Protein 6.5 - 8.1 g/dL 5.9(L) 6.2(L) 6.1(L)  Total Bilirubin 0.3 - 1.2 mg/dL 0.5 0.4 0.4  Alkaline Phos 38 - 126 U/L 72 92 111  AST 15 - 41 U/L 17 20 19   ALT  0 - 44 U/L 25 24 40    Lab Results  Component Value Date   WBC 29.0 (H) 06/24/2020   HGB 9.9 (L) 06/24/2020   HCT 29.4 (L) 06/24/2020   MCV 90.5 06/24/2020   PLT 353 06/24/2020   NEUTROABS 23.4 (H) 06/24/2020    NM PET Image Restag (PS) Skull Base To Thigh  Result Date: 05/28/2020 CLINICAL DATA:  Subsequent  treatment strategy for diffuse large B-cell lymphoma. EXAM: NUCLEAR MEDICINE PET SKULL BASE TO THIGH TECHNIQUE: 7.35 mCi F-18 FDG was injected intravenously. Full-ring PET imaging was performed from the skull base to thigh after the radiotracer. CT data was obtained and used for attenuation correction and anatomic localization. Fasting blood glucose: 85 mg/dl COMPARISON:  04/08/2020 FINDINGS: Mediastinal blood pool activity: SUV max 1.66 Liver activity: SUV max 2.74 NECK: Marked interval decrease in the metabolism associated with lymph nodes in the neck. Level 2 lymph node on the RIGHT previously 0.9 cm now approximately 3 mm and without evidence of significant FDG uptake. Representative LEFT level 4 lymph nodes seen on the prior study previously with a maximum SUV of 22 shows mild enlargement on today's study at 1 cm as compared to 1.8 cm (image 47, series 4) current maximum SUV of 1.7. RIGHT thoracic inlet lymph node measuring 2.2 cm on today's study shown to be necrotic with some peripheral activity on the prior exam previously at 3.1 cm. Mild uptake at the periphery with a maximum SUV of 4.2. Symmetric bilateral presumed physiologic parotid uptake Incidental CT findings: none CHEST: Bulky mediastinal and hilar lymph nodes seen on the previous exam have largely resolved. Thoracic inlet lymph node beneath the sternum (image 56, series 4) 9 mm short axis, previously 11 mm short axis maximum SUV of 2.0 as compared to 21 Subcarinal nodal tissue (image 78 of series 4) measuring 2 cm short axis, previously 2.8 cm short axis with a maximum SUV of 4.3 as compared to 22. RIGHT hilar lymph node (image 82, series 4) 18 mm as compared to 22 mm SUV at 2.5 as compared to 25 Soft tissue along the aorta corresponding to LEFT infrahilar nodal tissue measuring 1 cm short axis dimension on image 89 series 4 previously 2.8 cm maximum SUV of 2.9 as compared to as much as 24 on the prior study Resolution of activity associated with a  mildly enlarged LEFT axillary lymph node that was seen on prior study previously with activity up to a maximum SUV of 21. Subcentimeter lymph node LEFT axilla on image 61 of series 4 with a SUV of 1.2 on the current study Incidental CT findings: Scattered aortic atherosclerotic changes. RIGHT-sided Port-A-Cath terminates in the RIGHT atrium. Heart size is normal without pericardial effusion. Aortic caliber is. Central pulmonary vascular caliber normal. Limited assessment of cardiovascular structures given lack of intravenous contrast. Minimal basilar atelectasis. Airways are patent. ABDOMEN/PELVIS: Focal areas of uptake in the spleen resolved. Splenic activity remains slightly above background liver activity. Splenic size is stable, unenlarged. No hypermetabolic lymph nodes in the abdomen or pelvis. Increased metabolism within lymph nodes in the upper abdomen seen on the previous study has resolved. There is decreased size of a hepatogastric lymph node, now measuring 1.4 cm as compared to 2.8 cm. (Image 113, series 4) max SUV of 2.5. Incidental CT findings: Liver, pancreas gallbladder, adrenal glands, kidneys stomach, small large bowel without acute process. Stool throughout the colon. Calcified atheromatous plaque in the abdominal aorta. SKELETON: Diffuse increased metabolism throughout marrow spaces of  the spine and pelvis in particular. No focal uptake. Incidental CT findings: none IMPRESSION: 1. Marked interval response to therapy with resolution of nearly all areas of increased FDG uptake compared to the previous study. Most areas with residual uptake Deauville category 3. Subcarinal and RIGHT thoracic inlet with uptake slightly greater than liver, technically Deauville category 4 as described. Maximum SUV on today's study is 4.3 in the subcarinal region. 2. No new signs of disease. 3. Diffuse marrow activity presumably related to marrow stimulation. Suggest attention on follow-up. Aortic Atherosclerosis  (ICD10-I70.0). Electronically Signed   By: Zetta Bills M.D.   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 Intensely hypermetabolic bilateral neck, bilateral axillary, bilateral mediastinal, bilateral hilar and retroperitoneal lymphadenopathy. Numerous small hypermetabolic splenic masses. Findings are compatible with malignancy, favoring lymphoproliferative disorder." PLAN -The patient tolerated day 1 of cycle #4 of his chemotherapy well overall.  Reports mild nausea.   -CBC from this morning has been reviewed.  He has leukocytosis due to prednisone and recent Neulasta.  He has mild anemia.  Labs are adequate to continue with chemotherapy. -Continue to check daily CBC with differential and CMET. -Continue as needed antiemetics and bowel regimen. -Continue home pain medication. -SCDs for DVT prophylaxis.  Anticoagulation on hold secondary to intrathecal methotrexate. -Labs, follow-up, visit, and rituximab on 06/30/2020.  He will administer G-CSF at home on 06/30/2020.   LOS: 1 day   Mikey Bussing, DNP, AGPCNP-BC, AOCNP 06/24/20   ADDENDUM  .Patient was Personally and independently interviewed, examined and relevant elements of the history of present illness were reviewed in details and an assessment and plan was created. All elements of the patient's history of present illness , assessment and plan were discussed in details with Mikey Bussing, DNP, AGPCNP-BC, AOCNP. The above documentation reflects our combined findings assessment and plan.  Patient tolerated day 2 intrathecal methotrexate for CNS prophylaxis well without any acute issues.  Minimal back pain that is resolving no significant spinal headaches. Overall labs stable with no other acute toxicities from his current cycle of EPOCH-R.  Sullivan Lone MD MS

## 2020-06-25 DIAGNOSIS — D649 Anemia, unspecified: Secondary | ICD-10-CM

## 2020-06-25 LAB — COMPREHENSIVE METABOLIC PANEL
ALT: 24 U/L (ref 0–44)
AST: 19 U/L (ref 15–41)
Albumin: 3.6 g/dL (ref 3.5–5.0)
Alkaline Phosphatase: 72 U/L (ref 38–126)
Anion gap: 10 (ref 5–15)
BUN: 16 mg/dL (ref 8–23)
CO2: 22 mmol/L (ref 22–32)
Calcium: 8.8 mg/dL — ABNORMAL LOW (ref 8.9–10.3)
Chloride: 109 mmol/L (ref 98–111)
Creatinine, Ser: 0.7 mg/dL (ref 0.61–1.24)
GFR, Estimated: >60 mL/min (ref >60)
Glucose, Bld: 105 mg/dL — ABNORMAL HIGH (ref 70–99)
Potassium: 3.8 mmol/L (ref 3.5–5.1)
Sodium: 141 mmol/L (ref 135–145)
Total Bilirubin: 0.5 mg/dL (ref 0.3–1.2)
Total Protein: 5.8 g/dL — ABNORMAL LOW (ref 6.5–8.1)

## 2020-06-25 LAB — CBC WITH DIFFERENTIAL/PLATELET
Abs Immature Granulocytes: 1.91 10*3/uL — ABNORMAL HIGH (ref 0.00–0.07)
Basophils Absolute: 0.1 10*3/uL (ref 0.0–0.1)
Basophils Relative: 0 %
Eosinophils Absolute: 0 10*3/uL (ref 0.0–0.5)
Eosinophils Relative: 0 %
HCT: 28.5 % — ABNORMAL LOW (ref 39.0–52.0)
Hemoglobin: 9.7 g/dL — ABNORMAL LOW (ref 13.0–17.0)
Immature Granulocytes: 7 %
Lymphocytes Relative: 3 %
Lymphs Abs: 0.8 10*3/uL (ref 0.7–4.0)
MCH: 30.7 pg (ref 26.0–34.0)
MCHC: 34 g/dL (ref 30.0–36.0)
MCV: 90.2 fL (ref 80.0–100.0)
Monocytes Absolute: 1.2 10*3/uL — ABNORMAL HIGH (ref 0.1–1.0)
Monocytes Relative: 4 %
Neutro Abs: 23.1 10*3/uL — ABNORMAL HIGH (ref 1.7–7.7)
Neutrophils Relative %: 86 %
Platelets: 360 10*3/uL (ref 150–400)
RBC: 3.16 MIL/uL — ABNORMAL LOW (ref 4.22–5.81)
RDW: 16.6 % — ABNORMAL HIGH (ref 11.5–15.5)
WBC: 27.1 10*3/uL — ABNORMAL HIGH (ref 4.0–10.5)
nRBC: 0 % (ref 0.0–0.2)

## 2020-06-25 LAB — CYTOLOGY - NON PAP

## 2020-06-25 MED ORDER — VINCRISTINE SULFATE CHEMO INJECTION 1 MG/ML
Freq: Once | INTRAVENOUS | Status: AC
  Filled 2020-06-25: qty 10

## 2020-06-25 MED ORDER — SODIUM CHLORIDE 0.9 % IV SOLN
Freq: Once | INTRAVENOUS | Status: AC
  Administered 2020-06-26: 18 mg via INTRAVENOUS
  Filled 2020-06-25: qty 4

## 2020-06-25 MED ORDER — SODIUM CHLORIDE 0.9 % IV SOLN
Freq: Once | INTRAVENOUS | Status: AC
  Administered 2020-06-25: 18 mg via INTRAVENOUS
  Filled 2020-06-25: qty 4

## 2020-06-26 DIAGNOSIS — D649 Anemia, unspecified: Secondary | ICD-10-CM

## 2020-06-26 DIAGNOSIS — Z5111 Encounter for antineoplastic chemotherapy: Secondary | ICD-10-CM | POA: Diagnosis not present

## 2020-06-26 DIAGNOSIS — C8338 Diffuse large B-cell lymphoma, lymph nodes of multiple sites: Secondary | ICD-10-CM | POA: Diagnosis not present

## 2020-06-26 LAB — COMPREHENSIVE METABOLIC PANEL
ALT: 27 U/L (ref 0–44)
AST: 22 U/L (ref 15–41)
Albumin: 3.3 g/dL — ABNORMAL LOW (ref 3.5–5.0)
Alkaline Phosphatase: 63 U/L (ref 38–126)
Anion gap: 9 (ref 5–15)
BUN: 16 mg/dL (ref 8–23)
CO2: 23 mmol/L (ref 22–32)
Calcium: 8.7 mg/dL — ABNORMAL LOW (ref 8.9–10.3)
Chloride: 107 mmol/L (ref 98–111)
Creatinine, Ser: 0.68 mg/dL (ref 0.61–1.24)
GFR, Estimated: >60 mL/min (ref >60)
Glucose, Bld: 93 mg/dL (ref 70–99)
Potassium: 3.5 mmol/L (ref 3.5–5.1)
Sodium: 139 mmol/L (ref 135–145)
Total Bilirubin: 0.5 mg/dL (ref 0.3–1.2)
Total Protein: 5.6 g/dL — ABNORMAL LOW (ref 6.5–8.1)

## 2020-06-26 LAB — CBC WITH DIFFERENTIAL/PLATELET
Abs Immature Granulocytes: 0.49 10*3/uL — ABNORMAL HIGH (ref 0.00–0.07)
Basophils Absolute: 0 10*3/uL (ref 0.0–0.1)
Basophils Relative: 0 %
Eosinophils Absolute: 0 10*3/uL (ref 0.0–0.5)
Eosinophils Relative: 0 %
HCT: 28.1 % — ABNORMAL LOW (ref 39.0–52.0)
Hemoglobin: 9.5 g/dL — ABNORMAL LOW (ref 13.0–17.0)
Immature Granulocytes: 4 %
Lymphocytes Relative: 7 %
Lymphs Abs: 0.8 10*3/uL (ref 0.7–4.0)
MCH: 30.6 pg (ref 26.0–34.0)
MCHC: 33.8 g/dL (ref 30.0–36.0)
MCV: 90.6 fL (ref 80.0–100.0)
Monocytes Absolute: 0.6 10*3/uL (ref 0.1–1.0)
Monocytes Relative: 5 %
Neutro Abs: 9.9 10*3/uL — ABNORMAL HIGH (ref 1.7–7.7)
Neutrophils Relative %: 84 %
Platelets: 341 10*3/uL (ref 150–400)
RBC: 3.1 MIL/uL — ABNORMAL LOW (ref 4.22–5.81)
RDW: 16.4 % — ABNORMAL HIGH (ref 11.5–15.5)
WBC: 11.9 10*3/uL — ABNORMAL HIGH (ref 4.0–10.5)
nRBC: 0 % (ref 0.0–0.2)

## 2020-06-26 MED ORDER — SENNOSIDES-DOCUSATE SODIUM 8.6-50 MG PO TABS
2.0000 | ORAL_TABLET | Freq: Two times a day (BID) | ORAL | Status: DC
  Administered 2020-06-26: 2 via ORAL
  Filled 2020-06-26: qty 2

## 2020-06-26 MED ORDER — ENOXAPARIN SODIUM 40 MG/0.4ML ~~LOC~~ SOLN: 40.0000 mg | Freq: Every day | SUBCUTANEOUS | Status: DC

## 2020-06-26 NOTE — Progress Notes (Signed)
[name]   HEMATOLOGY/ONCOLOGY INPATIENT PROGRESS NOTE  Date of Service: 06/26/2020  Inpatient Attending: .Brunetta Genera, MD   SUBJECTIVE  Bryan [name] is tolerating cycle 4 day 3 of EPOCH-R without any acute new concerns.  Some grade 1 fatigue.  Eating well.  Hydrating well.  No back pain or headaches. No uncontrolled nausea or vomiting. He is in good spirits today.  Any    OBJECTIVE:  NAD  PHYSICAL EXAMINATION: . Vitals:   06/25/20 0407 06/25/20 1842 06/25/20 2045 06/26/20 0511  BP: 95/64 101/73 107/71 112/72  Pulse: 66 (!) 54 (!) 56 (!) 57  Resp: 16 15 19 18   Temp: 97.7 F (36.5 C) 97.6 F (36.4 C) (!) 97.5 F (36.4 C) 97.8 F (36.6 C)  TempSrc: Oral Oral Oral Oral  SpO2: 99% 96% 99% 99%  Weight:      Height:       Filed Weights   06/23/20 1528  Weight: 135 lb (61.2 kg)   .Body mass index is 21.14 kg/m.  GENERAL:alert, in no acute distress and comfortable SKIN: skin color, texture, turgor are normal, no rashes or significant lesions EYES: normal, conjunctiva are pink and non-injected, sclera clear OROPHARYNX:no exudate, no erythema and lips, buccal mucosa, and tongue normal  NECK: supple, no JVD, thyroid normal size, non-tender, without nodularity LYMPH:  no palpable lymphadenopathy in the cervical, axillary or inguinal LUNGS: clear to auscultation with normal respiratory effort HEART: regular rate & rhythm,  no murmurs and no lower extremity edema ABDOMEN: abdomen soft, non-tender, normoactive bowel sounds  Musculoskeletal: no cyanosis of digits and no clubbing  PSYCH: alert & oriented x 3 with fluent speech NEURO: no focal motor/sensory deficits  MEDICAL HISTORY:  Past Medical History:  Diagnosis Date  . Prostate cancer [facility])     SURGICAL HISTORY: Past Surgical History:  Procedure Laterality Date  . CRYOTHERAPY    . IR IMAGING GUIDED PORT INSERTION  05/08/2020  . PROSTATE SURGERY      SOCIAL HISTORY: Social History   Socioeconomic History   . Marital status: Married    Spouse name: 1  . Number of children: Not on file  . Years of education: Not on file  . Highest education level: Not on file  Occupational History  . Occupation: Futures trader: [employer]    Comment: medical leave  Tobacco Use  . Smoking status: Never Smoker  . Smokeless tobacco: Never Used  Vaping Use  . Vaping Use: Never used  Substance and Sexual Activity  . Alcohol use: Not Currently  . Drug use: Never  . Sexual activity: Yes  Other Topics Concern  . Not on file  Social History Narrative  . Not on file   Social Determinants of Health   Financial Resource Strain: Not on file  Food Insecurity: Not on file  Transportation Needs: Not on file  Physical Activity: Not on file  Stress: Not on file  Social Connections: Not on file  Intimate Partner Violence: Not on file    FAMILY HISTORY: Family History  Problem Relation Age of Onset  . Bladder Cancer Mother   . Esophageal cancer Mother   . Prostate cancer Father   . Prostate cancer Paternal Uncle     ALLERGIES:  has No Known Allergies.  MEDICATIONS:  Scheduled Meds: . Chlorhexidine Gluconate Cloth  6 each Topical Daily  . DOXOrubicin/vinCRIStine/etoposide CHEMO IV infusion for Inpatient CI   Intravenous Once  . dronabinol  5 mg Oral BID AC  .  polyethylene glycol  17 g Oral Daily  . predniSONE  60 mg Oral QAC breakfast  . senna-docusate  2 tablet Oral BID  . sodium bicarbonate/sodium chloride  1 application Mouth Rinse QID  . sodium chloride flush  10-40 mL Intracatheter Q12H   Continuous Infusions: . sodium chloride 10 mL/hr at 06/24/20 0541   PRN Meds:.acetaminophen, acetaminophen-codeine, Cold Pack, Hot Pack, lidocaine-prilocaine, LORazepam, ondansetron, prochlorperazine, sodium chloride flush, sodium chloride flush  REVIEW OF SYSTEMS:    10 Point review of Systems was done is negative except as noted above.   LABORATORY DATA:  I have reviewed the data as  listed  . CBC Latest Ref Rng & Units 06/25/2020 06/24/2020  WBC 4.0 - 10.5 K/uL 27.1(H) 29.0(H)  Hemoglobin 13.0 - 17.0 g/dL 9.7(L) 9.9(L)  Hematocrit 39.0 - 52.0 % 28.5(L) 29.4(L)  Platelets 150 - 400 K/uL 360 353    . CMP Latest Ref Rng & Units 06/25/2020 06/24/2020  Glucose 70 - 99 mg/dL 105(H) 121(H)  BUN 8 - 23 mg/dL 16 15  Creatinine 0.61 - 1.24 mg/dL 0.70 0.73  Sodium 135 - 145 mmol/L 141 134(L)  Potassium 3.5 - 5.1 mmol/L 3.8 3.8  Chloride 98 - 111 mmol/L 109 103  CO2 22 - 32 mmol/L 22 19(L)  Calcium 8.9 - 10.3 mg/dL 8.8(L) 8.7(L)  Total Protein 6.5 - 8.1 g/dL 5.8(L) 5.9(L)  Total Bilirubin 0.3 - 1.2 mg/dL 0.5 0.5  Alkaline Phos 38 - 126 U/L 72 72  AST 15 - 41 U/L 19 17  ALT 0 - 44 U/L 24 25     RADIOGRAPHIC STUDIES: I have personally reviewed the radiological images as listed and agreed with the findings in the report. NM PET Image Restag (PS) Skull Base To Thigh  Result Date: 05/28/2020 CLINICAL DATA:  Subsequent treatment strategy for diffuse large B-cell lymphoma. EXAM: NUCLEAR MEDICINE PET SKULL BASE TO THIGH TECHNIQUE: 7.35 mCi F-18 FDG was injected intravenously. Full-ring PET imaging was performed from the skull base to thigh after the radiotracer. CT data was obtained and used for attenuation correction and anatomic localization. Fasting blood glucose: 85 mg/dl COMPARISON:  04/08/2020 FINDINGS: Mediastinal blood pool activity: SUV max 1.66 Liver activity: SUV max 2.74 NECK: Marked interval decrease in the metabolism associated with lymph nodes in the neck. Level 2 lymph node on the RIGHT previously 0.9 cm now approximately 3 mm and without evidence of significant FDG uptake. Representative LEFT level 4 lymph nodes seen on the prior study previously with a maximum SUV of 22 shows mild enlargement on today's study at 1 cm as compared to 1.8 cm (image 47, series 4) current maximum SUV of 1.7. RIGHT thoracic inlet lymph node measuring 2.2 cm on today's study shown to be  necrotic with some peripheral activity on the prior exam previously at 3.1 cm. Mild uptake at the periphery with a maximum SUV of 4.2. Symmetric bilateral presumed physiologic parotid uptake Incidental CT findings: none CHEST: Bulky mediastinal and hilar lymph nodes seen on the previous exam have largely resolved. Thoracic inlet lymph node beneath the sternum (image 56, series 4) 9 mm short axis, previously 11 mm short axis maximum SUV of 2.0 as compared to 21 Subcarinal nodal tissue (image 78 of series 4) measuring 2 cm short axis, previously 2.8 cm short axis with a maximum SUV of 4.3 as compared to 22. RIGHT hilar lymph node (image 82, series 4) 18 mm as compared to 22 mm SUV at 2.5 as compared to 25 Soft tissue  along the aorta corresponding to LEFT infrahilar nodal tissue measuring 1 cm short axis dimension on image 89 series 4 previously 2.8 cm maximum SUV of 2.9 as compared to as much as 24 on the prior study Resolution of activity associated with a mildly enlarged LEFT axillary lymph node that was seen on prior study previously with activity up to a maximum SUV of 21. Subcentimeter lymph node LEFT axilla on image 61 of series 4 with a SUV of 1.2 on the current study Incidental CT findings: Scattered aortic atherosclerotic changes. RIGHT-sided Port-A-Cath terminates in the RIGHT atrium. Heart size is normal without pericardial effusion. Aortic caliber is. Central pulmonary vascular caliber normal. Limited assessment of cardiovascular structures given lack of intravenous contrast. Minimal basilar atelectasis. Airways are patent. ABDOMEN/PELVIS: Focal areas of uptake in the spleen resolved. Splenic activity remains slightly above background liver activity. Splenic size is stable, unenlarged. No hypermetabolic lymph nodes in the abdomen or pelvis. Increased metabolism within lymph nodes in the upper abdomen seen on the previous study has resolved. There is decreased size of a hepatogastric lymph node, now  measuring 1.4 cm as compared to 2.8 cm. (Image 113, series 4) max SUV of 2.5. Incidental CT findings: Liver, pancreas gallbladder, adrenal glands, kidneys stomach, small large bowel without acute process. Stool throughout the colon. Calcified atheromatous plaque in the abdominal aorta. SKELETON: Diffuse increased metabolism throughout marrow spaces of the spine and pelvis in particular. No focal uptake. Incidental CT findings: none IMPRESSION: 1. Marked interval response to therapy with resolution of nearly all areas of increased FDG uptake compared to the previous study. Most areas with residual uptake Deauville category 3. Subcarinal and RIGHT thoracic inlet with uptake slightly greater than liver, technically Deauville category 4 as described. Maximum SUV on today's study is 4.3 in the subcarinal region. 2. No new signs of disease. 3. Diffuse marrow activity presumably related to marrow stimulation. Suggest attention on follow-up. Aortic Atherosclerosis (ICD10-I70.0). Electronically Signed   By: Zetta Bills M.D.   On: 05/28/2020 16:42   DG FLUORO GUIDE LUMBAR PUNCTURE  Result Date: 06/24/2020 CLINICAL DATA:  65 year old male with history of diffuse large B-cell lymphoma. EXAM: FLUOROSCOPICALLY GUIDED LUMBAR PUNCTURE FOR INTRATHECAL CHEMOTHERAPY TECHNIQUE: Informed consent was obtained from the patient prior to the procedure, including potential complications of headache, allergy, and pain. A 'time out' was performed. With the patient prone, the lower back was prepped with Betadine. 1% Lidocaine was used for local anesthesia. Lumbar puncture was performed at the L2-L3 using a 20 gauge needle with return of blood-tinged CSF. Opening pressure was 11.5 cm of water. A total of 7 mL of CSF was collected and sent to the laboratory for testing per orders of the primary medical team. 12 mg of methotrexate was injected into the subarachnoid space. The patient tolerated the procedure well without apparent  complication. FLUOROSCOPY TIME:  0.7 minutes IMPRESSION: Intrathecal injection of chemotherapy without complication Electronically Signed   By: Vinnie Langton M.D.   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 Intensely hypermetabolic bilateral neck, bilateral axillary, bilateral mediastinal, bilateral hilar and retroperitoneal lymphadenopathy. Numerous small hypermetabolic splenic masses. Findings are compatible with malignancy, favoring lymphoproliferative disorder."  PET CT scan on 12/12-showed Marked interval response to therapy with resolution of nearly all areas of increased FDG uptake compared to  the previous study. Most areas with residual uptake Deauville category 3. Subcarinal and RIGHT thoracic inlet with uptake slightly greater than liver, technically Deauville category 4 as described. Maximum SUV on today's study is 4.3 in the subcarinal region. 2. No new signs of disease.  #2 grade 1 fatigue related to chemotherapy  #3 mild normocytic anemia related to chemotherapy  #4 leukocytosis related to prednisone and previous Neulasta shot.  No signs of infection. PLAN -The patient tolerated day 3 of cycle #4 of his chemotherapy well overall.  -Labs reviewed with patient today -Patient has no adverse effects at this time from his intrathecal methotrexate for CNS prophylaxis yesterday.  CSF with no evidence of lymphoma. -Continue to check daily CBC with differential and CMET. -Continue as needed antiemetics and bowel regimen. -Continue home pain medication.  As needed Tylenol 3 -SCDs for DVT prophylaxis due to intrathecal methotrexate/lumbar puncture.  We will restart Lovenox subcu prophylactically from tomorrow. -Labs, follow-up, visit, and rituximab on 06/30/2020.  He will administer G-CSF at home on  06/29/2020.     Sullivan Lone MD [place] AAHIVMS [facility] Hematology/Oncology Physician [facility]  (Office):       [phone] (Work cell):  [phone] (Fax):           [phone]

## 2020-06-26 NOTE — Progress Notes (Addendum)
HEMATOLOGY-ONCOLOGY PROGRESS NOTE  SUBJECTIVE: Tolerating chemo well overall. Reports mild nausea this morning but no vomiting. Bowels have not moved recently, but he thinks this is due to not eating much. Denies mucositis. Denies headache this morning.   Oncology History  Diffuse large B cell lymphoma ([place])  04/16/2020 Initial Diagnosis   Diffuse large B cell lymphoma ([place])   04/21/2020 -  Chemotherapy    Patient is on Treatment Plan: IP NON-HODGKINS LYMPHOMA EPOCH Q21D   Patient is on Antibody Plan: NON-HODGKINS LYMPHOMA RITUXIMAB Q21D    04/28/2020 -  Chemotherapy   The patient had pegfilgrastim-cbqv (UDENYCA) injection 6 mg, 6 mg, Subcutaneous, Once, 1 of 1 cycle riTUXimab-pvvr (RUXIENCE) 600 mg in sodium chloride 0.9 % 250 mL (1.9355 mg/mL) infusion, 375 mg/m2 = 600 mg, Intravenous,  Once, 1 of 1 cycle Administration: 600 mg (04/28/2020)  for chemotherapy treatment.       REVIEW OF SYSTEMS:   Constitutional: Denies fevers, chills Eyes: Denies blurriness of vision Ears, nose, mouth, throat, and face: Denies mucositis or sore throat Respiratory: Denies cough, dyspnea or wheezes Cardiovascular: Denies palpitation, chest discomfort Gastrointestinal: Reports mild nausea without vomiting.   Skin: Denies abnormal skin rashes Lymphatics: Denies new lymphadenopathy or easy bruising Neurological:Denies numbness, tingling or new weaknesses Behavioral/Psych: Mood is stable, no new changes  Extremities: No lower extremity edema All other systems were reviewed with the patient and are negative.  I have reviewed the past medical history, past surgical history, social history and family history with the patient and they are unchanged from previous note.   PHYSICAL EXAMINATION: ECOG PERFORMANCE STATUS: 1 - Symptomatic but completely ambulatory  Vitals:   06/25/20 2045 06/26/20 0511  BP: 107/71 112/72  Pulse: (!) 56 (!) 57  Resp: 19 18  Temp: (!) 97.5 F (36.4 C) 97.8 F (36.6 C)   SpO2: 99% 99%   Filed Weights   06/23/20 1528  Weight: 61.2 kg    Intake/Output from previous day: 01/19 0701 - 01/20 0700 In: 13 [P.O.:880] Out: 1 [Urine:1]  GENERAL:alert, no distress and comfortable SKIN: skin color, texture, turgor are normal, no rashes or significant lesions EYES: normal, Conjunctiva are pink and non-injected, sclera clear OROPHARYNX:no exudate, no erythema and lips, buccal mucosa, and tongue normal  NECK: supple, thyroid normal size, non-tender, without nodularity LYMPH:  no palpable lymphadenopathy in the cervical, axillary or inguinal LUNGS: clear to auscultation and percussion with normal breathing effort HEART: regular rate & rhythm and no murmurs and no lower extremity edema ABDOMEN:abdomen soft, non-tender and normal bowel sounds Musculoskeletal:no cyanosis of digits and no clubbing  NEURO: alert & oriented x 3 with fluent speech, no focal motor/sensory deficits  LABORATORY DATA:  I have reviewed the data as listed CMP Latest Ref Rng & Units 06/26/2020 06/25/2020 06/24/2020  Glucose 70 - 99 mg/dL 93 105(H) 121(H)  BUN 8 - 23 mg/dL 16 16 15   Creatinine 0.61 - 1.24 mg/dL 0.68 0.70 0.73  Sodium 135 - 145 mmol/L 139 141 134(L)  Potassium 3.5 - 5.1 mmol/L 3.5 3.8 3.8  Chloride 98 - 111 mmol/L 107 109 103  CO2 22 - 32 mmol/L 23 22 19(L)  Calcium 8.9 - 10.3 mg/dL 8.7(L) 8.8(L) 8.7(L)  Total Protein 6.5 - 8.1 g/dL 5.6(L) 5.8(L) 5.9(L)  Total Bilirubin 0.3 - 1.2 mg/dL 0.5 0.5 0.5  Alkaline Phos 38 - 126 U/L 63 72 72  AST 15 - 41 U/L 22 19 17   ALT 0 - 44 U/L 27 24 25  Lab Results  Component Value Date   WBC 11.9 (H) 06/26/2020   HGB 9.5 (L) 06/26/2020   HCT 28.1 (L) 06/26/2020   MCV 90.6 06/26/2020   PLT 341 06/26/2020   NEUTROABS 9.9 (H) 06/26/2020    NM PET Image Restag (PS) Skull Base To Thigh  Result Date: 05/28/2020 CLINICAL DATA:  Subsequent treatment strategy for diffuse large B-cell lymphoma. EXAM: NUCLEAR MEDICINE PET SKULL BASE TO  THIGH TECHNIQUE: 7.35 mCi F-18 FDG was injected intravenously. Full-ring PET imaging was performed from the skull base to thigh after the radiotracer. CT data was obtained and used for attenuation correction and anatomic localization. Fasting blood glucose: 85 mg/dl COMPARISON:  04/08/2020 FINDINGS: Mediastinal blood pool activity: SUV max 1.66 Liver activity: SUV max 2.74 NECK: Marked interval decrease in the metabolism associated with lymph nodes in the neck. Level 2 lymph node on the RIGHT previously 0.9 cm now approximately 3 mm and without evidence of significant FDG uptake. Representative LEFT level 4 lymph nodes seen on the prior study previously with a maximum SUV of 22 shows mild enlargement on today's study at 1 cm as compared to 1.8 cm (image 47, series 4) current maximum SUV of 1.7. RIGHT thoracic inlet lymph node measuring 2.2 cm on today's study shown to be necrotic with some peripheral activity on the prior exam previously at 3.1 cm. Mild uptake at the periphery with a maximum SUV of 4.2. Symmetric bilateral presumed physiologic parotid uptake Incidental CT findings: none CHEST: Bulky mediastinal and hilar lymph nodes seen on the previous exam have largely resolved. Thoracic inlet lymph node beneath the sternum (image 56, series 4) 9 mm short axis, previously 11 mm short axis maximum SUV of 2.0 as compared to 21 Subcarinal nodal tissue (image 78 of series 4) measuring 2 cm short axis, previously 2.8 cm short axis with a maximum SUV of 4.3 as compared to 22. RIGHT hilar lymph node (image 82, series 4) 18 mm as compared to 22 mm SUV at 2.5 as compared to 25 Soft tissue along the aorta corresponding to LEFT infrahilar nodal tissue measuring 1 cm short axis dimension on image 89 series 4 previously 2.8 cm maximum SUV of 2.9 as compared to as much as 24 on the prior study Resolution of activity associated with a mildly enlarged LEFT axillary lymph node that was seen on prior study previously with activity  up to a maximum SUV of 21. Subcentimeter lymph node LEFT axilla on image 61 of series 4 with a SUV of 1.2 on the current study Incidental CT findings: Scattered aortic atherosclerotic changes. RIGHT-sided Port-A-Cath terminates in the RIGHT atrium. Heart size is normal without pericardial effusion. Aortic caliber is. Central pulmonary vascular caliber normal. Limited assessment of cardiovascular structures given lack of intravenous contrast. Minimal basilar atelectasis. Airways are patent. ABDOMEN/PELVIS: Focal areas of uptake in the spleen resolved. Splenic activity remains slightly above background liver activity. Splenic size is stable, unenlarged. No hypermetabolic lymph nodes in the abdomen or pelvis. Increased metabolism within lymph nodes in the upper abdomen seen on the previous study has resolved. There is decreased size of a hepatogastric lymph node, now measuring 1.4 cm as compared to 2.8 cm. (Image 113, series 4) max SUV of 2.5. Incidental CT findings: Liver, pancreas gallbladder, adrenal glands, kidneys stomach, small large bowel without acute process. Stool throughout the colon. Calcified atheromatous plaque in the abdominal aorta. SKELETON: Diffuse increased metabolism throughout marrow spaces of the spine and pelvis in particular. No focal uptake. Incidental  CT findings: none IMPRESSION: 1. Marked interval response to therapy with resolution of nearly all areas of increased FDG uptake compared to the previous study. Most areas with residual uptake Deauville category 3. Subcarinal and RIGHT thoracic inlet with uptake slightly greater than liver, technically Deauville category 4 as described. Maximum SUV on today's study is 4.3 in the subcarinal region. 2. No new signs of disease. 3. Diffuse marrow activity presumably related to marrow stimulation. Suggest attention on follow-up. Aortic Atherosclerosis (ICD10-I70.0). Electronically Signed   By: Zetta Bills M.D.   On: 05/28/2020 16:42   DG FLUORO  GUIDE LUMBAR PUNCTURE  Result Date: 06/24/2020 CLINICAL DATA:  65 year old male with history of diffuse large B-cell lymphoma. EXAM: FLUOROSCOPICALLY GUIDED LUMBAR PUNCTURE FOR INTRATHECAL CHEMOTHERAPY TECHNIQUE: Informed consent was obtained from the patient prior to the procedure, including potential complications of headache, allergy, and pain. A 'time out' was performed. With the patient prone, the lower back was prepped with Betadine. 1% Lidocaine was used for local anesthesia. Lumbar puncture was performed at the L2-L3 using a 20 gauge needle with return of blood-tinged CSF. Opening pressure was 11.5 cm of water. A total of 7 mL of CSF was collected and sent to the laboratory for testing per orders of the primary medical team. 12 mg of methotrexate was injected into the subarachnoid space. The patient tolerated the procedure well without apparent complication. FLUOROSCOPY TIME:  0.7 minutes IMPRESSION: Intrathecal injection of chemotherapy without complication Electronically Signed   By: Vinnie Langton M.D.   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 Intensely hypermetabolic bilateral neck, bilateral axillary, bilateral mediastinal, bilateral hilar and retroperitoneal lymphadenopathy. Numerous small hypermetabolic splenic masses. Findings are compatible with malignancy, favoring lymphoproliferative disorder." PLAN -Tolerating cycle #4 of his chemotherapy well overall.  Reports mild nausea.   -Labs from this morning have been reviewed and are adequate to continue on chemotherapy.  Proceed with day 4 cycle #4 of his chemotherapy today as planned. -Continue to check daily CBC with differential and CMET. -Continue as needed antiemetics and will increase Senokot-S to 2 tablets twice a day.  We will continue MiraLAX daily. -Continue home pain medication. -Begin  Lovenox for DVT prophylaxis. -Labs, follow-up, visit, and rituximab on 06/30/2020.  He will administer G-CSF at home on 06/29/2020.   LOS: 3 days   Mikey Bussing, DNP, AGPCNP-BC, AOCNP 06/26/20  ADDENDUM  .Patient was Personally and independently interviewed, examined and relevant elements of the history of present illness were reviewed in details and an assessment and plan was created. All elements of the patient's history of present illness , assessment and plan were discussed in details with Mikey Bussing, DNP, AGPCNP-BC, AOCNP. The above documentation reflects our combined findings assessment and plan.  Sullivan Lone MD MS

## 2020-06-27 ENCOUNTER — Other Ambulatory Visit: Payer: Self-pay

## 2020-06-27 DIAGNOSIS — C8338 Diffuse large B-cell lymphoma, lymph nodes of multiple sites: Secondary | ICD-10-CM | POA: Diagnosis not present

## 2020-06-27 DIAGNOSIS — Z5111 Encounter for antineoplastic chemotherapy: Secondary | ICD-10-CM | POA: Diagnosis not present

## 2020-06-27 LAB — CBC WITH DIFFERENTIAL/PLATELET
Abs Immature Granulocytes: 0.07 10*3/uL (ref 0.00–0.07)
Basophils Absolute: 0 10*3/uL (ref 0.0–0.1)
Basophils Relative: 0 %
Eosinophils Absolute: 0 10*3/uL (ref 0.0–0.5)
Eosinophils Relative: 0 %
HCT: 28.4 % — ABNORMAL LOW (ref 39.0–52.0)
Hemoglobin: 9.8 g/dL — ABNORMAL LOW (ref 13.0–17.0)
Immature Granulocytes: 1 %
Lymphocytes Relative: 14 %
Lymphs Abs: 0.7 10*3/uL (ref 0.7–4.0)
MCH: 30.4 pg (ref 26.0–34.0)
MCHC: 34.5 g/dL (ref 30.0–36.0)
MCV: 88.2 fL (ref 80.0–100.0)
Monocytes Absolute: 0.1 10*3/uL (ref 0.1–1.0)
Monocytes Relative: 3 %
Neutro Abs: 4.3 10*3/uL (ref 1.7–7.7)
Neutrophils Relative %: 82 %
Platelets: 356 10*3/uL (ref 150–400)
RBC: 3.22 MIL/uL — ABNORMAL LOW (ref 4.22–5.81)
RDW: 15.9 % — ABNORMAL HIGH (ref 11.5–15.5)
WBC: 5.3 10*3/uL (ref 4.0–10.5)
nRBC: 0 % (ref 0.0–0.2)

## 2020-06-27 LAB — COMPREHENSIVE METABOLIC PANEL
ALT: 30 U/L (ref 0–44)
AST: 19 U/L (ref 15–41)
Albumin: 3.3 g/dL — ABNORMAL LOW (ref 3.5–5.0)
Alkaline Phosphatase: 59 U/L (ref 38–126)
Anion gap: 7 (ref 5–15)
BUN: 16 mg/dL (ref 8–23)
CO2: 24 mmol/L (ref 22–32)
Calcium: 8.7 mg/dL — ABNORMAL LOW (ref 8.9–10.3)
Chloride: 105 mmol/L (ref 98–111)
Creatinine, Ser: 0.74 mg/dL (ref 0.61–1.24)
GFR, Estimated: >60 mL/min (ref >60)
Glucose, Bld: 91 mg/dL (ref 70–99)
Potassium: 3.4 mmol/L — ABNORMAL LOW (ref 3.5–5.1)
Sodium: 136 mmol/L (ref 135–145)
Total Bilirubin: 0.7 mg/dL (ref 0.3–1.2)
Total Protein: 5.6 g/dL — ABNORMAL LOW (ref 6.5–8.1)

## 2020-06-27 MED ORDER — PROCHLORPERAZINE MALEATE 10 MG PO TABS: ORAL_TABLET | ORAL | 2 refills | Status: DC

## 2020-06-27 MED ORDER — HEPARIN SOD (PORK) LOCK FLUSH 100 UNIT/ML IV SOLN
500.0000 [IU] | INTRAVENOUS | Status: DC
  Filled 2020-06-27: qty 5

## 2020-06-27 MED ORDER — CYCLOPHOSPHAMIDE CHEMO INJECTION 1 GM
750.0000 mg/m2 | Freq: Once | INTRAMUSCULAR | Status: AC
  Administered 2020-06-27: 1300 mg via INTRAVENOUS
  Filled 2020-06-27: qty 65

## 2020-06-27 MED ORDER — SODIUM CHLORIDE 0.9 % IV SOLN
Freq: Once | INTRAVENOUS | Status: AC
  Administered 2020-06-27: 36 mg via INTRAVENOUS
  Filled 2020-06-27: qty 8

## 2020-06-27 MED ORDER — LORAZEPAM 0.5 MG PO TABS: 0.5000 mg | ORAL_TABLET | Freq: Three times a day (TID) | ORAL | 0 refills | Status: DC | PRN

## 2020-06-27 MED ORDER — HEPARIN SOD (PORK) LOCK FLUSH 100 UNIT/ML IV SOLN
500.0000 [IU] | INTRAVENOUS | Status: DC | PRN
  Administered 2020-06-27: 500 [IU]

## 2020-06-27 MED ORDER — ACETAMINOPHEN-CODEINE 300-30 MG PO TABS: ORAL_TABLET | ORAL | 0 refills | Status: DC

## 2020-06-27 NOTE — Discharge Summary (Addendum)
Discharge Summary  Patient ID: Bryan [name] MRN: [id number] DOB/AGE: 03/02/1956 65 y.o.  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 ([place])   Anemia  Discharged Condition: good  Discharge Labs:    CBC    Component Value Date/Time   WBC 11.9 (H) 06/26/2020 0530   RBC 3.10 (L) 06/26/2020 0530   HGB 9.5 (L) 06/26/2020 0530   HGB 9.5 (L) 06/13/2020 0958   HCT 28.1 (L) 06/26/2020 0530   PLT 341 06/26/2020 0530   PLT 187 06/13/2020 0958   MCV 90.6 06/26/2020 0530   MCH 30.6 06/26/2020 0530   MCHC 33.8 06/26/2020 0530   RDW 16.4 (H) 06/26/2020 0530   LYMPHSABS 0.8 06/26/2020 0530   MONOABS 0.6 06/26/2020 0530   EOSABS 0.0 06/26/2020 0530   BASOSABS 0.0 06/26/2020 0530   CMP Latest Ref Rng & Units 06/26/2020 06/25/2020 06/24/2020  Glucose 70 - 99 mg/dL 93 105(H) 121(H)  BUN 8 - 23 mg/dL 16 16 15   Creatinine 0.61 - 1.24 mg/dL 0.68 0.70 0.73  Sodium 135 - 145 mmol/L 139 141 134(L)  Potassium 3.5 - 5.1 mmol/L 3.5 3.8 3.8  Chloride 98 - 111 mmol/L 107 109 103  CO2 22 - 32 mmol/L 23 22 19(L)  Calcium 8.9 - 10.3 mg/dL 8.7(L) 8.8(L) 8.7(L)  Total Protein 6.5 - 8.1 g/dL 5.6(L) 5.8(L) 5.9(L)  Total Bilirubin 0.3 - 1.2 mg/dL 0.5 0.5 0.5  Alkaline Phos 38 - 126 U/L 63 72 72  AST 15 - 41 U/L 22 19 17   ALT 0 - 44 U/L 27 24 25     Consults: None  Procedures: 06/24/2020-intrathecal injection of methotrexate by radiology  Disposition:  Discharge disposition: 01-Home or Self Care      Allergies as of 06/27/2020   No Known Allergies     Medication List    TAKE these medications   acetaminophen 325 MG tablet Commonly known as: TYLENOL Take 2 tablets (650 mg total) by mouth every 4 (four) hours as needed for mild pain.   Acetaminophen-Codeine 300-30 MG tablet TAKE 1 TABLET BY MOUTH EVERY 4 (FOUR) HOURS AS NEEDED FOR  MODERATE PAIN What changed: See the new instructions.   dexamethasone 4 MG tablet Commonly known as: DECADRON Take 1 tablet twice a day for 3 days starting the day after each cycle of chemo What changed:   how much to take  how to take this  when to take this   dronabinol 5 MG capsule Commonly known as: MARINOL Take 1 capsule (5 mg total) by mouth 2 (two) times daily before lunch and supper. What changed:   when to take this  reasons to take this   lidocaine-prilocaine cream Commonly known as: EMLA Apply 1 application topically as needed. Apply to port site   LORazepam 0.5 MG tablet Commonly known as: ATIVAN Take 1 tablet (0.5 mg total) by mouth every 8 (eight) hours as needed for anxiety.   ondansetron 8 MG tablet Commonly known as: ZOFRAN TAKE 1 TABLET BY MOUTH EVERY 8 HOURS AS NEEDED FOR NAUSEA OR VOMITING. What changed:   how to take this  when to take this  reasons to take this  additional instructions   polyethylene glycol 17 g packet Commonly known as: MIRALAX / GLYCOLAX Take 17 g by mouth daily as needed. What changed: when to take this   prochlorperazine 10 MG  tablet Commonly known as: COMPAZINE TAKE 1 TABLET BY MOUTH EVERY 6 HOURS AS NEEDED FOR NAUSEA OR VOMITING. What changed:   how much to take  how to take this  when to take this  reasons to take this  additional instructions   senna-docusate 8.6-50 MG tablet Commonly known as: Senokot-S Take 1 tablet by mouth 2 (two) times daily as needed for mild constipation.   sodium bicarbonate/sodium chloride Soln 1 application by Mouth Rinse route 4 (four) times daily.   Ziextenzo 6 MG/0.6ML injection Generic drug: pegfilgrastim-bmez Inject 0.6 mLs (6 mg total) into the skin See admin instructions. Inject 0.6 mLs (6 mg total) into the skin once for 1 dose. 24 hours after each cycle of EPOCH. To be administered by Home Health RN arranged by [facility]oInject 6 mg into the skin See admin  instructions.         HPI:  Bryan [name] is a 65 y.o. male who has been referred to [place] by Dr. Tammi Klippel for evaluation and management of lung mass.  About 6 months ago pt began having abdominal pain after eating. He saw several physicians who could not explain his symptoms.He then developedextreme discomfort in his chest and red stools. He is unsure if the color of his stools was from blood or diet. Pt then began experiencing left arm pain and pleuritic chest pain, which triggered his Chest CTAin October.CTA chest ruled out PE but showed bulky mediastinal and bilateral hilar adenopathy. He was referred to hematology oncology at [facility] and a PET scan and biopsy were ordered. There were significant delays in getting these tests completed. Bryan [name] then establish care here in [place], [place] with hopes of expediting his work-up. He underwent a PET scan on 04/08/2020 which showed intensely hypermetabolic bilateral neck, bilateral axillary, bilateral mediastinal, bilateral hilar, and retroperitoneal lymphadenopathy, numerous small hypermetabolic splenic masses. He then had an ultrasound-guided biopsy of a left supraclavicular lymph node which showed diffuse large B-cell lymphoma. MRI of the brain with and without contrast was performed on 04/18/2020 which showed no acute intracranial process and no evidence for metastatic disease. It was recommended for the patient to begin systemic chemotherapy with EPOCH-R.  A restaging PET scan 05/28/2020 revealed a marked interval response to therapy with resolution of almost all areas of increased FDG activity. No sign of progressive disease.  The patient was seen on the day of admission prior to cycle #4 of his chemotherapy.  On admission, he had mild, intermittent nausea overall controlled with Compazine.  No neuropathy symptoms. He reported a good appetite.His energy level was improving the week prior to admission.  The patient was  admitted for cycle #4 of his chemotherapy.  Hospital Course: Bryan [name] started his chemotherapy as planned on the day of admission.  He overall tolerated his chemotherapy well with the exception of grade 1 nausea.  No vomiting was reported this admission.  Nausea was overall well controlled with as needed antiemetics.  He did not develop any mucositis this admission.  He has some issues with constipation despite being on MiraLAX daily and Senokot S1 tablet twice a day.  His Senokot-S was increased to 2 tablets twice a day.  The patient received intrathecal methotrexate by radiology on 06/24/2020 and tolerated well overall.  He was started on Lovenox following the intrathecal methotrexate, but the patient declined to take the Lovenox.  He was ambulating well during this hospitalization.  The patient was seen on the morning of 06/27/2020 and was deemed to  be stable for discharge.  The patient will discharge to home once chemotherapy is complete.  Refills for lorazepam, Compazine, and Tylenol #3 were sent to his local pharmacy.  The patient will administer G-CSF at home on 06/29/2020 and will have labs, follow-up visit, and rituximab in our office on 06/30/2020.  Physical Exam Vitals reviewed.  Constitutional:      General: He is not in acute distress.    Appearance: Normal appearance.  HENT:     Head: Normocephalic and atraumatic.     Mouth/Throat:     Pharynx: Oropharynx is clear. No oropharyngeal exudate or posterior oropharyngeal erythema.  Eyes:     General: No scleral icterus.    Pupils: Pupils are equal, round, and reactive to light.  Cardiovascular:     Rate and Rhythm: Normal rate and regular rhythm.  Pulmonary:     Effort: Pulmonary effort is normal. No respiratory distress.     Breath sounds: Normal breath sounds.  Abdominal:     General: Abdomen is flat. Bowel sounds are normal. There is no distension.     Palpations: Abdomen is soft.  Musculoskeletal:        General: Normal range of  motion.  Skin:    General: Skin is warm and dry.     Findings: No rash.  Neurological:     General: No focal deficit present.     Mental Status: He is alert and oriented to person, place, and time. Mental status is at baseline.  Psychiatric:        Mood and Affect: Mood normal.        Behavior: Behavior normal.        Thought Content: Thought content normal.        Judgment: Judgment normal.    Discharge Instructions    Activity as tolerated - No restrictions   Complete by: As directed    Diet general   Complete by: As directed       Signed: Mikey [name] 06/27/2020, 8:55 AM    ADDENDUM  Patient was Personally and independently interviewed, examined and relevant elements of the discharge plan were reviewed in details and were discussed in details with Bryan Bussing DNP. The above documentation reflects our combined findings assessment and plan.  Sullivan Lone MD MS TT spent discharging patient >30 mins

## 2020-06-27 NOTE — Progress Notes (Signed)
Pt discharged home with spouse in stable condition. Discharge instructions given. Scripts sent to pharmacy of choice. No immediate questions or concerns at this time. Pt opted to ambulate off the unit.

## 2020-06-30 ENCOUNTER — Inpatient Hospital Stay: Payer: 59

## 2020-06-30 ENCOUNTER — Telehealth: Payer: Self-pay | Admitting: *Deleted

## 2020-06-30 ENCOUNTER — Other Ambulatory Visit: Payer: 59

## 2020-06-30 ENCOUNTER — Other Ambulatory Visit: Payer: Self-pay

## 2020-06-30 ENCOUNTER — Ambulatory Visit: Payer: 59 | Admitting: Hematology

## 2020-06-30 VITALS — BP 99/70 | HR 63 | Temp 97.6°F | Resp 16 | Wt 139.2 lb

## 2020-06-30 DIAGNOSIS — Z8546 Personal history of malignant neoplasm of prostate: Secondary | ICD-10-CM | POA: Diagnosis not present

## 2020-06-30 DIAGNOSIS — Z95828 Presence of other vascular implants and grafts: Secondary | ICD-10-CM

## 2020-06-30 DIAGNOSIS — Z7189 Other specified counseling: Secondary | ICD-10-CM

## 2020-06-30 DIAGNOSIS — C8338 Diffuse large B-cell lymphoma, lymph nodes of multiple sites: Secondary | ICD-10-CM

## 2020-06-30 DIAGNOSIS — C8598 Non-Hodgkin lymphoma, unspecified, lymph nodes of multiple sites: Secondary | ICD-10-CM | POA: Diagnosis present

## 2020-06-30 DIAGNOSIS — I700 Atherosclerosis of aorta: Secondary | ICD-10-CM | POA: Diagnosis not present

## 2020-06-30 DIAGNOSIS — Z5112 Encounter for antineoplastic immunotherapy: Secondary | ICD-10-CM | POA: Diagnosis not present

## 2020-06-30 LAB — CBC WITH DIFFERENTIAL (CANCER CENTER ONLY)
Abs Immature Granulocytes: 1 10*3/uL — ABNORMAL HIGH (ref 0.00–0.07)
Band Neutrophils: 4 %
Basophils Absolute: 0 10*3/uL (ref 0.0–0.1)
Basophils Relative: 0 %
Eosinophils Absolute: 0 10*3/uL (ref 0.0–0.5)
Eosinophils Relative: 0 %
HCT: 27.3 % — ABNORMAL LOW (ref 39.0–52.0)
Hemoglobin: 9.6 g/dL — ABNORMAL LOW (ref 13.0–17.0)
Lymphocytes Relative: 0 %
Lymphs Abs: 0 10*3/uL — ABNORMAL LOW (ref 0.7–4.0)
MCH: 30.7 pg (ref 26.0–34.0)
MCHC: 35.2 g/dL (ref 30.0–36.0)
MCV: 87.2 fL (ref 80.0–100.0)
Metamyelocytes Relative: 1 %
Monocytes Absolute: 0 10*3/uL — ABNORMAL LOW (ref 0.1–1.0)
Monocytes Relative: 0 %
Neutro Abs: 94.1 10*3/uL — ABNORMAL HIGH (ref 1.7–7.7)
Neutrophils Relative %: 95 %
Platelet Count: 329 10*3/uL (ref 150–400)
RBC: 3.13 MIL/uL — ABNORMAL LOW (ref 4.22–5.81)
RDW: 16.2 % — ABNORMAL HIGH (ref 11.5–15.5)
WBC Count: 95 10*3/uL (ref 4.0–10.5)
nRBC: 0 % (ref 0.0–0.2)

## 2020-06-30 LAB — CMP (CANCER CENTER ONLY)
ALT: 27 U/L (ref 0–44)
AST: 13 U/L — ABNORMAL LOW (ref 15–41)
Albumin: 3.6 g/dL (ref 3.5–5.0)
Alkaline Phosphatase: 81 U/L (ref 38–126)
Anion gap: 11 (ref 5–15)
BUN: 16 mg/dL (ref 8–23)
CO2: 22 mmol/L (ref 22–32)
Calcium: 9 mg/dL (ref 8.9–10.3)
Chloride: 102 mmol/L (ref 98–111)
Creatinine: 0.74 mg/dL (ref 0.61–1.24)
GFR, Estimated: >60 mL/min (ref >60)
Glucose, Bld: 147 mg/dL — ABNORMAL HIGH (ref 70–99)
Potassium: 3.6 mmol/L (ref 3.5–5.1)
Sodium: 135 mmol/L (ref 135–145)
Total Bilirubin: 0.4 mg/dL (ref 0.3–1.2)
Total Protein: 5.9 g/dL — ABNORMAL LOW (ref 6.5–8.1)

## 2020-06-30 MED ORDER — METHYLPREDNISOLONE SODIUM SUCC 125 MG IJ SOLR
INTRAMUSCULAR | Status: AC
  Filled 2020-06-30: qty 2

## 2020-06-30 MED ORDER — DIPHENHYDRAMINE HCL 50 MG/ML IJ SOLN
INTRAMUSCULAR | Status: AC
  Filled 2020-06-30: qty 1

## 2020-06-30 MED ORDER — METHYLPREDNISOLONE SODIUM SUCC 125 MG IJ SOLR
80.0000 mg | Freq: Once | INTRAMUSCULAR | Status: AC
  Administered 2020-06-30: 80 mg via INTRAVENOUS

## 2020-06-30 MED ORDER — FAMOTIDINE IN NACL 20-0.9 MG/50ML-% IV SOLN
20.0000 mg | Freq: Once | INTRAVENOUS | Status: AC
  Administered 2020-06-30: 20 mg via INTRAVENOUS

## 2020-06-30 MED ORDER — SODIUM CHLORIDE 0.9 % IV SOLN
Freq: Once | INTRAVENOUS | Status: AC
  Filled 2020-06-30: qty 250

## 2020-06-30 MED ORDER — DIPHENHYDRAMINE HCL 25 MG PO CAPS
50.0000 mg | ORAL_CAPSULE | Freq: Once | ORAL | Status: AC
  Administered 2020-06-30: 50 mg via ORAL

## 2020-06-30 MED ORDER — FAMOTIDINE IN NACL 20-0.9 MG/50ML-% IV SOLN
INTRAVENOUS | Status: AC
  Filled 2020-06-30: qty 50

## 2020-06-30 MED ORDER — SODIUM CHLORIDE 0.9% FLUSH
10.0000 mL | INTRAVENOUS | Status: DC | PRN
  Administered 2020-06-30: 10 mL
  Filled 2020-06-30: qty 10

## 2020-06-30 MED ORDER — ACETAMINOPHEN 325 MG PO TABS
ORAL_TABLET | ORAL | Status: AC
  Filled 2020-06-30: qty 2

## 2020-06-30 MED ORDER — DIPHENHYDRAMINE HCL 25 MG PO CAPS
ORAL_CAPSULE | ORAL | Status: AC
  Filled 2020-06-30: qty 2

## 2020-06-30 MED ORDER — MONTELUKAST SODIUM 10 MG PO TABS
10.0000 mg | ORAL_TABLET | Freq: Once | ORAL | Status: AC
  Administered 2020-06-30: 10 mg via ORAL

## 2020-06-30 MED ORDER — HEPARIN SOD (PORK) LOCK FLUSH 100 UNIT/ML IV SOLN
500.0000 [IU] | Freq: Once | INTRAVENOUS | Status: AC | PRN
  Administered 2020-06-30: 500 [IU]
  Filled 2020-06-30: qty 5

## 2020-06-30 MED ORDER — SODIUM CHLORIDE 0.9% FLUSH
10.0000 mL | Freq: Once | INTRAVENOUS | Status: AC
  Administered 2020-06-30: 10 mL
  Filled 2020-06-30: qty 10

## 2020-06-30 MED ORDER — SODIUM CHLORIDE 0.9 % IV SOLN
375.0000 mg/m2 | Freq: Once | INTRAVENOUS | Status: AC
  Administered 2020-06-30: 600 mg via INTRAVENOUS
  Filled 2020-06-30: qty 50

## 2020-06-30 MED ORDER — MONTELUKAST SODIUM 10 MG PO TABS
ORAL_TABLET | ORAL | Status: AC
  Filled 2020-06-30: qty 1

## 2020-06-30 MED ORDER — ACETAMINOPHEN 325 MG PO TABS
650.0000 mg | ORAL_TABLET | Freq: Once | ORAL | Status: AC
  Administered 2020-06-30: 650 mg via ORAL

## 2020-06-30 MED ORDER — METHYLPREDNISOLONE SODIUM SUCC 125 MG IJ SOLR: 80.0000 mg | Freq: Every day | INTRAMUSCULAR | Status: DC

## 2020-06-30 NOTE — Telephone Encounter (Signed)
Notified by Jay/lab [phone] WBC [id number] Dr. Irene Limbo notified [phone] Information acknowledged/no orders received

## 2020-06-30 NOTE — Patient Instructions (Signed)
[facility] Discharge Instructions for Patients Receiving Chemotherapy  Today you received the following chemotherapy agents:  Rituxan.  To help prevent nausea and vomiting after your treatment, we encourage you to take your nausea medication as directed.   If you develop nausea and vomiting that is not controlled by your nausea medication, call the clinic.   BELOW ARE SYMPTOMS THAT SHOULD BE REPORTED IMMEDIATELY:  *FEVER GREATER THAN 100.5 F  *CHILLS WITH OR WITHOUT FEVER  NAUSEA AND VOMITING THAT IS NOT CONTROLLED WITH YOUR NAUSEA MEDICATION  *UNUSUAL SHORTNESS OF BREATH  *UNUSUAL BRUISING OR BLEEDING  TENDERNESS IN MOUTH AND THROAT WITH OR WITHOUT PRESENCE OF ULCERS  *URINARY PROBLEMS  *BOWEL PROBLEMS  UNUSUAL RASH Items with * indicate a potential emergency and should be followed up as soon as possible.  Feel free to call the clinic should you have any questions or concerns. The clinic phone number is [phone].  Please show the CHEMO ALERT CARD at check-in to the Emergency Department and triage nurse.   

## 2020-07-03 ENCOUNTER — Telehealth: Payer: Self-pay | Admitting: *Deleted

## 2020-07-03 NOTE — Telephone Encounter (Signed)
Wife called. They are at [employer]. Patient is pale, cold and feels weak. She states his gums are pale and he says he does not feel well.  Dr. Irene Limbo informed. Per Dr. Irene Limbo - will see in AM at 8:40 with lab appt before. Advise to go to ED for short of breath or Chest pain. Contacted wife - she verbalized understanding of instructions and appt times.  Schedule message sent

## 2020-07-03 NOTE — Progress Notes (Incomplete)
HEMATOLOGY/ONCOLOGY CONSULTATION NOTE  Date of Service: 07/03/2020  Patient Care Team: System, Provider Not In as PCP - General  CHIEF COMPLAINTS/PURPOSE OF CONSULTATION:  mx of large B cell lymphoma  HISTORY OF PRESENTING ILLNESS:  Bryan [name] is a wonderful 65 y.o. male who has been referred to [place] by Dr. Tammi Klippel for evaluation and management of lung mass. Pt is accompanied today by his wife. The pt reports that he is doing well overall.   The pt reports that he was diagnosed with low-grade Prostate Cancer in 2013. Pt had no symptoms, but got a check up after his father was diagnosed with Prostate Cancer. He was only treated with cryoablation. He also has a reducible, right inguinal hernia. Pt denies any other medical conditions or chronic medications. He does take a daily fish oil and multivitamin. He has NKDA.   About 6 months ago pt began having abdominal pain after eating. He saw several physicians who could not explain his symptoms. Last month he began having extreme discomfort in his chest and red stools. He is unsure if the color of his stools was from blood or diet. Pt then began experiencing left arm pain and pleuritic chest pain, which triggered his Chest CT in October.  In the last few nights pt has had roaming headaches that are improved with OTC Tylenol. Pt has lost 4-5 pounds recently, but attributes this to retiring and running more. Pt is currently taking two Oxycodone twice per day, which is controlling his discomfort. His wife also notes that the pt appears more fatigued than usual and is napping more. Pt felt poorly for 1 week after his second COVID19 vaccine, which he recived in March. Pt received the [place] vaccine, but waiting to receive the booster.   Of note prior to the patient's visit today, pt has had PET/CT ([id number]) completed on 04/08/2020 with results revealing "1. Intensely hypermetabolic bilateral neck, bilateral axillary, bilateral mediastinal,  bilateral hilar and retroperitoneal lymphadenopathy. Numerous small hypermetabolic splenic masses. Findings are compatible with malignancy, favoring lymphoproliferative disorder. The most accessible pathologic lymph nodes for potential percutaneous biopsy are likely within the left axilla and left supraclavicular neck."   Most recent lab results (03/31/2020) of CBC is as follows: all values are WNL except for HCT at 41.2, MPV at 9.7, Neutro Rel at 70.3, Immature Gran Rel at 0.4, Lymphs Rel at 15.8, Mono Rel at 10.3. 03/31/2020 D-Dimer at 0.51  On review of systems, pt reports headaches, chest pain, improved diarrhea, fatigue, mild productive cough, abdominal fullness and denies fevers, chills, night sweats, unexpected weight loss, low appetite, SOB, testicular pain/swelling, leg swelling, rash and any other symptoms.   On PMHx the pt reports Prostate Cancer, Cryotherapy, Inguinal hernia. 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 On Family Hx the pt reports a father with Prostate Cancer and a mother with Esophageal and Bladder Cancer (was a smoker).   INTERVAL HISTORY: Bryan [name] is a wonderful 65 y.o. male who is here for evaluation and management of newly diagnosed Large B-cell lymphoma. We are joined today by his wife via phone. The patient's last visit with [place] was on 06/13/2020. The pt reports that he is doing well overall. The pt was visited by Dr. Irene Limbo in the hospital on 06/26/2020. He is here today for toxicity check prior to C4D1.  The pt reports ***  Lab results today 07/04/2020 of CBC w/diff and CMP is as follows: all values are WNL  except for ***  On review of systems, pt reports *** and denies *** and any other symptoms.   MEDICAL HISTORY:  Past Medical History:  Diagnosis Date  . Prostate cancer [facility])     SURGICAL HISTORY: Past Surgical History:  Procedure Laterality Date  . CRYOTHERAPY    . IR IMAGING GUIDED PORT INSERTION  05/08/2020  . PROSTATE  SURGERY      SOCIAL HISTORY: Social History   Socioeconomic History  . Marital status: Married    Spouse name: 1  . Number of children: Not on file  . Years of education: Not on file  . Highest education level: Not on file  Occupational History  . Occupation: Futures trader: [employer]    Comment: medical leave  Tobacco Use  . Smoking status: Never Smoker  . Smokeless tobacco: Never Used  Vaping Use  . Vaping Use: Never used  Substance and Sexual Activity  . Alcohol use: Not Currently  . Drug use: Never  . Sexual activity: Yes  Other Topics Concern  . Not on file  Social History Narrative  . Not on file   Social Determinants of Health   Financial Resource Strain: Not on file  Food Insecurity: Not on file  Transportation Needs: Not on file  Physical Activity: Not on file  Stress: Not on file  Social Connections: Not on file  Intimate Partner Violence: Not on file    FAMILY HISTORY: Family History  Problem Relation Age of Onset  . Bladder Cancer Mother   . Esophageal cancer Mother   . Prostate cancer Father   . Prostate cancer Paternal Uncle     ALLERGIES:  has No Known Allergies.  MEDICATIONS:  Current Outpatient Medications  Medication Sig Dispense Refill  . acetaminophen (TYLENOL) 325 MG tablet Take 2 tablets (650 mg total) by mouth every 4 (four) hours as needed for mild pain.    . Acetaminophen-Codeine 300-30 MG tablet TAKE 1 TABLET BY MOUTH EVERY 4 (FOUR) HOURS AS NEEDED FOR MODERATE PAIN 30 tablet 0  . dexamethasone (DECADRON) 4 MG tablet Take 1 tablet twice a day for 3 days starting the day after each cycle of chemo (Patient taking differently: Take 4 mg by mouth See admin instructions. Take 1 tablet twice a day for 3 days starting the day after each cycle of chemo) 20 tablet 1  . dronabinol (MARINOL) 5 MG capsule Take 1 capsule (5 mg total) by mouth 2 (two) times daily before lunch and supper. (Patient taking differently: Take 5 mg by mouth 2  (two) times daily as needed (appetite).) 60 capsule 0  . lidocaine-prilocaine (EMLA) cream Apply 1 application topically as needed. Apply to port site 30 g 0  . LORazepam (ATIVAN) 0.5 MG tablet Take 1 tablet (0.5 mg total) by mouth every 8 (eight) hours as needed for anxiety. 30 tablet 0  . ondansetron (ZOFRAN) 8 MG tablet TAKE 1 TABLET BY MOUTH EVERY 8 HOURS AS NEEDED FOR NAUSEA OR VOMITING. (Patient taking differently: Take by mouth every 8 (eight) hours as needed for nausea or vomiting.) 30 tablet 0  . pegfilgrastim-bmez (ZIEXTENZO) 6 MG/0.6ML injection Inject 0.6 mLs (6 mg total) into the skin See admin instructions. Inject 0.6 mLs (6 mg total) into the skin once for 1 dose. 24 hours after each cycle of EPOCH. To be administered by Home Health RN arranged by [facility]ject 6 mg into the skin See admin instructions. 0.6 mL 4  . polyethylene glycol (MIRALAX /  GLYCOLAX) 17 g packet Take 17 g by mouth daily as needed. (Patient taking differently: Take 17 g by mouth daily.) 14 each 0  . prochlorperazine (COMPAZINE) 10 MG tablet TAKE 1 TABLET BY MOUTH EVERY 6 HOURS AS NEEDED FOR NAUSEA OR VOMITING. 30 tablet 2  . senna-docusate (SENOKOT-S) 8.6-50 MG tablet Take 1 tablet by mouth 2 (two) times daily as needed for mild constipation.    . Sodium Chloride-Sodium Bicarb (SODIUM BICARBONATE/SODIUM CHLORIDE) SOLN 1 application by Mouth Rinse route 4 (four) times daily. (Patient not taking: No sig reported)     No current facility-administered medications for this visit.    REVIEW OF SYSTEMS:   10 Point review of Systems was done is negative except as noted above.  PHYSICAL EXAMINATION: ECOG PERFORMANCE STATUS: 1 - Symptomatic but completely ambulatory  . There were no vitals filed for this visit. There were no vitals filed for this visit. .There is no height or weight on file to calculate BMI.   *** GENERAL:alert, in no acute distress and comfortable SKIN: no acute rashes, no significant  lesions EYES: conjunctiva are pink and non-injected, sclera anicteric OROPHARYNX: MMM, no exudates, no oropharyngeal erythema or ulceration NECK: supple, no JVD LYMPH:  no palpable lymphadenopathy in the cervical, axillary or inguinal regions LUNGS: clear to auscultation b/l with normal respiratory effort HEART: regular rate & rhythm ABDOMEN:  normoactive bowel sounds , non tender, not distended. Extremity: no pedal edema PSYCH: alert & oriented x 3 with fluent speech NEURO: no focal motor/sensory deficits   LABORATORY DATA:  I have reviewed the data as listed  . CBC Latest Ref Rng & Units 06/30/2020 06/27/2020 06/26/2020  WBC 4.0 - 10.5 K/uL 95.0(HH) 5.3 11.9(H)  Hemoglobin 13.0 - 17.0 g/dL 9.6(L) 9.8(L) 9.5(L)  Hematocrit 39.0 - 52.0 % 27.3(L) 28.4(L) 28.1(L)  Platelets 150 - 400 K/uL 329 356 341   . CMP Latest Ref Rng & Units 06/30/2020 06/27/2020 06/26/2020  Glucose 70 - 99 mg/dL 147(H) 91 93  BUN 8 - 23 mg/dL _0 Creatinine 0.61 - 1.24 mg/dL 0.74 0.74 0.68  Sodium 135 - 145 mmol/L 135 136 139  Potassium 3.5 - 5.1 mmol/L 3.6 3.4(L) 3.5  Chloride 98 - 111 mmol/L 102 105 107  CO2 22 - 32 mmol/L _1 Calcium 8.9 - 10.3 mg/dL 9.0 8.7(L) 8.7(L)  Total Protein 6.5 - 8.1 g/dL 5.9(L) 5.6(L) 5.6(L)  Total Bilirubin 0.3 - 1.2 mg/dL 0.4 0.7 0.5  Alkaline Phos 38 - 126 U/L 81 59 63  AST 15 - 41 U/L 13(L) 19 22  ALT 0 - 44 U/L _2 04/10/2020 FISH Analysis ([id number]):   04/10/2020 Left Cervical Lymph Node Bx ([id number]):    RADIOGRAPHIC STUDIES: I have personally reviewed the radiological images as listed and agreed with the findings in the report. DG FLUORO GUIDE LUMBAR PUNCTURE  Result Date: 06/24/2020 CLINICAL DATA:  65 year old male with history of diffuse large B-cell lymphoma. EXAM: FLUOROSCOPICALLY GUIDED LUMBAR PUNCTURE FOR INTRATHECAL CHEMOTHERAPY TECHNIQUE: Informed consent was obtained from the patient prior to the procedure, including potential  complications of headache, allergy, and pain. A 'time out' was performed. With the patient prone, the lower back was prepped with Betadine. 1% Lidocaine was used for local anesthesia. Lumbar puncture was performed at the L2-L3 using a 20 gauge needle with return of blood-tinged CSF. Opening pressure was 11.5 cm of water. A total of 7 mL of CSF was collected and sent to the  laboratory for testing per orders of the primary medical team. 12 mg of methotrexate was injected into the subarachnoid space. The patient tolerated the procedure well without apparent complication. FLUOROSCOPY TIME:  0.7 minutes IMPRESSION: Intrathecal injection of chemotherapy without complication Electronically Signed   By: Vinnie Langton M.D.   On: 06/24/2020 12:37    ASSESSMENT & PLAN:   1) Recently diagnosed advanced stage IIIA Large B cell lymphoma - activated B cell type 04/08/2020 PET/CT ([id number]) revealed "1. Intensely hypermetabolic bilateral neck, bilateral axillary, bilateral mediastinal, bilateral hilar and retroperitoneal lymphadenopathy. Numerous small hypermetabolic splenic masses. Findings are compatible with malignancy, favoring lymphoproliferative disorder." 04/10/2020 Left Cervical Lymph Node Bx ([id number]) revealed "Diffuse large B-cell lymphoma. KI-67 is 80-90%" FISH panel neg for double/triplet hit lymphoma PET/CT 12/22 "1. Marked interval response to therapy with resolution of nearly all areas of increased FDG uptake compared to the previous study. Most areas with residual uptake Deauville category 3. Subcarinal and RIGHT thoracic inlet with uptake slightly greater than liver, technically Deauville category 4 as described. Maximum SUV on today's study is 4.3 in the subcarinal region. 2. No new signs of disease. 3. Diffuse marrow activity presumably related to marrow stimulation. Suggest attention on follow-up. Aortic Atherosclerosis (ICD10-I70.0)."   PLAN: -Discussed pt labwork today, 07/04/2020;  ***  -The pt has no prohibitive toxicities from continuing C4 EPOCH-R. Plan to continue through six cycles. -Plan to begin Intrathecal Methotrexate for CNS prophylaxis on C4D2. -Reviewed prior PET scans. -Continue Marinol 5 mg BID -Will see back on C4D1. ***   FOLLOW UP: ***   The total time spent in the appointment was *** minutes and more than 50% was on counseling and direct patient cares.  All of the patient's questions were answered with apparent satisfaction. The patient knows to call the clinic with any problems, questions or concerns.    Sullivan Lone MD Gene Autry AAHIVMS [facility] Hematology/Oncology Physician [facility]  (Office):       [phone] (Work cell):  [phone] (Fax):           [phone]  07/03/2020 5:25 PM  I, Reinaldo Raddle, am acting as scribe for Dr. Sullivan Lone, MD.

## 2020-07-04 ENCOUNTER — Telehealth: Payer: Self-pay

## 2020-07-04 ENCOUNTER — Other Ambulatory Visit: Payer: 59

## 2020-07-04 ENCOUNTER — Other Ambulatory Visit: Payer: Self-pay | Admitting: Oncology

## 2020-07-04 ENCOUNTER — Ambulatory Visit: Payer: 59 | Admitting: Hematology

## 2020-07-04 DIAGNOSIS — C8598 Non-Hodgkin lymphoma, unspecified, lymph nodes of multiple sites: Secondary | ICD-10-CM

## 2020-07-04 NOTE — Telephone Encounter (Signed)
Contacted patient since he did not show for his appt today. He stated that he got a good nights sleep and feels much better and has energy today so he did not feel that he needed to be seen by Dr. Irene Limbo. Patient aware of appt's on 1/31, no further concerns.

## 2020-07-06 NOTE — Progress Notes (Signed)
HEMATOLOGY/ONCOLOGY CONSULTATION NOTE  Date of Service: 07/06/2020  Patient Care Team: System, Provider Not In as PCP - General  CHIEF COMPLAINTS/PURPOSE OF CONSULTATION:  mx of large B cell lymphoma  HISTORY OF PRESENTING ILLNESS:  Bryan [name] is a wonderful 65 y.o. male who has been referred to [place] by Dr. Tammi Klippel for evaluation and management of lung mass. Pt is accompanied today by his wife. The pt reports that he is doing well overall.   The pt reports that he was diagnosed with low-grade Prostate Cancer in 2013. Pt had no symptoms, but got a check up after his father was diagnosed with Prostate Cancer. He was only treated with cryoablation. He also has a reducible, right inguinal hernia. Pt denies any other medical conditions or chronic medications. He does take a daily fish oil and multivitamin. He has NKDA.   About 6 months ago pt began having abdominal pain after eating. He saw several physicians who could not explain his symptoms. Last month he began having extreme discomfort in his chest and red stools. He is unsure if the color of his stools was from blood or diet. Pt then began experiencing left arm pain and pleuritic chest pain, which triggered his Chest CT in October.  In the last few nights pt has had roaming headaches that are improved with OTC Tylenol. Pt has lost 4-5 pounds recently, but attributes this to retiring and running more. Pt is currently taking two Oxycodone twice per day, which is controlling his discomfort. His wife also notes that the pt appears more fatigued than usual and is napping more. Pt felt poorly for 1 week after his second COVID19 vaccine, which he recived in March. Pt received the [facility] vaccine, but waiting to receive the booster.   Of note prior to the patient's visit today, pt has had PET/CT ([id number]) completed on 04/08/2020 with results revealing "1. Intensely hypermetabolic bilateral neck, bilateral axillary, bilateral mediastinal,  bilateral hilar and retroperitoneal lymphadenopathy. Numerous small hypermetabolic splenic masses. Findings are compatible with malignancy, favoring lymphoproliferative disorder. The most accessible pathologic lymph nodes for potential percutaneous biopsy are likely within the left axilla and left supraclavicular neck."   Most recent lab results (03/31/2020) of CBC is as follows: all values are WNL except for HCT at 41.2, MPV at 9.7, Neutro Rel at 70.3, Immature Gran Rel at 0.4, Lymphs Rel at 15.8, Mono Rel at 10.3. 03/31/2020 D-Dimer at 0.51  On review of systems, pt reports headaches, chest pain, improved diarrhea, fatigue, mild productive cough, abdominal fullness and denies fevers, chills, night sweats, unexpected weight loss, low appetite, SOB, testicular pain/swelling, leg swelling, rash and any other symptoms.   On PMHx the pt reports Prostate Cancer, Cryotherapy, Inguinal hernia. 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 On Family Hx the pt reports a father with Prostate Cancer and a mother with Esophageal and Bladder Cancer (was a smoker).   INTERVAL HISTORY:  Bryan [name] is a wonderful 65 y.o. male who is here for evaluation and management of newly diagnosed Large B-cell lymphoma. We are joined today by his wife. The patient's last visit with [place] was on 06/13/2020. The pt reports that he is doing well overall.  The pt reports that he recently was experiencing body aches last week for three days. He has also been experiencing headaches currently for around the last 3-4 days. The pt's wife notes that he has been very fatigued, not sleeping as well, and has  not "been himself" lately. The pt notes his gums have been white recently that lasted for around a day. He has been eating and hydrating well.  The pt notes that he took a Lorazepam yesterday and this helped with his headache. He also experiences intermittent lightheadedness, dizziness when moving.  Lab results  today 07/07/2020 of CBC w/diff and CMP is as follows: all values are WNL except for WBC of 28.6K, RBC of 2.92, Hgb of 8.9, HCT of 26.2, RDW of 16.9, nRBC of 0.4, Glucose of 107, Total Protein of 5.9, Albumin of 3.3, Total Bilirubin of 0.2.  On review of systems, pt reports body aches, headaches and denies back pain, neck stiffness, change in vision, numbness/tingling in hands/feet, changes in speech, leg swelling and any other symptoms.  MEDICAL HISTORY:  Past Medical History:  Diagnosis Date  . Prostate cancer [facility])     SURGICAL HISTORY: Past Surgical History:  Procedure Laterality Date  . CRYOTHERAPY    . IR IMAGING GUIDED PORT INSERTION  05/08/2020  . PROSTATE SURGERY      SOCIAL HISTORY: Social History   Socioeconomic History  . Marital status: Married    Spouse name: 1  . Number of children: Not on file  . Years of education: Not on file  . Highest education level: Not on file  Occupational History  . Occupation: Futures trader: [employer]    Comment: medical leave  Tobacco Use  . Smoking status: Never Smoker  . Smokeless tobacco: Never Used  Vaping Use  . Vaping Use: Never used  Substance and Sexual Activity  . Alcohol use: Not Currently  . Drug use: Never  . Sexual activity: Yes  Other Topics Concern  . Not on file  Social History Narrative  . Not on file   Social Determinants of Health   Financial Resource Strain: Not on file  Food Insecurity: Not on file  Transportation Needs: Not on file  Physical Activity: Not on file  Stress: Not on file  Social Connections: Not on file  Intimate Partner Violence: Not on file    FAMILY HISTORY: Family History  Problem Relation Age of Onset  . Bladder Cancer Mother   . Esophageal cancer Mother   . Prostate cancer Father   . Prostate cancer Paternal Uncle     ALLERGIES:  has No Known Allergies.  MEDICATIONS:  Current Outpatient Medications  Medication Sig Dispense Refill  . acetaminophen (TYLENOL)  325 MG tablet Take 2 tablets (650 mg total) by mouth every 4 (four) hours as needed for mild pain.    . Acetaminophen-Codeine 300-30 MG tablet TAKE 1 TABLET BY MOUTH EVERY 4 (FOUR) HOURS AS NEEDED FOR MODERATE PAIN 30 tablet 0  . dexamethasone (DECADRON) 4 MG tablet Take 1 tablet twice a day for 3 days starting the day after each cycle of chemo (Patient taking differently: Take 4 mg by mouth See admin instructions. Take 1 tablet twice a day for 3 days starting the day after each cycle of chemo) 20 tablet 1  . dronabinol (MARINOL) 5 MG capsule Take 1 capsule (5 mg total) by mouth 2 (two) times daily before lunch and supper. (Patient taking differently: Take 5 mg by mouth 2 (two) times daily as needed (appetite).) 60 capsule 0  . lidocaine-prilocaine (EMLA) cream Apply 1 application topically as needed. Apply to port site 30 g 0  . LORazepam (ATIVAN) 0.5 MG tablet Take 1 tablet (0.5 mg total) by mouth every 8 (eight) hours  as needed for anxiety. 30 tablet 0  . ondansetron (ZOFRAN) 8 MG tablet TAKE 1 TABLET BY MOUTH EVERY 8 HOURS AS NEEDED FOR NAUSEA OR VOMITING. (Patient taking differently: Take by mouth every 8 (eight) hours as needed for nausea or vomiting.) 30 tablet 0  . pegfilgrastim-bmez (ZIEXTENZO) 6 MG/0.6ML injection Inject 0.6 mLs (6 mg total) into the skin See admin instructions. Inject 0.6 mLs (6 mg total) into the skin once for 1 dose. 24 hours after each cycle of EPOCH. To be administered by Home Health RN arranged by [facility] 6 mg into the skin See admin instructions. 0.6 mL 4  . polyethylene glycol (MIRALAX / GLYCOLAX) 17 g packet Take 17 g by mouth daily as needed. (Patient taking differently: Take 17 g by mouth daily.) 14 each 0  . prochlorperazine (COMPAZINE) 10 MG tablet TAKE 1 TABLET BY MOUTH EVERY 6 HOURS AS NEEDED FOR NAUSEA OR VOMITING. 30 tablet 2  . senna-docusate (SENOKOT-S) 8.6-50 MG tablet Take 1 tablet by mouth 2 (two) times daily as needed for mild constipation.    .  Sodium Chloride-Sodium Bicarb (SODIUM BICARBONATE/SODIUM CHLORIDE) SOLN 1 application by Mouth Rinse route 4 (four) times daily. (Patient not taking: No sig reported)     No current facility-administered medications for this visit.    REVIEW OF SYSTEMS:   10 Point review of Systems was done is negative except as noted above.  PHYSICAL EXAMINATION: ECOG PERFORMANCE STATUS: 1 - Symptomatic but completely ambulatory  . Vitals:   07/07/20 1013  BP: 106/62  Pulse: 72  Resp: 18  Temp: 98.5 F (36.9 C)  SpO2: 100%   Filed Weights   07/07/20 1013  Weight: 144 lb 12.8 oz (65.7 kg)   .Body mass index is 22.68 kg/m.   GENERAL:alert, in no acute distress and comfortable SKIN: no acute rashes, no significant lesions EYES: conjunctiva are pink and non-injected, sclera anicteric OROPHARYNX: MMM, no exudates, no oropharyngeal erythema or ulceration NECK: supple, no JVD LYMPH:  no palpable lymphadenopathy in the cervical, axillary or inguinal regions LUNGS: clear to auscultation b/l with normal respiratory effort HEART: regular rate & rhythm ABDOMEN:  normoactive bowel sounds , non tender, not distended. Extremity: no pedal edema PSYCH: alert & oriented x 3 with fluent speech NEURO: no focal motor/sensory deficits    LABORATORY DATA:  I have reviewed the data as listed  . CBC Latest Ref Rng & Units 07/07/2020 06/30/2020 06/27/2020  WBC 4.0 - 10.5 K/uL 28.6(H) 95.0(HH) 5.3  Hemoglobin 13.0 - 17.0 g/dL 8.9(L) 9.6(L) 9.8(L)  Hematocrit 39.0 - 52.0 % 26.2(L) 27.3(L) 28.4(L)  Platelets 150 - 400 K/uL 162 329 356   . CMP Latest Ref Rng & Units 07/07/2020 06/30/2020 06/27/2020  Glucose 70 - 99 mg/dL 107(H) 147(H) 91  BUN 8 - 23 mg/dL 8 16 16   Creatinine 0.61 - 1.24 mg/dL 0.73 0.74 0.74  Sodium 135 - 145 mmol/L 136 135 136  Potassium 3.5 - 5.1 mmol/L 4.3 3.6 3.4(L)  Chloride 98 - 111 mmol/L 104 102 105  CO2 22 - 32 mmol/L 26 22 24   Calcium 8.9 - 10.3 mg/dL 8.9 9.0 8.7(L)  Total  Protein 6.5 - 8.1 g/dL 5.9(L) 5.9(L) 5.6(L)  Total Bilirubin 0.3 - 1.2 mg/dL 0.2(L) 0.4 0.7  Alkaline Phos 38 - 126 U/L 91 81 59  AST 15 - 41 U/L 17 13(L) 19  ALT 0 - 44 U/L 21 27 30    04/10/2020 FISH Analysis ([id number]):   04/10/2020 Left Cervical Lymph Node  Bx ([id number]):    RADIOGRAPHIC STUDIES: I have personally reviewed the radiological images as listed and agreed with the findings in the report. DG FLUORO GUIDE LUMBAR PUNCTURE  Result Date: 06/24/2020 CLINICAL DATA:  65 year old male with history of diffuse large B-cell lymphoma. EXAM: FLUOROSCOPICALLY GUIDED LUMBAR PUNCTURE FOR INTRATHECAL CHEMOTHERAPY TECHNIQUE: Informed consent was obtained from the patient prior to the procedure, including potential complications of headache, allergy, and pain. A 'time out' was performed. With the patient prone, the lower back was prepped with Betadine. 1% Lidocaine was used for local anesthesia. Lumbar puncture was performed at the L2-L3 using a 20 gauge needle with return of blood-tinged CSF. Opening pressure was 11.5 cm of water. A total of 7 mL of CSF was collected and sent to the laboratory for testing per orders of the primary medical team. 12 mg of methotrexate was injected into the subarachnoid space. The patient tolerated the procedure well without apparent complication. FLUOROSCOPY TIME:  0.7 minutes IMPRESSION: Intrathecal injection of chemotherapy without complication Electronically Signed   By: Vinnie Langton M.D.   On: 06/24/2020 12:37    ASSESSMENT & PLAN:   1) Recently diagnosed advanced stage IIIA Large B cell lymphoma - activated B cell type 04/08/2020 PET/CT ([id number]) revealed "1. Intensely hypermetabolic bilateral neck, bilateral axillary, bilateral mediastinal, bilateral hilar and retroperitoneal lymphadenopathy. Numerous small hypermetabolic splenic masses. Findings are compatible with malignancy, favoring lymphoproliferative disorder." 04/10/2020 Left Cervical Lymph  Node Bx ([id number]) revealed "Diffuse large B-cell lymphoma. KI-67 is 80-90%" FISH panel neg for double/triplet hit lymphoma PET/CT 12/22 "1. Marked interval response to therapy with resolution of nearly all areas of increased FDG uptake compared to the previous study. Most areas with residual uptake Deauville category 3. Subcarinal and RIGHT thoracic inlet with uptake slightly greater than liver, technically Deauville category 4 as described. Maximum SUV on today's study is 4.3 in the subcarinal region. 2. No new signs of disease. 3. Diffuse marrow activity presumably related to marrow stimulation. Suggest attention on follow-up. Aortic Atherosclerosis (ICD10-I70.0)."   PLAN: -Discussed pt labwork today, 07/07/2020; Hgb lower than baseline, slightly anemic. -Discussed the pt of transfusion support for the next cycles. Will continue to monitor Hgb levels. -The pt has no prohibitive toxicities from continuing C5 EPOCH-R. Plan to continue through six cycles. -Will continue to monitor pt's symptoms. Will discuss prior regarding continuing Intrathecal Methotrexate for CNS prophylaxis on C5D2. Would not want to continue Intrathecal at expense of not being able to continue treatment. -Recommend pt continue to drink 48-64 oz water daily, eat well, walk 20-30 min daily, and get proper sleep. -Continue 2000 IU Vitamin D and Vitamin B complex once daily. -Continue Marinol 5 mg BID -Will see back on 07/25/2020.   FOLLOW UP: Outpatient Covid test on Saturday, 07/12/2020 Admit for inpatient [facility] chemotherapy from 07/14/2020 for 5 days. Outpatient Rituxan on 07/21/2020 Labs and MD visit on 07/25/2020    The total time spent in the appointment was 30 minutes and more than 50% was on counseling and direct patient cares and co-ordination of cares/chemotherapy.   All of the patient's questions were answered with apparent satisfaction. The patient knows to call the clinic with any problems, questions or  concerns.    Sullivan Lone MD [place] AAHIVMS [facility] Hematology/Oncology Physician [facility]  (Office):       [phone] (Work cell):  [phone] (Fax):           [phone]  07/06/2020 9:49 AM  I, Reinaldo Raddle, am acting  as scribe for Dr. Sullivan Lone, MD.    .I have reviewed the above documentation for accuracy and completeness, and I agree with the above. Brunetta Genera MD

## 2020-07-07 ENCOUNTER — Inpatient Hospital Stay: Payer: 59

## 2020-07-07 ENCOUNTER — Telehealth: Payer: Self-pay

## 2020-07-07 ENCOUNTER — Other Ambulatory Visit: Payer: Self-pay

## 2020-07-07 ENCOUNTER — Inpatient Hospital Stay (HOSPITAL_BASED_OUTPATIENT_CLINIC_OR_DEPARTMENT_OTHER): Payer: 59 | Admitting: Hematology

## 2020-07-07 VITALS — BP 106/62 | HR 72 | Temp 98.5°F | Resp 18 | Ht 67.0 in | Wt 144.8 lb

## 2020-07-07 DIAGNOSIS — C8598 Non-Hodgkin lymphoma, unspecified, lymph nodes of multiple sites: Secondary | ICD-10-CM

## 2020-07-07 DIAGNOSIS — Z95828 Presence of other vascular implants and grafts: Secondary | ICD-10-CM

## 2020-07-07 DIAGNOSIS — C8338 Diffuse large B-cell lymphoma, lymph nodes of multiple sites: Secondary | ICD-10-CM

## 2020-07-07 DIAGNOSIS — Z5112 Encounter for antineoplastic immunotherapy: Secondary | ICD-10-CM | POA: Diagnosis not present

## 2020-07-07 DIAGNOSIS — R110 Nausea: Secondary | ICD-10-CM

## 2020-07-07 DIAGNOSIS — Z23 Encounter for immunization: Secondary | ICD-10-CM | POA: Diagnosis not present

## 2020-07-07 LAB — CBC WITH DIFFERENTIAL (CANCER CENTER ONLY)
Abs Immature Granulocytes: 4.3 10*3/uL — ABNORMAL HIGH (ref 0.00–0.07)
Band Neutrophils: 25 %
Basophils Absolute: 0.6 10*3/uL — ABNORMAL HIGH (ref 0.0–0.1)
Basophils Relative: 2 %
Blasts: 2 %
Eosinophils Absolute: 0.9 10*3/uL — ABNORMAL HIGH (ref 0.0–0.5)
Eosinophils Relative: 3 %
HCT: 26.2 % — ABNORMAL LOW (ref 39.0–52.0)
Hemoglobin: 8.9 g/dL — ABNORMAL LOW (ref 13.0–17.0)
Lymphocytes Relative: 4 %
Lymphs Abs: 1.1 10*3/uL (ref 0.7–4.0)
MCH: 30.5 pg (ref 26.0–34.0)
MCHC: 34 g/dL (ref 30.0–36.0)
MCV: 89.7 fL (ref 80.0–100.0)
Metamyelocytes Relative: 8 %
Monocytes Absolute: 2 10*3/uL — ABNORMAL HIGH (ref 0.1–1.0)
Monocytes Relative: 7 %
Myelocytes: 4 %
Neutro Abs: 19.2 10*3/uL — ABNORMAL HIGH (ref 1.7–7.7)
Neutrophils Relative %: 42 %
Platelet Count: 162 10*3/uL (ref 150–400)
Promyelocytes Relative: 3 %
RBC: 2.92 MIL/uL — ABNORMAL LOW (ref 4.22–5.81)
RDW: 16.9 % — ABNORMAL HIGH (ref 11.5–15.5)
Smear Review: 1
WBC Count: 28.6 10*3/uL — ABNORMAL HIGH (ref 4.0–10.5)
nRBC: 0.4 % — ABNORMAL HIGH (ref 0.0–0.2)

## 2020-07-07 LAB — CMP (CANCER CENTER ONLY)
ALT: 21 U/L (ref 0–44)
AST: 17 U/L (ref 15–41)
Albumin: 3.3 g/dL — ABNORMAL LOW (ref 3.5–5.0)
Alkaline Phosphatase: 91 U/L (ref 38–126)
Anion gap: 6 (ref 5–15)
BUN: 8 mg/dL (ref 8–23)
CO2: 26 mmol/L (ref 22–32)
Calcium: 8.9 mg/dL (ref 8.9–10.3)
Chloride: 104 mmol/L (ref 98–111)
Creatinine: 0.73 mg/dL (ref 0.61–1.24)
GFR, Estimated: >60 mL/min (ref >60)
Glucose, Bld: 107 mg/dL — ABNORMAL HIGH (ref 70–99)
Potassium: 4.3 mmol/L (ref 3.5–5.1)
Sodium: 136 mmol/L (ref 135–145)
Total Bilirubin: 0.2 mg/dL — ABNORMAL LOW (ref 0.3–1.2)
Total Protein: 5.9 g/dL — ABNORMAL LOW (ref 6.5–8.1)

## 2020-07-07 MED ORDER — HEPARIN SOD (PORK) LOCK FLUSH 100 UNIT/ML IV SOLN
500.0000 [IU] | Freq: Once | INTRAVENOUS | Status: AC
  Administered 2020-07-07: 500 [IU]
  Filled 2020-07-07: qty 5

## 2020-07-07 MED ORDER — SODIUM CHLORIDE 0.9% FLUSH
10.0000 mL | Freq: Once | INTRAVENOUS | Status: AC
  Administered 2020-07-07: 10 mL
  Filled 2020-07-07: qty 10

## 2020-07-07 MED ORDER — ACETAMINOPHEN-CODEINE 300-30 MG PO TABS: ORAL_TABLET | ORAL | 0 refills | Status: DC

## 2020-07-07 MED ORDER — LORAZEPAM 0.5 MG PO TABS: 0.5000 mg | ORAL_TABLET | Freq: Three times a day (TID) | ORAL | 0 refills | Status: DC | PRN

## 2020-07-07 NOTE — Telephone Encounter (Signed)
TCT pt's wife [name] regarding refill for Lorazepam and Acetamenophen-codeine sent to [facility].

## 2020-07-10 ENCOUNTER — Encounter: Payer: Self-pay | Admitting: Hematology

## 2020-07-10 ENCOUNTER — Telehealth: Payer: Self-pay | Admitting: *Deleted

## 2020-07-10 NOTE — Telephone Encounter (Signed)
Scheduled for Monday2/7/22AM admissionper[place]in patient placement. Covid screen is scheduled for Saturday 07/12/20 Email sent to #[email or web address] Patient/wifeinformed of all times and locations via MyChart

## 2020-07-12 ENCOUNTER — Other Ambulatory Visit (HOSPITAL_COMMUNITY)
Admission: RE | Admit: 2020-07-12 | Discharge: 2020-07-12 | Disposition: A | Discharge status: Home / Self Care | Payer: 59 | Source: Ambulatory Visit | Attending: Hematology | Admitting: Hematology

## 2020-07-12 DIAGNOSIS — Z20822 Contact with and (suspected) exposure to covid-19: Secondary | ICD-10-CM | POA: Insufficient documentation

## 2020-07-12 DIAGNOSIS — Z01812 Encounter for preprocedural laboratory examination: Secondary | ICD-10-CM | POA: Insufficient documentation

## 2020-07-12 LAB — SARS CORONAVIRUS 2 (TAT 6-24 HRS): SARS Coronavirus 2: NEGATIVE

## 2020-07-14 ENCOUNTER — Inpatient Hospital Stay (HOSPITAL_COMMUNITY)
Admission: AD | Admit: 2020-07-14 | Discharge: 2020-07-18 | DRG: 847 | Disposition: A | Discharge status: Home / Self Care | Payer: 59 | Attending: Hematology | Admitting: Hematology

## 2020-07-14 ENCOUNTER — Other Ambulatory Visit: Payer: Self-pay

## 2020-07-14 ENCOUNTER — Encounter (HOSPITAL_COMMUNITY): Payer: Self-pay | Admitting: Hematology

## 2020-07-14 DIAGNOSIS — Z72820 Sleep deprivation: Secondary | ICD-10-CM

## 2020-07-14 DIAGNOSIS — R110 Nausea: Secondary | ICD-10-CM | POA: Diagnosis not present

## 2020-07-14 DIAGNOSIS — Z5111 Encounter for antineoplastic chemotherapy: Secondary | ICD-10-CM | POA: Diagnosis present

## 2020-07-14 DIAGNOSIS — Z7189 Other specified counseling: Secondary | ICD-10-CM

## 2020-07-14 DIAGNOSIS — Z20822 Contact with and (suspected) exposure to covid-19: Secondary | ICD-10-CM | POA: Diagnosis present

## 2020-07-14 DIAGNOSIS — G44209 Tension-type headache, unspecified, not intractable: Secondary | ICD-10-CM | POA: Diagnosis present

## 2020-07-14 DIAGNOSIS — C8598 Non-Hodgkin lymphoma, unspecified, lymph nodes of multiple sites: Secondary | ICD-10-CM

## 2020-07-14 DIAGNOSIS — D649 Anemia, unspecified: Secondary | ICD-10-CM | POA: Diagnosis not present

## 2020-07-14 DIAGNOSIS — C8338 Diffuse large B-cell lymphoma, lymph nodes of multiple sites: Secondary | ICD-10-CM | POA: Diagnosis present

## 2020-07-14 DIAGNOSIS — Z8546 Personal history of malignant neoplasm of prostate: Secondary | ICD-10-CM

## 2020-07-14 DIAGNOSIS — R519 Headache, unspecified: Secondary | ICD-10-CM

## 2020-07-14 DIAGNOSIS — Z95828 Presence of other vascular implants and grafts: Secondary | ICD-10-CM

## 2020-07-14 DIAGNOSIS — E876 Hypokalemia: Secondary | ICD-10-CM | POA: Diagnosis not present

## 2020-07-14 LAB — CBC WITH DIFFERENTIAL/PLATELET
Abs Immature Granulocytes: 2.71 10*3/uL — ABNORMAL HIGH (ref 0.00–0.07)
Basophils Absolute: 0.3 10*3/uL — ABNORMAL HIGH (ref 0.0–0.1)
Basophils Relative: 1 %
Eosinophils Absolute: 0.3 10*3/uL (ref 0.0–0.5)
Eosinophils Relative: 2 %
HCT: 29 % — ABNORMAL LOW (ref 39.0–52.0)
Hemoglobin: 9.6 g/dL — ABNORMAL LOW (ref 13.0–17.0)
Immature Granulocytes: 14 %
Lymphocytes Relative: 7 %
Lymphs Abs: 1.3 10*3/uL (ref 0.7–4.0)
MCH: 30.7 pg (ref 26.0–34.0)
MCHC: 33.1 g/dL (ref 30.0–36.0)
MCV: 92.7 fL (ref 80.0–100.0)
Monocytes Absolute: 1.5 10*3/uL — ABNORMAL HIGH (ref 0.1–1.0)
Monocytes Relative: 7 %
Neutro Abs: 13.7 10*3/uL — ABNORMAL HIGH (ref 1.7–7.7)
Neutrophils Relative %: 69 %
Platelets: 575 10*3/uL — ABNORMAL HIGH (ref 150–400)
RBC: 3.13 MIL/uL — ABNORMAL LOW (ref 4.22–5.81)
RDW: 17.2 % — ABNORMAL HIGH (ref 11.5–15.5)
WBC: 19.7 10*3/uL — ABNORMAL HIGH (ref 4.0–10.5)
nRBC: 0 % (ref 0.0–0.2)

## 2020-07-14 LAB — COMPREHENSIVE METABOLIC PANEL
ALT: 24 U/L (ref 0–44)
AST: 22 U/L (ref 15–41)
Albumin: 3.4 g/dL — ABNORMAL LOW (ref 3.5–5.0)
Alkaline Phosphatase: 87 U/L (ref 38–126)
Anion gap: 11 (ref 5–15)
BUN: 10 mg/dL (ref 8–23)
CO2: 25 mmol/L (ref 22–32)
Calcium: 9 mg/dL (ref 8.9–10.3)
Chloride: 102 mmol/L (ref 98–111)
Creatinine, Ser: 0.67 mg/dL (ref 0.61–1.24)
GFR, Estimated: >60 mL/min (ref >60)
Glucose, Bld: 95 mg/dL (ref 70–99)
Potassium: 4.3 mmol/L (ref 3.5–5.1)
Sodium: 138 mmol/L (ref 135–145)
Total Bilirubin: 0.5 mg/dL (ref 0.3–1.2)
Total Protein: 6.2 g/dL — ABNORMAL LOW (ref 6.5–8.1)

## 2020-07-14 LAB — LACTATE DEHYDROGENASE: LDH: 200 U/L — ABNORMAL HIGH (ref 98–192)

## 2020-07-14 MED ORDER — DRONABINOL 5 MG PO CAPS: 5.0000 mg | ORAL_CAPSULE | Freq: Two times a day (BID) | ORAL | Status: DC | PRN

## 2020-07-14 MED ORDER — SENNOSIDES-DOCUSATE SODIUM 8.6-50 MG PO TABS
1.0000 | ORAL_TABLET | Freq: Two times a day (BID) | ORAL | Status: DC
  Administered 2020-07-14 – 2020-07-17 (×5): 1 via ORAL
  Filled 2020-07-14 (×7): qty 1

## 2020-07-14 MED ORDER — SODIUM CHLORIDE 0.9 % IV SOLN
Freq: Once | INTRAVENOUS | Status: AC
  Administered 2020-07-14: 8 mg via INTRAVENOUS
  Filled 2020-07-14: qty 4

## 2020-07-14 MED ORDER — SODIUM CHLORIDE 0.9% FLUSH: 10.0000 mL | INTRAVENOUS | Status: DC | PRN

## 2020-07-14 MED ORDER — PREDNISONE 20 MG PO TABS
60.0000 mg | ORAL_TABLET | Freq: Every day | ORAL | Status: AC
  Administered 2020-07-14 – 2020-07-18 (×5): 60 mg via ORAL
  Filled 2020-07-14 (×5): qty 3

## 2020-07-14 MED ORDER — VINCRISTINE SULFATE CHEMO INJECTION 1 MG/ML
Freq: Once | INTRAVENOUS | Status: AC
  Filled 2020-07-14: qty 10

## 2020-07-14 MED ORDER — ACETAMINOPHEN 325 MG PO TABS: 650.0000 mg | ORAL_TABLET | ORAL | Status: DC | PRN

## 2020-07-14 MED ORDER — LIDOCAINE-PRILOCAINE 2.5-2.5 % EX CREA: 1.0000 "application " | TOPICAL_CREAM | CUTANEOUS | Status: DC | PRN

## 2020-07-14 MED ORDER — PROCHLORPERAZINE MALEATE 10 MG PO TABS: 10.0000 mg | ORAL_TABLET | Freq: Four times a day (QID) | ORAL | Status: DC | PRN

## 2020-07-14 MED ORDER — ONDANSETRON HCL 8 MG PO TABS: 8.0000 mg | ORAL_TABLET | Freq: Three times a day (TID) | ORAL | Status: DC | PRN

## 2020-07-14 MED ORDER — POLYETHYLENE GLYCOL 3350 17 G PO PACK
17.0000 g | PACK | Freq: Every day | ORAL | Status: DC
  Administered 2020-07-14 – 2020-07-17 (×3): 17 g via ORAL
  Filled 2020-07-14 (×4): qty 1

## 2020-07-14 MED ORDER — SODIUM CHLORIDE 0.9% FLUSH
10.0000 mL | Freq: Two times a day (BID) | INTRAVENOUS | Status: DC
  Administered 2020-07-14: 10 mL
  Administered 2020-07-14: 20 mL
  Administered 2020-07-15 – 2020-07-18 (×5): 10 mL

## 2020-07-14 MED ORDER — ACETAMINOPHEN-CODEINE #3 300-30 MG PO TABS
1.0000 | ORAL_TABLET | ORAL | Status: DC | PRN
Start: 2020-07-14 — End: 2020-07-18

## 2020-07-14 MED ORDER — LORAZEPAM 0.5 MG PO TABS
0.5000 mg | ORAL_TABLET | Freq: Three times a day (TID) | ORAL | Status: DC | PRN
  Administered 2020-07-14 – 2020-07-17 (×6): 0.5 mg via ORAL
  Filled 2020-07-14 (×6): qty 1

## 2020-07-14 MED ORDER — CHLORHEXIDINE GLUCONATE CLOTH 2 % EX PADS
6.0000 | MEDICATED_PAD | Freq: Every day | CUTANEOUS | Status: DC
  Administered 2020-07-14 – 2020-07-18 (×5): 6 via TOPICAL

## 2020-07-14 MED ORDER — SODIUM BICARBONATE/SODIUM CHLORIDE MOUTHWASH
1.0000 "application " | Freq: Four times a day (QID) | OROMUCOSAL | Status: DC
  Administered 2020-07-14 – 2020-07-18 (×10): 1 via OROMUCOSAL
  Filled 2020-07-14: qty 1000

## 2020-07-14 NOTE — H&P (Addendum)
[place]  [phone] Fax:([phone]) [place] H&P  Reason for Admission: Cycle #5 EPOCH-R  HPI: Bryan [name] is a wonderful 65 y.o. male who has been referred to [place] by Dr. Tammi Klippel for evaluation and management of lung mass.  About 6 months ago pt began having abdominal pain after eating. He saw several physicians who could not explain his symptoms.  He then developed extreme discomfort in his chest and red stools. He is unsure if the color of his stools was from blood or diet. Pt then began experiencing left arm pain and pleuritic chest pain, which triggered his Chest CTA in October.  CTA chest ruled out PE but showed bulky mediastinal and bilateral hilar adenopathy.  He was referred to hematology oncology at [facility] and a PET scan and biopsy were ordered.  There were significant delays in getting these tests completed.  Bryan [name] then establish care here in [place], [place] with hopes of expediting his work-up.  He underwent a PET scan on 04/08/2020 which showed intensely hypermetabolic bilateral neck, bilateral axillary, bilateral mediastinal, bilateral hilar, and retroperitoneal lymphadenopathy, numerous small hypermetabolic splenic masses.  He then had an ultrasound-guided biopsy of a left supraclavicular lymph node which showed diffuse large B-cell lymphoma.  MRI of the brain with and without contrast was performed on 04/18/2020 which showed no acute intracranial process and no evidence for metastatic disease.  It was recommended for the patient to begin systemic chemotherapy with EPOCH-R.   The patient is seen today for admission prior to cycle #5 of his chemotherapy.  His wife is at the bedside.  The patient has been having increased headaches almost daily for the past 1 to 2 weeks.  Denies vision changes or dizziness or any other associated symptoms with these.  He states that his headache has resolved today.  His wife is also noticed  some intermittent confusion.  However, he seems clearer today.  When asked to further characterize the confusion, she states that he was a bit "spacey."  He denies fevers, chills, mucositis, chest pain, shortness of breath, cough, abdominal pain, nausea, vomiting, constipation.  The patient states that he prefers not to have intrathecal chemo because he feels headaches are related to prior intrathecal chemo.  The patient is seen today for admission for cycle 5 of chemotherapy.  Past Medical History:  Diagnosis Date  . Prostate cancer [facility])     Past Surgical History:  Procedure Laterality Date  . CRYOTHERAPY    . IR IMAGING GUIDED PORT INSERTION  05/08/2020  . PROSTATE SURGERY         Family History  Problem Relation Age of Onset  . Bladder Cancer Mother   . Esophageal cancer Mother   . Prostate cancer Father   . Prostate cancer Paternal Uncle   Relation Age of Onset  . Bladder Cancer Mother   . Esophageal cancer Mother   . Prostate cancer Father   . Prostate cancer Paternal Uncle      Socioeconomic History  . Marital status: Married    Spouse name: 1  . Number of children: Not on file  . Years of education: Not on file  . Highest education level: Not on file  Occupational History  . Occupation: Futures trader: [employer]    Comment: medical leave  Tobacco Use  . Smoking status: Never Smoker  . Smokeless tobacco: Never Used  Vaping Use  . Vaping Use: Never used  Substance and Sexual Activity  . Alcohol use: Not Currently  . Drug use: Never  . Sexual activity: Yes  Other Topics Concern  . Not on file  Social History Narrative  . Not on file   Social Determinants of Health   Financial Resource Strain: Not on file  Food Insecurity: Not on file  Transportation Needs: Not on file  Physical Activity: Not on file  Stress: Not on file  Social Connections: Not on file  Intimate Partner Violence: Not on file  :  Review of Systems: A comprehensive 14 point  review of systems was negative except was noted in the HPI.  Exam: BP (!) 91/57 (BP Location: Right Arm)   Pulse 69   Temp 97.7 F (36.5 C) (Oral)   Resp 15   SpO2 97%   General:  well-nourished in no acute distress.   Eyes:  no scleral icterus.   ENT:  There were no oropharyngeal lesions.   Neck was without thyromegaly.   Lymphatics:  Negative cervical, supraclavicular or axillary adenopathy.   Respiratory: lungs were clear bilaterally without wheezing or crackles.   Cardiovascular:  Regular rate and rhythm, S1/S2, without murmur, rub or gallop.  There was no pedal edema.   GI:  abdomen was soft, flat, nontender, nondistended, without organomegaly.   Musculoskeletal:  no spinal tenderness of palpation of vertebral spine.   Skin exam was without echymosis, petichae.   Neuro exam was nonfocal. Patient was alert and oriented.  Attention was good.   Language was appropriate.  Mood was normal without depression.  Speech was not pressured.  Thought content was not tangential.    CBC    Component Value Date/Time   WBC 28.6 (H) 07/07/2020 0955   WBC 5.3 06/27/2020 0920   RBC 2.92 (L) 07/07/2020 0955   HGB 8.9 (L) 07/07/2020 0955   HCT 26.2 (L) 07/07/2020 0955   PLT 162 07/07/2020 0955   MCV 89.7 07/07/2020 0955   MCH 30.5 07/07/2020 0955   MCHC 34.0 07/07/2020 0955   RDW 16.9 (H) 07/07/2020 0955   LYMPHSABS 1.1 07/07/2020 0955   MONOABS 2.0 (H) 07/07/2020 0955   EOSABS 0.9 (H) 07/07/2020 0955   BASOSABS 0.6 (H) 07/07/2020 0955    NM PET Image Restag (PS) Skull Base To Thigh  Result Date: 05/28/2020 CLINICAL DATA:  Subsequent treatment strategy for diffuse large B-cell lymphoma. EXAM: NUCLEAR MEDICINE PET SKULL BASE TO THIGH TECHNIQUE: 7.35 mCi F-18 FDG was injected intravenously. Full-ring PET imaging was performed from the skull base to thigh after the radiotracer. CT data was obtained and used for attenuation correction and anatomic localization. Fasting blood glucose: 85 mg/dl  COMPARISON:  04/08/2020 FINDINGS: Mediastinal blood pool activity: SUV max 1.66 Liver activity: SUV max 2.74 NECK: Marked interval decrease in the metabolism associated with lymph nodes in the neck. Level 2 lymph node on the RIGHT previously 0.9 cm now approximately 3 mm and without evidence of significant FDG uptake. Representative LEFT level 4 lymph nodes seen on the prior study previously with a maximum SUV of 22 shows mild enlargement on today's study at 1 cm as compared to 1.8 cm (image 47, series 4) current maximum SUV of 1.7. RIGHT thoracic inlet lymph node measuring 2.2 cm on today's study shown to be necrotic with some peripheral activity on the prior exam previously at 3.1 cm. Mild uptake at the periphery with a maximum SUV of 4.2. Symmetric bilateral presumed physiologic parotid uptake Incidental CT findings: none CHEST: Bulky mediastinal  and hilar lymph nodes seen on the previous exam have largely resolved. Thoracic inlet lymph node beneath the sternum (image 56, series 4) 9 mm short axis, previously 11 mm short axis maximum SUV of 2.0 as compared to 21 Subcarinal nodal tissue (image 78 of series 4) measuring 2 cm short axis, previously 2.8 cm short axis with a maximum SUV of 4.3 as compared to 22. RIGHT hilar lymph node (image 82, series 4) 18 mm as compared to 22 mm SUV at 2.5 as compared to 25 Soft tissue along the aorta corresponding to LEFT infrahilar nodal tissue measuring 1 cm short axis dimension on image 89 series 4 previously 2.8 cm maximum SUV of 2.9 as compared to as much as 24 on the prior study Resolution of activity associated with a mildly enlarged LEFT axillary lymph node that was seen on prior study previously with activity up to a maximum SUV of 21. Subcentimeter lymph node LEFT axilla on image 61 of series 4 with a SUV of 1.2 on the current study Incidental CT findings: Scattered aortic atherosclerotic changes. RIGHT-sided Port-A-Cath terminates in the RIGHT atrium. Heart size is  normal without pericardial effusion. Aortic caliber is. Central pulmonary vascular caliber normal. Limited assessment of cardiovascular structures given lack of intravenous contrast. Minimal basilar atelectasis. Airways are patent. ABDOMEN/PELVIS: Focal areas of uptake in the spleen resolved. Splenic activity remains slightly above background liver activity. Splenic size is stable, unenlarged. No hypermetabolic lymph nodes in the abdomen or pelvis. Increased metabolism within lymph nodes in the upper abdomen seen on the previous study has resolved. There is decreased size of a hepatogastric lymph node, now measuring 1.4 cm as compared to 2.8 cm. (Image 113, series 4) max SUV of 2.5. Incidental CT findings: Liver, pancreas gallbladder, adrenal glands, kidneys stomach, small large bowel without acute process. Stool throughout the colon. Calcified atheromatous plaque in the abdominal aorta. SKELETON: Diffuse increased metabolism throughout marrow spaces of the spine and pelvis in particular. No focal uptake. Incidental CT findings: none IMPRESSION: 1. Marked interval response to therapy with resolution of nearly all areas of increased FDG uptake compared to the previous study. Most areas with residual uptake Deauville category 3. Subcarinal and RIGHT thoracic inlet with uptake slightly greater than liver, technically Deauville category 4 as described. Maximum SUV on today's study is 4.3 in the subcarinal region. 2. No new signs of disease. 3. Diffuse marrow activity presumably related to marrow stimulation. Suggest attention on follow-up. Aortic Atherosclerosis (ICD10-I70.0). Electronically Signed   By: Zetta Bills M.D.   On: 05/28/2020 16:42     NM PET Image Restag (PS) Skull Base To Thigh  Result Date: 05/28/2020 CLINICAL DATA:  Subsequent treatment strategy for diffuse large B-cell lymphoma. EXAM: NUCLEAR MEDICINE PET SKULL BASE TO THIGH TECHNIQUE: 7.35 mCi F-18 FDG was injected intravenously. Full-ring  PET imaging was performed from the skull base to thigh after the radiotracer. CT data was obtained and used for attenuation correction and anatomic localization. Fasting blood glucose: 85 mg/dl COMPARISON:  04/08/2020 FINDINGS: Mediastinal blood pool activity: SUV max 1.66 Liver activity: SUV max 2.74 NECK: Marked interval decrease in the metabolism associated with lymph nodes in the neck. Level 2 lymph node on the RIGHT previously 0.9 cm now approximately 3 mm and without evidence of significant FDG uptake. Representative LEFT level 4 lymph nodes seen on the prior study previously with a maximum SUV of 22 shows mild enlargement on today's study at 1 cm as compared to 1.8 cm (image 47,  series 4) current maximum SUV of 1.7. RIGHT thoracic inlet lymph node measuring 2.2 cm on today's study shown to be necrotic with some peripheral activity on the prior exam previously at 3.1 cm. Mild uptake at the periphery with a maximum SUV of 4.2. Symmetric bilateral presumed physiologic parotid uptake Incidental CT findings: none CHEST: Bulky mediastinal and hilar lymph nodes seen on the previous exam have largely resolved. Thoracic inlet lymph node beneath the sternum (image 56, series 4) 9 mm short axis, previously 11 mm short axis maximum SUV of 2.0 as compared to 21 Subcarinal nodal tissue (image 78 of series 4) measuring 2 cm short axis, previously 2.8 cm short axis with a maximum SUV of 4.3 as compared to 22. RIGHT hilar lymph node (image 82, series 4) 18 mm as compared to 22 mm SUV at 2.5 as compared to 25 Soft tissue along the aorta corresponding to LEFT infrahilar nodal tissue measuring 1 cm short axis dimension on image 89 series 4 previously 2.8 cm maximum SUV of 2.9 as compared to as much as 24 on the prior study Resolution of activity associated with a mildly enlarged LEFT axillary lymph node that was seen on prior study previously with activity up to a maximum SUV of 21. Subcentimeter lymph node LEFT axilla on image  61 of series 4 with a SUV of 1.2 on the current study Incidental CT findings: Scattered aortic atherosclerotic changes. RIGHT-sided Port-A-Cath terminates in the RIGHT atrium. Heart size is normal without pericardial effusion. Aortic caliber is. Central pulmonary vascular caliber normal. Limited assessment of cardiovascular structures given lack of intravenous contrast. Minimal basilar atelectasis. Airways are patent. ABDOMEN/PELVIS: Focal areas of uptake in the spleen resolved. Splenic activity remains slightly above background liver activity. Splenic size is stable, unenlarged. No hypermetabolic lymph nodes in the abdomen or pelvis. Increased metabolism within lymph nodes in the upper abdomen seen on the previous study has resolved. There is decreased size of a hepatogastric lymph node, now measuring 1.4 cm as compared to 2.8 cm. (Image 113, series 4) max SUV of 2.5. Incidental CT findings: Liver, pancreas gallbladder, adrenal glands, kidneys stomach, small large bowel without acute process. Stool throughout the colon. Calcified atheromatous plaque in the abdominal aorta. SKELETON: Diffuse increased metabolism throughout marrow spaces of the spine and pelvis in particular. No focal uptake. Incidental CT findings: none IMPRESSION: 1. Marked interval response to therapy with resolution of nearly all areas of increased FDG uptake compared to the previous study. Most areas with residual uptake Deauville category 3. Subcarinal and RIGHT thoracic inlet with uptake slightly greater than liver, technically Deauville category 4 as described. Maximum SUV on today's study is 4.3 in the subcarinal region. 2. No new signs of disease. 3. Diffuse marrow activity presumably related to marrow stimulation. Suggest attention on follow-up. Aortic Atherosclerosis (ICD10-I70.0). Electronically Signed   By: Zetta Bills M.D.   On: 05/28/2020 16:42   Assessment and Plan:   1) Newly diagnosed advanced stage IIIA Large B cell  lymphoma - activated B cell type 04/08/2020 PET/CT ([id number]) revealed "1. Intensely hypermetabolic bilateral neck, bilateral axillary, bilateral mediastinal, bilateral hilar and retroperitoneal lymphadenopathy. Numerous small hypermetabolic splenic masses. Findings are compatible with malignancy, favoring lymphoproliferative disorder."  -Admit to inpatient oncology unit for cycle 5 of EPOCH-R.   -We will obtain a baseline CBC with differential, CMET, LDH.  Labs reviewed and are adequate to proceed with treatment today. -We will consider giving intrathecal methotrexate on day 2 of cycle 5.  However, the  patient wishes to discuss further with his medical oncologist.  He prefers not to have this procedure.  We will hold on DVT prophylaxis at this time.  SCDs have been ordered. -As needed antiemetics and bowel regimen have been ordered. -Continue home pain medication. -The patient will have Rituxan at the cancer on 07/21/2020.  He will administer G-CSF as an outpatient the day after his chemotherapy completes.  Labs and follow-up visit are scheduled for 07/25/2020.  Mikey Bussing, DNP, AGPCNP-BC, AOCNP   ADDENDUM  .Patient was Personally and independently interviewed, examined and relevant elements of the history of present illness were reviewed in details and an assessment and plan was created. All elements of the patient's history of present illness , assessment and plan were discussed in details with Mikey Bussing, DNP, AGPCNP-BC, AOCNP. The above documentation reflects our combined findings assessment and plan.  Patient notes his headaches have resolved today. No nausea or vomiting. No focal neurological deficits. We discussed cutting back on his Tylenol 3 use since excessive use of his NSAIDs and codeine could be causing analgesic headaches and also confusion/dissociative state. We also asked him to minimize use of Compazine only if needed. He is on Marinol to help boost his appetite. No  back pains from his previous intrathecal methotrexate at this time. Feels like it is taking longer to bounce back from chemotherapy due to treatment-related fatigue. Hemoglobin is better today at 9.6 up from 8.9 in clinic. No fevers no chills no night sweats. Tolerating p.o. food and fluid intake. Wife [name] at bedside. He was wondering if he should go for a [employer] reunion with his colleagues and we discussed the pros and cons of this and he prefers to be extra safe plans to likely not go since he knows several of his colleagues might not be vaccinated for Covid. Discussed pros and cons and given his intolerance will hold off on additional intrathecal methotrexate for CNS prophylaxis at this time. Lovenox and SCDs for DVT prophylaxis Will continue to follow Chemotherapy reviewed and signed. Continuing same doses of EPOCH-R.  Sullivan Lone MD MS

## 2020-07-15 DIAGNOSIS — C8338 Diffuse large B-cell lymphoma, lymph nodes of multiple sites: Secondary | ICD-10-CM | POA: Diagnosis not present

## 2020-07-15 DIAGNOSIS — Z5111 Encounter for antineoplastic chemotherapy: Secondary | ICD-10-CM | POA: Diagnosis not present

## 2020-07-15 LAB — COMPREHENSIVE METABOLIC PANEL
ALT: 34 U/L (ref 0–44)
AST: 29 U/L (ref 15–41)
Albumin: 3.2 g/dL — ABNORMAL LOW (ref 3.5–5.0)
Alkaline Phosphatase: 84 U/L (ref 38–126)
Anion gap: 11 (ref 5–15)
BUN: 16 mg/dL (ref 8–23)
CO2: 21 mmol/L — ABNORMAL LOW (ref 22–32)
Calcium: 8.9 mg/dL (ref 8.9–10.3)
Chloride: 105 mmol/L (ref 98–111)
Creatinine, Ser: 0.59 mg/dL — ABNORMAL LOW (ref 0.61–1.24)
GFR, Estimated: >60 mL/min (ref >60)
Glucose, Bld: 117 mg/dL — ABNORMAL HIGH (ref 70–99)
Potassium: 4.1 mmol/L (ref 3.5–5.1)
Sodium: 137 mmol/L (ref 135–145)
Total Bilirubin: 0.4 mg/dL (ref 0.3–1.2)
Total Protein: 5.9 g/dL — ABNORMAL LOW (ref 6.5–8.1)

## 2020-07-15 LAB — CBC WITH DIFFERENTIAL/PLATELET
Abs Immature Granulocytes: 3.1 10*3/uL — ABNORMAL HIGH (ref 0.00–0.07)
Basophils Absolute: 0.2 10*3/uL — ABNORMAL HIGH (ref 0.0–0.1)
Basophils Relative: 1 %
Eosinophils Absolute: 0 10*3/uL (ref 0.0–0.5)
Eosinophils Relative: 0 %
HCT: 28 % — ABNORMAL LOW (ref 39.0–52.0)
Hemoglobin: 9.3 g/dL — ABNORMAL LOW (ref 13.0–17.0)
Immature Granulocytes: 11 %
Lymphocytes Relative: 4 %
Lymphs Abs: 1 10*3/uL (ref 0.7–4.0)
MCH: 30.5 pg (ref 26.0–34.0)
MCHC: 33.2 g/dL (ref 30.0–36.0)
MCV: 91.8 fL (ref 80.0–100.0)
Monocytes Absolute: 1.1 10*3/uL — ABNORMAL HIGH (ref 0.1–1.0)
Monocytes Relative: 4 %
Neutro Abs: 23.9 10*3/uL — ABNORMAL HIGH (ref 1.7–7.7)
Neutrophils Relative %: 80 %
Platelets: 491 10*3/uL — ABNORMAL HIGH (ref 150–400)
RBC: 3.05 MIL/uL — ABNORMAL LOW (ref 4.22–5.81)
RDW: 17 % — ABNORMAL HIGH (ref 11.5–15.5)
WBC: 29.2 10*3/uL — ABNORMAL HIGH (ref 4.0–10.5)
nRBC: 0 % (ref 0.0–0.2)

## 2020-07-15 MED ORDER — VINCRISTINE SULFATE CHEMO INJECTION 1 MG/ML
Freq: Once | INTRAVENOUS | Status: AC
  Filled 2020-07-15: qty 10

## 2020-07-15 MED ORDER — ENOXAPARIN SODIUM 40 MG/0.4ML ~~LOC~~ SOLN: 40.0000 mg | SUBCUTANEOUS | Status: DC

## 2020-07-15 MED ORDER — SODIUM CHLORIDE 0.9 % IV SOLN
Freq: Once | INTRAVENOUS | Status: AC
  Administered 2020-07-15: 18 mg via INTRAVENOUS
  Filled 2020-07-15: qty 4

## 2020-07-15 NOTE — Progress Notes (Addendum)
HEMATOLOGY-ONCOLOGY PROGRESS NOTE  SUBJECTIVE: [name] tolerated day 1 of cycle 5 of his chemotherapy well overall.  He denies having any headaches this morning.  He is trying to take less Tylenol 3.  This morning he denies mucositis, chest pain, shortness of breath, abdominal pain, nausea, vomiting.  Bowels are moving without any difficulty.  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 ([place])     REVIEW OF SYSTEMS:   Constitutional: Denies fevers, chills or abnormal weight loss Eyes: Denies blurriness of vision Ears, nose, mouth, throat, and face: Denies mucositis or sore throat Respiratory: Denies cough, dyspnea or wheezes Cardiovascular: Denies palpitation, chest discomfort Gastrointestinal:  Denies nausea, heartburn or change in bowel habits Skin: Denies abnormal skin rashes Lymphatics: Denies new lymphadenopathy or easy bruising Neurological:Denies numbness, tingling or new weaknesses Behavioral/Psych: Mood is stable, no new changes  Extremities: No lower extremity edema All other systems were reviewed with the patient and are negative.  I have reviewed the past medical history, past surgical history, social history and family history with the patient and they are unchanged from previous note.   PHYSICAL EXAMINATION: ECOG PERFORMANCE STATUS: 1 - Symptomatic but completely ambulatory  Vitals:   07/14/20 2220 07/15/20 0452  BP: (!) 98/59 114/65  Pulse: 63 65  Resp: 16 16  Temp: 97.6 F (36.4 C)  97.7 F (36.5 C)  SpO2: 94% 96%   Filed Weights   07/14/20 1238  Weight: 65.7 kg    Intake/Output from previous day: 02/07 0701 - 02/08 0700 In: 522.8 [IV Piggyback:522.8] Out: -   GENERAL:alert, no distress and comfortable SKIN: skin color, texture, turgor are normal, no rashes or significant lesions EYES: normal, Conjunctiva are pink and non-injected, sclera clear OROPHARYNX:no exudate, no erythema and lips, buccal mucosa, and tongue normal  LUNGS: clear to auscultation and percussion with normal breathing effort HEART: regular rate & rhythm and no murmurs and no lower extremity edema ABDOMEN:abdomen soft, non-tender and normal bowel sounds Musculoskeletal:no cyanosis of digits and no clubbing  NEURO: alert & oriented x 3 with fluent speech, no focal motor/sensory deficits  LABORATORY DATA:  I have reviewed the data as listed CMP Latest Ref Rng & Units 07/15/2020 07/14/2020 07/07/2020  Glucose 70 - 99 mg/dL 117(H) 95 107(H)  BUN 8 - 23 mg/dL 16 10 8   Creatinine 0.61 - 1.24 mg/dL 0.59(L) 0.67 0.73  Sodium 135 - 145 mmol/L 137 138 136  Potassium 3.5 - 5.1 mmol/L 4.1 4.3 4.3  Chloride 98 - 111 mmol/L 105 102 104  CO2 22 - 32 mmol/L 21(L) 25 26  Calcium 8.9 - 10.3 mg/dL 8.9 9.0 8.9  Total Protein 6.5 - 8.1 g/dL 5.9(L) 6.2(L) 5.9(L)  Total Bilirubin 0.3 - 1.2 mg/dL 0.4 0.5 0.2(L)  Alkaline Phos 38 - 126 U/L 84 87 91  AST 15 - 41 U/L 29 22 17   ALT 0 - 44 U/L 34 24 21    Lab Results  Component Value Date   WBC 29.2 (H)  07/15/2020   HGB 9.3 (L) 07/15/2020   HCT 28.0 (L) 07/15/2020   MCV 91.8 07/15/2020   PLT 491 (H) 07/15/2020   NEUTROABS 23.9 (H) 07/15/2020    DG FLUORO GUIDE LUMBAR PUNCTURE  Result Date: 06/24/2020 CLINICAL DATA:  65 year old male with history of diffuse large B-cell lymphoma. EXAM: FLUOROSCOPICALLY GUIDED LUMBAR PUNCTURE FOR INTRATHECAL CHEMOTHERAPY TECHNIQUE: Informed consent was obtained from the patient prior to the procedure, including potential  complications of headache, allergy, and pain. A 'time out' was performed. With the patient prone, the lower back was prepped with Betadine. 1% Lidocaine was used for local anesthesia. Lumbar puncture was performed at the L2-L3 using a 20 gauge needle with return of blood-tinged CSF. Opening pressure was 11.5 cm of water. A total of 7 mL of CSF was collected and sent to the laboratory for testing per orders of the primary medical team. 12 mg of methotrexate was injected into the subarachnoid space. The patient tolerated the procedure well without apparent complication. FLUOROSCOPY TIME:  0.7 minutes IMPRESSION: Intrathecal injection of chemotherapy without complication Electronically Signed   By: Vinnie Langton M.D.   On: 06/24/2020 12:37    ASSESSMENT AND PLAN: 1) Newly diagnosed advanced stage IIIA Large B cell lymphoma - activated B cell type 04/08/2020 PET/CT ([id number]) revealed "1. Intensely hypermetabolic bilateral neck, bilateral axillary, bilateral mediastinal, bilateral hilar and retroperitoneal lymphadenopathy. Numerous small hypermetabolic splenic masses. Findings are compatible with malignancy, favoring lymphoproliferative disorder."  -Labs reviewed and are adequate to continue with treatment.  He will proceed with day 2 of cycle for 5 his chemotherapy today as planned.  -Continue to check daily CBC with differential and CMET. -We will hold on intrathecal chemo this cycle per patient preference. -Lovenox and SCDs for DVT prophylaxis. -As needed antiemetics and bowel regimen have been ordered. -Advised patient to take regular Tylenol instead of Tylenol #3 for headaches.  He will try to use Tylenol 3 sparingly. -The patient will have Rituxan at the cancer on 07/21/2020.  He will administer G-CSF as an outpatient the day after his chemotherapy completes.  Labs and follow-up visit are scheduled for 07/25/2020.   LOS: 1 day   Mikey Bussing, DNP, AGPCNP-BC,  AOCNP 07/15/20   ADDENDUM  .Patient was Personally and independently interviewed, examined and relevant elements of the history of present illness were reviewed in details and an assessment and plan was created. All elements of the patient's history of present illness , assessment and plan were discussed in details with Mikey Bussing, DNP, AGPCNP-BC, AOCNP. The above documentation reflects our combined findings assessment and plan.  Patient notes headaches are much improved and nearly resolved.  No uncontrolled nausea or vomiting.  Good p.o. intake.  Moving his bowels okay. Discussed minimizing use of Compazine and tylenol# 3 as much as possible.  Sullivan Lone MD MS

## 2020-07-16 DIAGNOSIS — Z5111 Encounter for antineoplastic chemotherapy: Secondary | ICD-10-CM | POA: Diagnosis not present

## 2020-07-16 DIAGNOSIS — C8338 Diffuse large B-cell lymphoma, lymph nodes of multiple sites: Secondary | ICD-10-CM | POA: Diagnosis not present

## 2020-07-16 LAB — COMPREHENSIVE METABOLIC PANEL
ALT: 60 U/L — ABNORMAL HIGH (ref 0–44)
AST: 50 U/L — ABNORMAL HIGH (ref 15–41)
Albumin: 3.2 g/dL — ABNORMAL LOW (ref 3.5–5.0)
Alkaline Phosphatase: 80 U/L (ref 38–126)
Anion gap: 9 (ref 5–15)
BUN: 18 mg/dL (ref 8–23)
CO2: 22 mmol/L (ref 22–32)
Calcium: 8.7 mg/dL — ABNORMAL LOW (ref 8.9–10.3)
Chloride: 109 mmol/L (ref 98–111)
Creatinine, Ser: 0.6 mg/dL — ABNORMAL LOW (ref 0.61–1.24)
GFR, Estimated: >60 mL/min (ref >60)
Glucose, Bld: 109 mg/dL — ABNORMAL HIGH (ref 70–99)
Potassium: 3.6 mmol/L (ref 3.5–5.1)
Sodium: 140 mmol/L (ref 135–145)
Total Bilirubin: 0.4 mg/dL (ref 0.3–1.2)
Total Protein: 5.6 g/dL — ABNORMAL LOW (ref 6.5–8.1)

## 2020-07-16 LAB — CBC WITH DIFFERENTIAL/PLATELET
Abs Immature Granulocytes: 1.71 10*3/uL — ABNORMAL HIGH (ref 0.00–0.07)
Basophils Absolute: 0.1 10*3/uL (ref 0.0–0.1)
Basophils Relative: 0 %
Eosinophils Absolute: 0 10*3/uL (ref 0.0–0.5)
Eosinophils Relative: 0 %
HCT: 27.4 % — ABNORMAL LOW (ref 39.0–52.0)
Hemoglobin: 9 g/dL — ABNORMAL LOW (ref 13.0–17.0)
Immature Granulocytes: 6 %
Lymphocytes Relative: 3 %
Lymphs Abs: 1 10*3/uL (ref 0.7–4.0)
MCH: 30.8 pg (ref 26.0–34.0)
MCHC: 32.8 g/dL (ref 30.0–36.0)
MCV: 93.8 fL (ref 80.0–100.0)
Monocytes Absolute: 1.3 10*3/uL — ABNORMAL HIGH (ref 0.1–1.0)
Monocytes Relative: 4 %
Neutro Abs: 25.4 10*3/uL — ABNORMAL HIGH (ref 1.7–7.7)
Neutrophils Relative %: 87 %
Platelets: 502 10*3/uL — ABNORMAL HIGH (ref 150–400)
RBC: 2.92 MIL/uL — ABNORMAL LOW (ref 4.22–5.81)
RDW: 16.8 % — ABNORMAL HIGH (ref 11.5–15.5)
WBC: 29.5 10*3/uL — ABNORMAL HIGH (ref 4.0–10.5)
nRBC: 0 % (ref 0.0–0.2)

## 2020-07-16 MED ORDER — SODIUM CHLORIDE 0.9 % IV SOLN
Freq: Once | INTRAVENOUS | Status: AC
  Administered 2020-07-16: 18 mg via INTRAVENOUS
  Filled 2020-07-16: qty 4

## 2020-07-16 MED ORDER — VINCRISTINE SULFATE CHEMO INJECTION 1 MG/ML
Freq: Once | INTRAVENOUS | Status: AC
  Filled 2020-07-16: qty 10

## 2020-07-16 NOTE — Progress Notes (Signed)
HEMATOLOGY-ONCOLOGY PROGRESS NOTE  SUBJECTIVE:  Mr. Muench tolerated day 2 of cycle 5 of his chemotherapy well overall.  No acute issues tolerating day 3 of EPOCH-R chemotherapy today.  No acute nausea.  Good bowel movement.  Headaches have resolved. Notes that he has slept well the last couple of days and feels more relaxed and refreshed. Overall in a positive frame of mind.    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  I have reviewed the past medical history, past surgical history, social history and family history with the patient and they are unchanged from previous note.   PHYSICAL EXAMINATION: ECOG PERFORMANCE STATUS: 1 - Symptomatic but completely ambulatory  Vitals:   07/15/20 2031 07/16/20 0515  BP: 106/63 112/68  Pulse: 67 60  Resp: 16 16  Temp: 97.7 F (36.5 C) (!) 97.3 F (36.3 C)  SpO2: 96% 97%   Filed Weights   07/14/20 1238  Weight: 144 lb 12.8 oz (65.7 kg)   . GENERAL:alert, in no acute distress and comfortable SKIN: no acute rashes, no significant lesions EYES: conjunctiva are pink and non-injected, sclera anicteric OROPHARYNX: MMM, no exudates, no oropharyngeal erythema or ulceration NECK: supple, no JVD LYMPH:  no palpable lymphadenopathy in the cervical, axillary or inguinal regions LUNGS: clear to auscultation b/l with normal respiratory effort HEART: regular rate &  rhythm ABDOMEN:  normoactive bowel sounds , non tender, not distended. Extremity: no pedal edema PSYCH: alert & oriented x 3 with fluent speech NEURO: no focal motor/sensory deficits  LABORATORY DATA:  I have reviewed the data as listed CMP Latest Ref Rng & Units 07/16/2020 07/15/2020 07/14/2020  Glucose 70 - 99 mg/dL 109(H) 117(H) 95  BUN 8 - 23 mg/dL 18 16 10   Creatinine 0.61 - 1.24 mg/dL 0.60(L) 0.59(L) 0.67  Sodium 135 - 145 mmol/L 140 137 138  Potassium 3.5 - 5.1 mmol/L 3.6 4.1 4.3  Chloride 98 - 111 mmol/L 109 105 102  CO2 22 - 32 mmol/L 22 21(L) 25  Calcium 8.9 - 10.3 mg/dL 8.7(L) 8.9 9.0  Total Protein 6.5 - 8.1 g/dL 5.6(L) 5.9(L) 6.2(L)  Total Bilirubin 0.3 - 1.2 mg/dL 0.4 0.4 0.5  Alkaline Phos 38 - 126 U/L 80 84 87  AST 15 - 41 U/L 50(H) 29 22  ALT 0 - 44 U/L 60(H) 34 24   . CMP Latest Ref Rng & Units 07/16/2020 07/15/2020 07/14/2020  Glucose 70 - 99 mg/dL 109(H) 117(H) 95  BUN 8 - 23 mg/dL 18 16 10   Creatinine 0.61 - 1.24 mg/dL 0.60(L) 0.59(L) 0.67  Sodium 135 - 145 mmol/L 140 137 138  Potassium 3.5 - 5.1 mmol/L 3.6 4.1 4.3  Chloride 98 - 111 mmol/L 109 105 102  CO2 22 - 32 mmol/L 22 21(L) 25  Calcium 8.9 - 10.3 mg/dL 8.7(L) 8.9 9.0  Total Protein 6.5 - 8.1 g/dL  5.6(L) 5.9(L) 6.2(L)  Total Bilirubin 0.3 - 1.2 mg/dL 0.4 0.4 0.5  Alkaline Phos 38 - 126 U/L 80 84 87  AST 15 - 41 U/L 50(H) 29 22  ALT 0 - 44 U/L 60(H) 34 24     DG FLUORO GUIDE LUMBAR PUNCTURE  Result Date: 06/24/2020 CLINICAL DATA:  65 year old male with history of diffuse large B-cell lymphoma. EXAM: FLUOROSCOPICALLY GUIDED LUMBAR PUNCTURE FOR INTRATHECAL CHEMOTHERAPY TECHNIQUE: Informed consent was obtained from the patient prior to the procedure, including potential complications of headache, allergy, and pain. A 'time out' was performed. With the patient prone, the lower back was prepped with Betadine. 1% Lidocaine was used for local anesthesia. Lumbar puncture was performed at the L2-L3 using a 20 gauge  needle with return of blood-tinged CSF. Opening pressure was 11.5 cm of water. A total of 7 mL of CSF was collected and sent to the laboratory for testing per orders of the primary medical team. 12 mg of methotrexate was injected into the subarachnoid space. The patient tolerated the procedure well without apparent complication. FLUOROSCOPY TIME:  0.7 minutes IMPRESSION: Intrathecal injection of chemotherapy without complication Electronically Signed   By: Vinnie Langton M.D.   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 Intensely hypermetabolic bilateral neck, bilateral axillary, bilateral mediastinal, bilateral hilar and retroperitoneal lymphadenopathy. Numerous small hypermetabolic splenic masses. Findings are compatible with malignancy, favoring lymphoproliferative disorder." PLAN -Labs reviewed and are adequate to continue with treatment.  He will proceed with day 3 of cycle for 5 his chemotherapy today as planned.  -Continue to check daily CBC with differential and CMET. -We will hold on intrathecal chemo this cycle per patient preference. -Lovenox and SCDs for DVT prophylaxis. -As needed antiemetics and bowel regimen have been ordered. -The patient will have Rituxan at the cancer on 07/21/2020.  He will administer G-CSF as an outpatient the day after his chemotherapy completes.  Labs and follow-up visit are scheduled for 07/25/2020.  2) headaches likely tension headaches and related to sleep deprivation.  Currently resolved No focal neurological deficits. -Minimize Compazine and Tylenol # 3  3) abnormal liver function tests borderline increase less than 2 times upper limit of normal Likely related to chemotherapy of steroids. -Continue to monitor   Sullivan Lone MD [place]

## 2020-07-17 LAB — CBC WITH DIFFERENTIAL/PLATELET
Abs Immature Granulocytes: 0.37 10*3/uL — ABNORMAL HIGH (ref 0.00–0.07)
Basophils Absolute: 0 10*3/uL (ref 0.0–0.1)
Basophils Relative: 0 %
Eosinophils Absolute: 0 10*3/uL (ref 0.0–0.5)
Eosinophils Relative: 0 %
HCT: 26.1 % — ABNORMAL LOW (ref 39.0–52.0)
Hemoglobin: 8.9 g/dL — ABNORMAL LOW (ref 13.0–17.0)
Immature Granulocytes: 2 %
Lymphocytes Relative: 6 %
Lymphs Abs: 0.9 10*3/uL (ref 0.7–4.0)
MCH: 30.9 pg (ref 26.0–34.0)
MCHC: 34.1 g/dL (ref 30.0–36.0)
MCV: 90.6 fL (ref 80.0–100.0)
Monocytes Absolute: 0.7 10*3/uL (ref 0.1–1.0)
Monocytes Relative: 5 %
Neutro Abs: 13.2 10*3/uL — ABNORMAL HIGH (ref 1.7–7.7)
Neutrophils Relative %: 87 %
Platelets: 551 10*3/uL — ABNORMAL HIGH (ref 150–400)
RBC: 2.88 MIL/uL — ABNORMAL LOW (ref 4.22–5.81)
RDW: 16.9 % — ABNORMAL HIGH (ref 11.5–15.5)
WBC: 15.2 10*3/uL — ABNORMAL HIGH (ref 4.0–10.5)
nRBC: 0 % (ref 0.0–0.2)

## 2020-07-17 LAB — COMPREHENSIVE METABOLIC PANEL
ALT: 50 U/L — ABNORMAL HIGH (ref 0–44)
AST: 24 U/L (ref 15–41)
Albumin: 3 g/dL — ABNORMAL LOW (ref 3.5–5.0)
Alkaline Phosphatase: 69 U/L (ref 38–126)
Anion gap: 10 (ref 5–15)
BUN: 17 mg/dL (ref 8–23)
CO2: 24 mmol/L (ref 22–32)
Calcium: 8.5 mg/dL — ABNORMAL LOW (ref 8.9–10.3)
Chloride: 105 mmol/L (ref 98–111)
Creatinine, Ser: 0.68 mg/dL (ref 0.61–1.24)
GFR, Estimated: >60 mL/min (ref >60)
Glucose, Bld: 89 mg/dL (ref 70–99)
Potassium: 3.4 mmol/L — ABNORMAL LOW (ref 3.5–5.1)
Sodium: 139 mmol/L (ref 135–145)
Total Bilirubin: 0.7 mg/dL (ref 0.3–1.2)
Total Protein: 5.2 g/dL — ABNORMAL LOW (ref 6.5–8.1)

## 2020-07-17 MED ORDER — SODIUM CHLORIDE 0.9 % IV SOLN
Freq: Once | INTRAVENOUS | Status: AC
  Administered 2020-07-17: 18 mg via INTRAVENOUS
  Filled 2020-07-17: qty 4

## 2020-07-17 MED ORDER — SODIUM CHLORIDE 0.9 % IV SOLN
Freq: Once | INTRAVENOUS | Status: AC
  Administered 2020-07-18: 36 mg via INTRAVENOUS
  Filled 2020-07-17: qty 8

## 2020-07-17 MED ORDER — SODIUM CHLORIDE 0.9 % IV SOLN
750.0000 mg/m2 | Freq: Once | INTRAVENOUS | Status: AC
  Administered 2020-07-18: 1300 mg via INTRAVENOUS
  Filled 2020-07-17: qty 65

## 2020-07-17 MED ORDER — VINCRISTINE SULFATE CHEMO INJECTION 1 MG/ML
Freq: Once | INTRAVENOUS | Status: AC
  Filled 2020-07-17: qty 10

## 2020-07-17 MED ORDER — POTASSIUM CHLORIDE CRYS ER 20 MEQ PO TBCR
20.0000 meq | EXTENDED_RELEASE_TABLET | Freq: Every day | ORAL | Status: DC
  Administered 2020-07-17 – 2020-07-18 (×2): 20 meq via ORAL
  Filled 2020-07-17 (×2): qty 1

## 2020-07-17 NOTE — Progress Notes (Addendum)
HEMATOLOGY-ONCOLOGY PROGRESS NOTE  SUBJECTIVE: [name] tolerated day 3 of cycle 5 of his chemotherapy well overall.  He denies having any headaches this morning. Reports mild nausea this morning and took lorazepam. This morning he denies mucositis, chest pain, shortness of breath, abdominal pain, nausea.  Bowels are moving without any difficulty.  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[place])     REVIEW OF SYSTEMS:   Constitutional: Denies fevers, chills or abnormal weight loss Eyes: Denies blurriness of vision Ears, nose, mouth, throat, and face: Denies mucositis or sore throat Respiratory: Denies cough, dyspnea or wheezes Cardiovascular: Denies palpitation, chest discomfort Gastrointestinal:  Denies nausea, heartburn or change in bowel habits Skin: Denies abnormal skin rashes Lymphatics: Denies new lymphadenopathy or easy bruising Neurological:Denies numbness, tingling or new weaknesses Behavioral/Psych: Mood is stable, no new changes  Extremities: No lower extremity edema All other systems were reviewed with the patient and are negative.  I have reviewed the past medical history, past surgical history, social history and family history with the patient and they are unchanged from previous note.   PHYSICAL EXAMINATION: ECOG PERFORMANCE STATUS: 1 - Symptomatic but completely ambulatory  Vitals:   07/16/20 2005 07/17/20 0527  BP: 117/70 129/85  Pulse: 70 (!) 56  Resp: 14 14  Temp: 98.2 F (36.8  C) 97.7 F (36.5 C)  SpO2: 97% 100%   Filed Weights   07/14/20 1238  Weight: 65.7 kg    Intake/Output from previous day: 02/09 0701 - 02/10 0700 In: 939.3 [P.O.:720; IV Piggyback:219.3] Out: -   GENERAL:alert, no distress and comfortable SKIN: skin color, texture, turgor are normal, no rashes or significant lesions EYES: normal, Conjunctiva are pink and non-injected, sclera clear OROPHARYNX:no exudate, no erythema and lips, buccal mucosa, and tongue normal  LUNGS: clear to auscultation and percussion with normal breathing effort HEART: regular rate & rhythm and no murmurs and no lower extremity edema ABDOMEN:abdomen soft, non-tender and normal bowel sounds Musculoskeletal:no cyanosis of digits and no clubbing  NEURO: alert & oriented x 3 with fluent speech, no focal motor/sensory deficits  LABORATORY DATA:  I have reviewed the data as listed CMP Latest Ref Rng & Units 07/16/2020 07/15/2020 07/14/2020  Glucose 70 - 99 mg/dL 109(H) 117(H) 95  BUN 8 - 23 mg/dL 18 16 10   Creatinine 0.61 - 1.24 mg/dL 0.60(L) 0.59(L) 0.67  Sodium 135 - 145 mmol/L 140 137 138  Potassium 3.5 - 5.1 mmol/L 3.6 4.1 4.3  Chloride 98 - 111 mmol/L 109 105 102  CO2 22 - 32 mmol/L 22 21(L) 25  Calcium 8.9 - 10.3 mg/dL 8.7(L) 8.9 9.0  Total Protein 6.5 - 8.1 g/dL 5.6(L) 5.9(L) 6.2(L)  Total Bilirubin 0.3 - 1.2 mg/dL 0.4 0.4 0.5  Alkaline Phos 38 - 126 U/L 80 84 87  AST 15 - 41 U/L 50(H) 29 22  ALT 0 - 44 U/L 60(H) 34 24    Lab Results  Component Value Date   WBC 29.5 (H) 07/16/2020  HGB 9.0 (L) 07/16/2020   HCT 27.4 (L) 07/16/2020   MCV 93.8 07/16/2020   PLT 502 (H) 07/16/2020   NEUTROABS 25.4 (H) 07/16/2020    DG FLUORO GUIDE LUMBAR PUNCTURE  Result Date: 06/24/2020 CLINICAL DATA:  65 year old male with history of diffuse large B-cell lymphoma. EXAM: FLUOROSCOPICALLY GUIDED LUMBAR PUNCTURE FOR INTRATHECAL CHEMOTHERAPY TECHNIQUE: Informed consent was obtained from the patient prior to the procedure,  including potential complications of headache, allergy, and pain. A 'time out' was performed. With the patient prone, the lower back was prepped with Betadine. 1% Lidocaine was used for local anesthesia. Lumbar puncture was performed at the L2-L3 using a 20 gauge needle with return of blood-tinged CSF. Opening pressure was 11.5 cm of water. A total of 7 mL of CSF was collected and sent to the laboratory for testing per orders of the primary medical team. 12 mg of methotrexate was injected into the subarachnoid space. The patient tolerated the procedure well without apparent complication. FLUOROSCOPY TIME:  0.7 minutes IMPRESSION: Intrathecal injection of chemotherapy without complication Electronically Signed   By: Vinnie Langton M.D.   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 Intensely hypermetabolic bilateral neck, bilateral axillary, bilateral mediastinal, bilateral hilar and retroperitoneal lymphadenopathy. Numerous small hypermetabolic splenic masses. Findings are compatible with malignancy, favoring lymphoproliferative disorder."  -Labs reviewed and are adequate to continue with treatment.  He will proceed with day 3 of cycle for 5 his chemotherapy today as planned.  -Continue to check daily CBC with differential and CMET. -We will hold on intrathecal chemo this cycle per patient preference. -Lovenox and SCDs for DVT prophylaxis. -As needed antiemetics and bowel regimen have been ordered. -Advised patient to take regular Tylenol instead of Tylenol #3 for headaches.  He will try to use Tylenol 3 sparingly. -Potassium level slightly low this morning at 3.4 and will add K-Dur 20 mEq daily. -The patient will have Rituxan at the cancer on 07/21/2020.  He will administer G-CSF as an outpatient the day after his chemotherapy completes.  Labs and follow-up visit are  scheduled for 07/25/2020.   LOS: 3 days   Mikey Bussing, DNP, AGPCNP-BC, AOCNP 07/17/20   ADDENDUM  Patient was Personally and independently interviewed, examined and relevant elements of the history of present illness were reviewed in details and an assessment and plan was created. All elements of the patient's history of present illness , assessment and plan were discussed in details with Mikey Bussing, DNP, AGPCNP-BC, AOCNP. The above documentation reflects our combined findings assessment and plan.  Sullivan Lone MD MS

## 2020-07-18 LAB — CBC WITH DIFFERENTIAL/PLATELET
Abs Immature Granulocytes: 0.11 10*3/uL — ABNORMAL HIGH (ref 0.00–0.07)
Basophils Absolute: 0 10*3/uL (ref 0.0–0.1)
Basophils Relative: 0 %
Eosinophils Absolute: 0 10*3/uL (ref 0.0–0.5)
Eosinophils Relative: 0 %
HCT: 28.2 % — ABNORMAL LOW (ref 39.0–52.0)
Hemoglobin: 9.5 g/dL — ABNORMAL LOW (ref 13.0–17.0)
Immature Granulocytes: 1 %
Lymphocytes Relative: 9 %
Lymphs Abs: 0.7 10*3/uL (ref 0.7–4.0)
MCH: 30.4 pg (ref 26.0–34.0)
MCHC: 33.7 g/dL (ref 30.0–36.0)
MCV: 90.1 fL (ref 80.0–100.0)
Monocytes Absolute: 0.3 10*3/uL (ref 0.1–1.0)
Monocytes Relative: 4 %
Neutro Abs: 6.9 10*3/uL (ref 1.7–7.7)
Neutrophils Relative %: 86 %
Platelets: 460 10*3/uL — ABNORMAL HIGH (ref 150–400)
RBC: 3.13 MIL/uL — ABNORMAL LOW (ref 4.22–5.81)
RDW: 16.4 % — ABNORMAL HIGH (ref 11.5–15.5)
WBC: 8.1 10*3/uL (ref 4.0–10.5)
nRBC: 0 % (ref 0.0–0.2)

## 2020-07-18 LAB — COMPREHENSIVE METABOLIC PANEL
ALT: 43 U/L (ref 0–44)
AST: 17 U/L (ref 15–41)
Albumin: 3.2 g/dL — ABNORMAL LOW (ref 3.5–5.0)
Alkaline Phosphatase: 65 U/L (ref 38–126)
Anion gap: 9 (ref 5–15)
BUN: 16 mg/dL (ref 8–23)
CO2: 24 mmol/L (ref 22–32)
Calcium: 8.8 mg/dL — ABNORMAL LOW (ref 8.9–10.3)
Chloride: 106 mmol/L (ref 98–111)
Creatinine, Ser: 0.65 mg/dL (ref 0.61–1.24)
GFR, Estimated: >60 mL/min (ref >60)
Glucose, Bld: 94 mg/dL (ref 70–99)
Potassium: 3.5 mmol/L (ref 3.5–5.1)
Sodium: 139 mmol/L (ref 135–145)
Total Bilirubin: 0.9 mg/dL (ref 0.3–1.2)
Total Protein: 5.8 g/dL — ABNORMAL LOW (ref 6.5–8.1)

## 2020-07-18 MED ORDER — HEPARIN SOD (PORK) LOCK FLUSH 100 UNIT/ML IV SOLN
500.0000 [IU] | Freq: Once | INTRAVENOUS | Status: AC
  Administered 2020-07-18: 500 [IU] via INTRAVENOUS
  Filled 2020-07-18: qty 5

## 2020-07-18 MED ORDER — ACETAMINOPHEN-CODEINE 300-30 MG PO TABS: ORAL_TABLET | ORAL | 0 refills | Status: DC

## 2020-07-18 MED ORDER — LORAZEPAM 0.5 MG PO TABS: 0.5000 mg | ORAL_TABLET | Freq: Three times a day (TID) | ORAL | 0 refills | Status: DC | PRN

## 2020-07-18 NOTE — Discharge Summary (Addendum)
Discharge Summary  Patient ID: Bryan [name] MRN: [id number] DOB/AGE: 64-Dec-1957 64 y.o.  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[place])   Nonintractable headache  Discharged Condition: good  Discharge Labs:    CBC    Component Value Date/Time   WBC 8.1 07/18/2020 0405   RBC 3.13 (L) 07/18/2020 0405   HGB 9.5 (L) 07/18/2020 0405   HGB 8.9 (L) 07/07/2020 0955   HCT 28.2 (L) 07/18/2020 0405   PLT 460 (H) 07/18/2020 0405   PLT 162 07/07/2020 0955   MCV 90.1 07/18/2020 0405   MCH 30.4 07/18/2020 0405   MCHC 33.7 07/18/2020 0405   RDW 16.4 (H) 07/18/2020 0405   LYMPHSABS 0.7 07/18/2020 0405   MONOABS 0.3 07/18/2020 0405   EOSABS 0.0 07/18/2020 0405   BASOSABS 0.0 07/18/2020 0405   CMP Latest Ref Rng & Units 07/18/2020 07/17/2020 07/16/2020  Glucose 70 - 99 mg/dL 94 89 109(H)  BUN 8 - 23 mg/dL 16 17 18   Creatinine 0.61 - 1.24 mg/dL 0.65 0.68 0.60(L)  Sodium 135 - 145 mmol/L 139 139 140  Potassium 3.5 - 5.1 mmol/L 3.5 3.4(L) 3.6  Chloride 98 - 111 mmol/L 106 105 109  CO2 22 - 32 mmol/L 24 24 22   Calcium 8.9 - 10.3 mg/dL 8.8(L) 8.5(L) 8.7(L)  Total Protein 6.5 - 8.1 g/dL 5.8(L) 5.2(L) 5.6(L)  Total Bilirubin 0.3 - 1.2 mg/dL 0.9 0.7 0.4  Alkaline Phos 38 - 126 U/L 65 69 80  AST 15 - 41 U/L 17 24 50(H)  ALT 0 - 44 U/L 43 50(H) 60(H)    Consults: None  Procedures: None  Disposition:  Discharge disposition: 01-Home or Self Care      Allergies as of 07/18/2020   No Known Allergies     Medication List    TAKE these medications   acetaminophen 325 MG tablet Commonly known as: TYLENOL Take 2 tablets (650 mg total) by mouth every 4 (four) hours as needed for mild pain.   Acetaminophen-Codeine 300-30 MG tablet TAKE 1 TABLET BY MOUTH EVERY 6 (FOUR) HOURS AS NEEDED FOR MODERATE PAIN What changed:  additional instructions   dexamethasone 4 MG tablet Commonly known as: DECADRON Take 1 tablet twice a day for 3 days starting the day after each cycle of chemo What changed:   how much to take  how to take this  when to take this   dronabinol 5 MG capsule Commonly known as: MARINOL Take 1 capsule (5 mg total) by mouth 2 (two) times daily before lunch and supper. What changed:   when to take this  reasons to take this   lidocaine-prilocaine cream Commonly known as: EMLA Apply 1 application topically as needed. Apply to port site What changed:   reasons to take this  additional instructions   LORazepam 0.5 MG tablet Commonly known as: ATIVAN Take 1 tablet (0.5 mg total) by mouth every 8 (eight) hours as needed for anxiety.   ondansetron 8 MG tablet Commonly known as: ZOFRAN TAKE 1 TABLET BY MOUTH EVERY 8 HOURS AS NEEDED FOR NAUSEA OR VOMITING. What changed:   how to take this  when to take this  reasons to take this  additional instructions   polyethylene glycol 17 g packet Commonly known as: MIRALAX / GLYCOLAX Take 17 g by mouth daily as needed. What changed: when to take this  prochlorperazine 10 MG tablet Commonly known as: COMPAZINE TAKE 1 TABLET BY MOUTH EVERY 6 HOURS AS NEEDED FOR NAUSEA OR VOMITING. What changed:   how much to take  how to take this  when to take this  reasons to take this  additional instructions   senna-docusate 8.6-50 MG tablet Commonly known as: Senokot-S Take 1 tablet by mouth 2 (two) times daily as needed for mild constipation.   sodium bicarbonate/sodium chloride Soln 1 application by Mouth Rinse route 4 (four) times daily.   Ziextenzo 6 MG/0.6ML injection Generic drug: pegfilgrastim-bmez Inject 0.6 mLs (6 mg total) into the skin See admin instructions. Inject 0.6 mLs (6 mg total) into the skin once for 1 dose. 24 hours after each cycle of EPOCH. To be administered by Home Health RN arranged by  [facility]oInject 6 mg into the skin See admin instructions.         HPI:  Bryan [name] is a 65 y.o. male who has been referred to [place] by Dr. Tammi Klippel for evaluation and management of lung mass.  About 6 months ago pt began having abdominal pain after eating. He saw several physicians who could not explain his symptoms.He then developedextreme discomfort in his chest and red stools. He is unsure if the color of his stools was from blood or diet. Pt then began experiencing left arm pain and pleuritic chest pain, which triggered his Chest CTAin October.CTA chest ruled out PE but showed bulky mediastinal and bilateral hilar adenopathy. He was referred to hematology oncology at [facility] and a PET scan and biopsy were ordered. There were significant delays in getting these tests completed. Bryan [name] then establish care here in [place], [place] with hopes of expediting his work-up. He underwent a PET scan on 04/08/2020 which showed intensely hypermetabolic bilateral neck, bilateral axillary, bilateral mediastinal, bilateral hilar, and retroperitoneal lymphadenopathy, numerous small hypermetabolic splenic masses. He then had an ultrasound-guided biopsy of a left supraclavicular lymph node which showed diffuse large B-cell lymphoma. MRI of the brain with and without contrast was performed on 04/18/2020 which showed no acute intracranial process and no evidence for metastatic disease. It was recommended for the patient to begin systemic chemotherapy with EPOCH-R.  A restaging PET scan 05/28/2020 revealed a marked interval response to therapy with resolution of almost all areas of increased FDG activity. No sign of progressive disease.  The patient was seen on the day of admission prior to cycle #5 of his chemotherapy.  On admission, the patient had been experiencing increased headaches and mild confusion leading up to the day of admission.  However, he felt much better on the day of  admission.  He was not having any fevers, chills, mucositis, chest pain, shortness of breath, cough, abdominal pain, nausea, vomiting, constipation.  The patient preferred not to have intrathecal chemotherapy because he related his headaches to the prior intrathecal chemotherapy that he received aside #4.  The patient was admitted 07/14/2020 to begin cycle #5 of chemotherapy.    Hospital Course: Bryan [name] started his chemotherapy as planned on the day of admission.  He overall tolerated his chemotherapy well with the exception of grade 1 nausea.  No vomiting was reported this admission.  Nausea was overall well controlled with as needed antiemetics.  He did not develop any mucositis this admission.  He developed very mild hypokalemia and was started on potassium chloride once a day on 2/10 and 2/11.  This medication will not be continued as an outpatient.  The patient was  seen on the morning of 07/18/2020 and was deemed to be stable for discharge.  The patient will discharge to home once chemotherapy is complete.  Refills were provided for lorazepam and Tylenol #3.  He was advised to limit his use of Tylenol #3 as this can cause headaches and also confusion/dissociative state.  Instructions for Tylenol #3 were changed to every 6 hours as needed for moderate pain.  The patient will administer G-CSF at home on 07/19/2020.  He will return to our office on 07/21/2020 for rituximab and will have labs and a follow-up visit on 07/25/2020.  Physical Exam Vitals reviewed.  Constitutional:      General: He is not in acute distress.    Appearance: Normal appearance.  HENT:     Head: Normocephalic and atraumatic.     Mouth/Throat:     Pharynx: Oropharynx is clear. No oropharyngeal exudate or posterior oropharyngeal erythema.  Eyes:     General: No scleral icterus.    Pupils: Pupils are equal, round, and reactive to light.  Cardiovascular:     Rate and Rhythm: Normal rate and regular rhythm.  Pulmonary:      Effort: Pulmonary effort is normal. No respiratory distress.     Breath sounds: Normal breath sounds.  Abdominal:     General: Abdomen is flat. Bowel sounds are normal. There is no distension.     Palpations: Abdomen is soft.  Musculoskeletal:        General: Normal range of motion.  Skin:    General: Skin is warm and dry.     Findings: No rash.  Neurological:     General: No focal deficit present.     Mental Status: He is alert and oriented to person, place, and time. Mental status is at baseline.  Psychiatric:        Mood and Affect: Mood normal.        Behavior: Behavior normal.        Thought Content: Thought content normal.        Judgment: Judgment normal.    Discharge Instructions    Activity as tolerated - No restrictions   Complete by: As directed    Diet general   Complete by: As directed       Signed: Mikey Bussing 07/18/2020, 8:37 AM    ADDENDUM  .Patient was Personally and independently interviewed, examined and relevant elements of the discharge plan were reviewed in details with Mikey Bussing DNP. The above documentation reflects our combined findings assessment and plan.  Sullivan Lone MD [place] TT >30 mins

## 2020-07-21 ENCOUNTER — Inpatient Hospital Stay: Payer: 59 | Attending: Hematology

## 2020-07-21 ENCOUNTER — Other Ambulatory Visit: Payer: Self-pay

## 2020-07-21 VITALS — BP 91/54 | HR 66 | Temp 97.6°F | Resp 20 | Wt 136.8 lb

## 2020-07-21 DIAGNOSIS — I700 Atherosclerosis of aorta: Secondary | ICD-10-CM | POA: Diagnosis not present

## 2020-07-21 DIAGNOSIS — C8338 Diffuse large B-cell lymphoma, lymph nodes of multiple sites: Secondary | ICD-10-CM

## 2020-07-21 DIAGNOSIS — Z5112 Encounter for antineoplastic immunotherapy: Secondary | ICD-10-CM | POA: Diagnosis not present

## 2020-07-21 DIAGNOSIS — C8598 Non-Hodgkin lymphoma, unspecified, lymph nodes of multiple sites: Secondary | ICD-10-CM | POA: Insufficient documentation

## 2020-07-21 DIAGNOSIS — Z8546 Personal history of malignant neoplasm of prostate: Secondary | ICD-10-CM | POA: Diagnosis not present

## 2020-07-21 DIAGNOSIS — Z7189 Other specified counseling: Secondary | ICD-10-CM

## 2020-07-21 MED ORDER — HEPARIN SOD (PORK) LOCK FLUSH 100 UNIT/ML IV SOLN
500.0000 [IU] | Freq: Once | INTRAVENOUS | Status: AC | PRN
  Administered 2020-07-21: 500 [IU]
  Filled 2020-07-21: qty 5

## 2020-07-21 MED ORDER — MONTELUKAST SODIUM 10 MG PO TABS
ORAL_TABLET | ORAL | Status: AC
  Filled 2020-07-21: qty 1

## 2020-07-21 MED ORDER — MONTELUKAST SODIUM 10 MG PO TABS
10.0000 mg | ORAL_TABLET | Freq: Every day | ORAL | Status: DC
  Administered 2020-07-21: 10 mg via ORAL

## 2020-07-21 MED ORDER — SODIUM CHLORIDE 0.9 % IV SOLN
375.0000 mg/m2 | Freq: Once | INTRAVENOUS | Status: AC
  Administered 2020-07-21: 600 mg via INTRAVENOUS
  Filled 2020-07-21: qty 10

## 2020-07-21 MED ORDER — ACETAMINOPHEN 325 MG PO TABS
650.0000 mg | ORAL_TABLET | Freq: Once | ORAL | Status: AC
  Administered 2020-07-21: 650 mg via ORAL

## 2020-07-21 MED ORDER — DIPHENHYDRAMINE HCL 25 MG PO CAPS
50.0000 mg | ORAL_CAPSULE | Freq: Once | ORAL | Status: AC
  Administered 2020-07-21: 50 mg via ORAL

## 2020-07-21 MED ORDER — METHYLPREDNISOLONE SODIUM SUCC 125 MG IJ SOLR
80.0000 mg | Freq: Every day | INTRAMUSCULAR | Status: DC
  Administered 2020-07-21: 80 mg via INTRAVENOUS

## 2020-07-21 MED ORDER — DIPHENHYDRAMINE HCL 25 MG PO CAPS
ORAL_CAPSULE | ORAL | Status: AC
  Filled 2020-07-21: qty 2

## 2020-07-21 MED ORDER — METHYLPREDNISOLONE SODIUM SUCC 125 MG IJ SOLR
INTRAMUSCULAR | Status: AC
  Filled 2020-07-21: qty 2

## 2020-07-21 MED ORDER — SODIUM CHLORIDE 0.9% FLUSH
10.0000 mL | INTRAVENOUS | Status: DC | PRN
  Administered 2020-07-21: 10 mL
  Filled 2020-07-21: qty 10

## 2020-07-21 MED ORDER — FAMOTIDINE IN NACL 20-0.9 MG/50ML-% IV SOLN
INTRAVENOUS | Status: AC
  Filled 2020-07-21: qty 50

## 2020-07-21 MED ORDER — SODIUM CHLORIDE 0.9 % IV SOLN
Freq: Once | INTRAVENOUS | Status: AC
  Filled 2020-07-21: qty 250

## 2020-07-21 MED ORDER — ACETAMINOPHEN 325 MG PO TABS
ORAL_TABLET | ORAL | Status: AC
  Filled 2020-07-21: qty 2

## 2020-07-21 MED ORDER — FAMOTIDINE IN NACL 20-0.9 MG/50ML-% IV SOLN
20.0000 mg | Freq: Once | INTRAVENOUS | Status: AC
  Administered 2020-07-21: 20 mg via INTRAVENOUS

## 2020-07-21 NOTE — Progress Notes (Signed)
Pt. BP 86/54, denies complaints of chest pain, dizziness, and no shortness of breath noted. Sandi Mealy PA notified and stated to continue with treatment as ordered. Pt. BP 91/54 at completion of treatment and denies chest pain, dizziness, and no shortness of breath noted. Per Sandi Mealy, PA, discharge pt.  home without additional fluids and instructed pt. to increase his water/fluid intake and call with any issues or concerns. Pt. states he understands.

## 2020-07-21 NOTE — Patient Instructions (Signed)
[facility] Discharge Instructions for Patients Receiving Chemotherapy  Today you received the following chemotherapy agent: Rituximab  To help prevent nausea and vomiting after your treatment, we encourage you to take your nausea medication as directed by your MD.   If you develop nausea and vomiting that is not controlled by your nausea medication, call the clinic.   BELOW ARE SYMPTOMS THAT SHOULD BE REPORTED IMMEDIATELY:  *FEVER GREATER THAN 100.5 F  *CHILLS WITH OR WITHOUT FEVER  NAUSEA AND VOMITING THAT IS NOT CONTROLLED WITH YOUR NAUSEA MEDICATION  *UNUSUAL SHORTNESS OF BREATH  *UNUSUAL BRUISING OR BLEEDING  TENDERNESS IN MOUTH AND THROAT WITH OR WITHOUT PRESENCE OF ULCERS  *URINARY PROBLEMS  *BOWEL PROBLEMS  UNUSUAL RASH Items with * indicate a potential emergency and should be followed up as soon as possible.  Feel free to call the clinic should you have any questions or concerns. The clinic phone number is [phone].  Please show the [place] at check-in to the Emergency Department and triage nurse.

## 2020-07-24 NOTE — Progress Notes (Signed)
HEMATOLOGY/ONCOLOGY CONSULTATION NOTE  Date of Service: 07/24/2020  Patient Care Team: System, Provider Not In as PCP - General  CHIEF COMPLAINTS/PURPOSE OF CONSULTATION:  mx of large B cell lymphoma  HISTORY OF PRESENTING ILLNESS:   Bryan [name] is a wonderful 65 y.o. male who has been referred to [place] by Dr. Tammi Klippel for evaluation and management of lung mass. Pt is accompanied today by his wife. The pt reports that he is doing well overall.   The pt reports that he was diagnosed with low-grade Prostate Cancer in 2013. Pt had no symptoms, but got a check up after his father was diagnosed with Prostate Cancer. He was only treated with cryoablation. He also has a reducible, right inguinal hernia. Pt denies any other medical conditions or chronic medications. He does take a daily fish oil and multivitamin. He has NKDA.   About 6 months ago pt began having abdominal pain after eating. He saw several physicians who could not explain his symptoms. Last month he began having extreme discomfort in his chest and red stools. He is unsure if the color of his stools was from blood or diet. Pt then began experiencing left arm pain and pleuritic chest pain, which triggered his Chest CT in October.  In the last few nights pt has had roaming headaches that are improved with OTC Tylenol. Pt has lost 4-5 pounds recently, but attributes this to retiring and running more. Pt is currently taking two Oxycodone twice per day, which is controlling his discomfort. His wife also notes that the pt appears more fatigued than usual and is napping more. Pt felt poorly for 1 week after his second COVID19 vaccine, which he recived in March. Pt received the [place] vaccine, but waiting to receive the booster.   Of note prior to the patient's visit today, pt has had PET/CT ([id number]) completed on 04/08/2020 with results revealing "1. Intensely hypermetabolic bilateral neck, bilateral axillary, bilateral mediastinal,  bilateral hilar and retroperitoneal lymphadenopathy. Numerous small hypermetabolic splenic masses. Findings are compatible with malignancy, favoring lymphoproliferative disorder. The most accessible pathologic lymph nodes for potential percutaneous biopsy are likely within the left axilla and left supraclavicular neck."   Most recent lab results (03/31/2020) of CBC is as follows: all values are WNL except for HCT at 41.2, MPV at 9.7, Neutro Rel at 70.3, Immature Gran Rel at 0.4, Lymphs Rel at 15.8, Mono Rel at 10.3. 03/31/2020 D-Dimer at 0.51  On review of systems, pt reports headaches, chest pain, improved diarrhea, fatigue, mild productive cough, abdominal fullness and denies fevers, chills, night sweats, unexpected weight loss, low appetite, SOB, testicular pain/swelling, leg swelling, rash and any other symptoms.   On PMHx the pt reports Prostate Cancer, Cryotherapy, Inguinal hernia. 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 On Family Hx the pt reports a father with Prostate Cancer and a mother with Esophageal and Bladder Cancer (was a smoker).   INTERVAL HISTORY:  Bryan [name] is a wonderful 65 y.o. male who is here for evaluation and management of newly diagnosed Large B-cell lymphoma. We are joined today by his wife. The patient's last visit with [place] was on 07/07/2020. The pt reports that he is doing well overall.  The pt reports that he has been gradually improving. He has felt better the last 2-3 days. He notes a major difference between C4 and C5 with the Intrathecal and without it. The pt notes that his confidence has really improved. The pt's  headaches have been intermittent and moderate. He notes some alternation of constipation and diarrhea due to differences in diet. The pt notes he feels slightly fatigued.  Lab results today 07/25/2020 of CBC w/diff and CMP is as follows: all values are WNL except for  RBC of 2.80, Hgb of 8.6, HCT of 25.3, RDW of 15.6, Sodium of  134, glucose of 110, BUN of 7, Calcium of 8.4, Total protein of 5.7, Albumin of 3.4.   On review of systems, pt reports mild cumulative fatigue, intermittent headaches, changes in bowel habits, minor tingling in hands/feet and denies unexplained dizziness, lightheadedness, SOB, abdominal pain, back pain, leg swelling and any other symptoms.  MEDICAL HISTORY:  Past Medical History:  Diagnosis Date  . Prostate cancer [facility])     SURGICAL HISTORY: Past Surgical History:  Procedure Laterality Date  . CRYOTHERAPY    . IR IMAGING GUIDED PORT INSERTION  05/08/2020  . PROSTATE SURGERY      SOCIAL HISTORY: Social History   Socioeconomic History  . Marital status: Married    Spouse name: 1  . Number of children: Not on file  . Years of education: Not on file  . Highest education level: Not on file  Occupational History  . Occupation: Futures trader: [employer]    Comment: medical leave  Tobacco Use  . Smoking status: Never Smoker  . Smokeless tobacco: Never Used  Vaping Use  . Vaping Use: Never used  Substance and Sexual Activity  . Alcohol use: Not Currently  . Drug use: Never  . Sexual activity: Yes  Other Topics Concern  . Not on file  Social History Narrative  . Not on file   Social Determinants of Health   Financial Resource Strain: Not on file  Food Insecurity: Not on file  Transportation Needs: Not on file  Physical Activity: Not on file  Stress: Not on file  Social Connections: Not on file  Intimate Partner Violence: Not on file    FAMILY HISTORY: Family History  Problem Relation Age of Onset  . Bladder Cancer Mother   . Esophageal cancer Mother   . Prostate cancer Father   . Prostate cancer Paternal Uncle     ALLERGIES:  has No Known Allergies.  MEDICATIONS:  Current Outpatient Medications  Medication Sig Dispense Refill  . acetaminophen (TYLENOL) 325 MG tablet Take 2 tablets (650 mg total) by mouth every 4 (four) hours as needed for mild pain.  (Patient not taking: Reported on 07/14/2020)    . Acetaminophen-Codeine 300-30 MG tablet TAKE 1 TABLET BY MOUTH EVERY 6 (FOUR) HOURS AS NEEDED FOR MODERATE PAIN 30 tablet 0  . dexamethasone (DECADRON) 4 MG tablet Take 1 tablet twice a day for 3 days starting the day after each cycle of chemo (Patient taking differently: Take 4 mg by mouth See admin instructions. Take 1 tablet twice a day for 3 days starting the day after each cycle of chemo) 20 tablet 1  . dronabinol (MARINOL) 5 MG capsule Take 1 capsule (5 mg total) by mouth 2 (two) times daily before lunch and supper. (Patient taking differently: Take 5 mg by mouth 2 (two) times daily as needed (appetite).) 60 capsule 0  . lidocaine-prilocaine (EMLA) cream Apply 1 application topically as needed. Apply to port site (Patient taking differently: Apply 1 application topically as needed (port access).) 30 g 0  . LORazepam (ATIVAN) 0.5 MG tablet Take 1 tablet (0.5 mg total) by mouth every 8 (eight) hours as  needed for anxiety. 30 tablet 0  . ondansetron (ZOFRAN) 8 MG tablet TAKE 1 TABLET BY MOUTH EVERY 8 HOURS AS NEEDED FOR NAUSEA OR VOMITING. (Patient taking differently: Take by mouth every 8 (eight) hours as needed for nausea or vomiting.) 30 tablet 0  . pegfilgrastim-bmez (ZIEXTENZO) 6 MG/0.6ML injection Inject 0.6 mLs (6 mg total) into the skin See admin instructions. Inject 0.6 mLs (6 mg total) into the skin once for 1 dose. 24 hours after each cycle of EPOCH. To be administered by Home Health RN arranged by [facility] 6 mg into the skin See admin instructions. 0.6 mL 4  . polyethylene glycol (MIRALAX / GLYCOLAX) 17 g packet Take 17 g by mouth daily as needed. (Patient taking differently: Take 17 g by mouth daily.) 14 each 0  . prochlorperazine (COMPAZINE) 10 MG tablet TAKE 1 TABLET BY MOUTH EVERY 6 HOURS AS NEEDED FOR NAUSEA OR VOMITING. (Patient taking differently: Take 10 mg by mouth every 6 (six) hours as needed for nausea or vomiting.) 30 tablet  2  . senna-docusate (SENOKOT-S) 8.6-50 MG tablet Take 1 tablet by mouth 2 (two) times daily as needed for mild constipation.    . Sodium Chloride-Sodium Bicarb (SODIUM BICARBONATE/SODIUM CHLORIDE) SOLN 1 application by Mouth Rinse route 4 (four) times daily.     No current facility-administered medications for this visit.    REVIEW OF SYSTEMS:   10 Point review of Systems was done is negative except as noted above.  PHYSICAL EXAMINATION: ECOG PERFORMANCE STATUS: 1 - Symptomatic but completely ambulatory  . Vitals:   07/25/20 1204  BP: (!) 96/59  Pulse: 65  Resp: 18  Temp: (!) 97 F (36.1 C)  SpO2: 100%   Filed Weights   07/25/20 1204  Weight: 136 lb 9.6 oz (62 kg)   .Body mass index is 21.39 kg/m.   GENERAL:alert, in no acute distress and comfortable SKIN: no acute rashes, no significant lesions EYES: conjunctiva are pink and non-injected, sclera anicteric OROPHARYNX: MMM, no exudates, no oropharyngeal erythema or ulceration NECK: supple, no JVD LYMPH:  no palpable lymphadenopathy in the cervical, axillary or inguinal regions LUNGS: clear to auscultation b/l with normal respiratory effort HEART: regular rate & rhythm ABDOMEN:  normoactive bowel sounds , non tender, not distended. Extremity: no pedal edema PSYCH: alert & oriented x 3 with fluent speech NEURO: no focal motor/sensory deficits  LABORATORY DATA:  I have reviewed the data as listed  . CBC Latest Ref Rng & Units 07/25/2020 07/18/2020 07/17/2020  WBC 4.0 - 10.5 K/uL 5.8 8.1 15.2(H)  Hemoglobin 13.0 - 17.0 g/dL 8.6(L) 9.5(L) 8.9(L)  Hematocrit 39.0 - 52.0 % 25.3(L) 28.2(L) 26.1(L)  Platelets 150 - 400 K/uL 176 460(H) 551(H)   . CMP Latest Ref Rng & Units 07/25/2020 07/18/2020 07/17/2020  Glucose 70 - 99 mg/dL 110(H) 94 89  BUN 8 - 23 mg/dL 7(L) 16 17  Creatinine 0.61 - 1.24 mg/dL 0.72 0.65 0.68  Sodium 135 - 145 mmol/L 134(L) 139 139  Potassium 3.5 - 5.1 mmol/L 4.0 3.5 3.4(L)  Chloride 98 - 111 mmol/L 102  106 105  CO2 22 - 32 mmol/L 27 24 24   Calcium 8.9 - 10.3 mg/dL 8.4(L) 8.8(L) 8.5(L)  Total Protein 6.5 - 8.1 g/dL 5.7(L) 5.8(L) 5.2(L)  Total Bilirubin 0.3 - 1.2 mg/dL 0.3 0.9 0.7  Alkaline Phos 38 - 126 U/L 100 65 69  AST 15 - 41 U/L 22 17 24   ALT 0 - 44 U/L 44 43 50(H)  04/10/2020 FISH Analysis [phone]):   04/10/2020 Left Cervical Lymph Node Bx ([id number]):    RADIOGRAPHIC STUDIES: I have personally reviewed the radiological images as listed and agreed with the findings in the report. No results found.  ASSESSMENT & PLAN:   1) Recently diagnosed advanced stage IIIA Large B cell lymphoma - activated B cell type 04/08/2020 PET/CT ([id number]) revealed "1. Intensely hypermetabolic bilateral neck, bilateral axillary, bilateral mediastinal, bilateral hilar and retroperitoneal lymphadenopathy. Numerous small hypermetabolic splenic masses. Findings are compatible with malignancy, favoring lymphoproliferative disorder." 04/10/2020 Left Cervical Lymph Node Bx ([id number]) revealed "Diffuse large B-cell lymphoma. KI-67 is 80-90%" FISH panel neg for double/triplet hit lymphoma PET/CT 12/22 "1. Marked interval response to therapy with resolution of nearly all areas of increased FDG uptake compared to the previous study. Most areas with residual uptake Deauville category 3. Subcarinal and RIGHT thoracic inlet with uptake slightly greater than liver, technically Deauville category 4 as described. Maximum SUV on today's study is 4.3 in the subcarinal region. 2. No new signs of disease. 3. Diffuse marrow activity presumably related to marrow stimulation. Suggest attention on follow-up. Aortic Atherosclerosis (ICD10-I70.0)."   PLAN: -Discussed pt labwork today, 07/25/2020; some anemia, WBC and Plt good.  -Advised pt that unless he experiences dizziness and extreme fatigue, there is not a need for transfusion support at this time. -The pt has no prohibitive toxicities from continuing C6  EPOCH-R. Plan to continue through six cycles. -Advised pt that we have taken the aggressive approach due to inability to repeat some of the medicines and pt's ability to tolerate a level above standard dosage. 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 Continue B-Complex to help with this. Recommended keeping fingers and toes warm. -Advised pt we will do repeat PET 4 weeks following completion of C6. Will get labs 2 weeks following treatment. -Will continue to hold Intrathecal Methotrexate at this time for C6 as done with C5.  -Recommend pt continue to drink 48-64 oz water daily, eat well, walk 20-30 min daily, and get proper sleep. -Recommended pt try to eat at least 3000 calories daily prior to next treatment.  -Continue 2000 IU Vitamin D and Vitamin B complex once daily. -Continue Marinol 5 mg BID -Will see back on 08/18/2020 with labs.    FOLLOW UP: -Outpatient Covid screening 24 to 48 hours prior to inpatient admission -Inpatient admission for 5 days for cycle 6 of EPOCH chemotherapy from 08/04/20 -Outpatient Rituxan on 08/11/2020 -Outpatient follow-up with Dr. Irene Limbo with port flush and labs on 08/18/2020    The total time spent in the appointment was 30 minutes and more than 50% was on counseling and direct patient cares, ordering and co-ordinating chemotherapy.   All of the patient's questions were answered with apparent satisfaction. The patient knows to call the clinic with any problems, questions or concerns.    Sullivan Lone MD [place] AAHIVMS [facility] Hematology/Oncology Physician [facility]  (Office):       [phone] (Work cell):  [phone] (Fax):           [phone]  07/24/2020 12:56 PM  I, Reinaldo Raddle, am acting as scribe for Dr. Sullivan Lone, MD.    .I have reviewed the above documentation for accuracy and completeness, and I agree with the above. Brunetta Genera MD

## 2020-07-25 ENCOUNTER — Inpatient Hospital Stay: Payer: 59

## 2020-07-25 ENCOUNTER — Other Ambulatory Visit: Payer: Self-pay

## 2020-07-25 ENCOUNTER — Other Ambulatory Visit: Payer: Self-pay | Admitting: *Deleted

## 2020-07-25 ENCOUNTER — Inpatient Hospital Stay (HOSPITAL_BASED_OUTPATIENT_CLINIC_OR_DEPARTMENT_OTHER): Payer: 59 | Admitting: Hematology

## 2020-07-25 VITALS — BP 96/59 | HR 65 | Temp 97.0°F | Resp 18 | Ht 67.01 in | Wt 136.6 lb

## 2020-07-25 DIAGNOSIS — C8338 Diffuse large B-cell lymphoma, lymph nodes of multiple sites: Secondary | ICD-10-CM

## 2020-07-25 DIAGNOSIS — Z5112 Encounter for antineoplastic immunotherapy: Secondary | ICD-10-CM | POA: Diagnosis not present

## 2020-07-25 DIAGNOSIS — D6489 Other specified anemias: Secondary | ICD-10-CM

## 2020-07-25 DIAGNOSIS — Z95828 Presence of other vascular implants and grafts: Secondary | ICD-10-CM

## 2020-07-25 LAB — CMP (CANCER CENTER ONLY)
ALT: 44 U/L (ref 0–44)
AST: 22 U/L (ref 15–41)
Albumin: 3.4 g/dL — ABNORMAL LOW (ref 3.5–5.0)
Alkaline Phosphatase: 100 U/L (ref 38–126)
Anion gap: 5 (ref 5–15)
BUN: 7 mg/dL — ABNORMAL LOW (ref 8–23)
CO2: 27 mmol/L (ref 22–32)
Calcium: 8.4 mg/dL — ABNORMAL LOW (ref 8.9–10.3)
Chloride: 102 mmol/L (ref 98–111)
Creatinine: 0.72 mg/dL (ref 0.61–1.24)
GFR, Estimated: >60 mL/min (ref >60)
Glucose, Bld: 110 mg/dL — ABNORMAL HIGH (ref 70–99)
Potassium: 4 mmol/L (ref 3.5–5.1)
Sodium: 134 mmol/L — ABNORMAL LOW (ref 135–145)
Total Bilirubin: 0.3 mg/dL (ref 0.3–1.2)
Total Protein: 5.7 g/dL — ABNORMAL LOW (ref 6.5–8.1)

## 2020-07-25 LAB — CBC WITH DIFFERENTIAL (CANCER CENTER ONLY)
Abs Immature Granulocytes: 0.1 10*3/uL — ABNORMAL HIGH (ref 0.00–0.07)
Basophils Absolute: 0.1 10*3/uL (ref 0.0–0.1)
Basophils Relative: 2 %
Eosinophils Absolute: 0.2 10*3/uL (ref 0.0–0.5)
Eosinophils Relative: 3 %
HCT: 25.3 % — ABNORMAL LOW (ref 39.0–52.0)
Hemoglobin: 8.6 g/dL — ABNORMAL LOW (ref 13.0–17.0)
Immature Granulocytes: 2 %
Lymphocytes Relative: 11 %
Lymphs Abs: 0.6 10*3/uL — ABNORMAL LOW (ref 0.7–4.0)
MCH: 30.7 pg (ref 26.0–34.0)
MCHC: 34 g/dL (ref 30.0–36.0)
MCV: 90.4 fL (ref 80.0–100.0)
Monocytes Absolute: 0.8 10*3/uL (ref 0.1–1.0)
Monocytes Relative: 14 %
Neutro Abs: 4.1 10*3/uL (ref 1.7–7.7)
Neutrophils Relative %: 68 %
Platelet Count: 176 10*3/uL (ref 150–400)
RBC: 2.8 MIL/uL — ABNORMAL LOW (ref 4.22–5.81)
RDW: 15.6 % — ABNORMAL HIGH (ref 11.5–15.5)
WBC Count: 5.8 10*3/uL (ref 4.0–10.5)
nRBC: 0 % (ref 0.0–0.2)

## 2020-07-25 MED ORDER — SODIUM CHLORIDE 0.9% FLUSH
10.0000 mL | Freq: Once | INTRAVENOUS | Status: AC
  Administered 2020-07-25: 10 mL
  Filled 2020-07-25: qty 10

## 2020-07-25 MED ORDER — HEPARIN SOD (PORK) LOCK FLUSH 100 UNIT/ML IV SOLN
500.0000 [IU] | Freq: Once | INTRAVENOUS | Status: AC
  Administered 2020-07-25: 500 [IU]
  Filled 2020-07-25: qty 5

## 2020-07-31 ENCOUNTER — Telehealth: Payer: Self-pay | Admitting: *Deleted

## 2020-07-31 NOTE — Telephone Encounter (Signed)
Scheduled for Monday2/28/22AM admissionperMargotin patient placement. Covid screen is scheduled for Saturday2/26/22 at 1110 am Email sent to #[email or web address] Patient/wifeinformed of all times and locationsvia MyChart

## 2020-08-02 ENCOUNTER — Other Ambulatory Visit (HOSPITAL_COMMUNITY)
Admission: RE | Admit: 2020-08-02 | Discharge: 2020-08-02 | Disposition: A | Discharge status: Home / Self Care | Payer: 59 | Source: Ambulatory Visit | Attending: Hematology | Admitting: Hematology

## 2020-08-02 DIAGNOSIS — Z20822 Contact with and (suspected) exposure to covid-19: Secondary | ICD-10-CM | POA: Insufficient documentation

## 2020-08-02 DIAGNOSIS — Z01812 Encounter for preprocedural laboratory examination: Secondary | ICD-10-CM | POA: Insufficient documentation

## 2020-08-02 LAB — SARS CORONAVIRUS 2 (TAT 6-24 HRS): SARS Coronavirus 2: NEGATIVE

## 2020-08-04 ENCOUNTER — Inpatient Hospital Stay (HOSPITAL_COMMUNITY)
Admission: RE | Admit: 2020-08-04 | Discharge: 2020-08-08 | DRG: 847 | Disposition: A | Discharge status: Home / Self Care | Payer: 59 | Source: Ambulatory Visit | Attending: Hematology | Admitting: Hematology

## 2020-08-04 ENCOUNTER — Other Ambulatory Visit: Payer: Self-pay

## 2020-08-04 ENCOUNTER — Encounter (HOSPITAL_COMMUNITY): Payer: Self-pay | Admitting: Hematology

## 2020-08-04 DIAGNOSIS — Z8052 Family history of malignant neoplasm of bladder: Secondary | ICD-10-CM

## 2020-08-04 DIAGNOSIS — Z800 Family history of malignant neoplasm of digestive organs: Secondary | ICD-10-CM | POA: Diagnosis not present

## 2020-08-04 DIAGNOSIS — Z95828 Presence of other vascular implants and grafts: Secondary | ICD-10-CM

## 2020-08-04 DIAGNOSIS — Z8546 Personal history of malignant neoplasm of prostate: Secondary | ICD-10-CM | POA: Diagnosis not present

## 2020-08-04 DIAGNOSIS — Z8042 Family history of malignant neoplasm of prostate: Secondary | ICD-10-CM | POA: Diagnosis not present

## 2020-08-04 DIAGNOSIS — C8330 Diffuse large B-cell lymphoma, unspecified site: Secondary | ICD-10-CM

## 2020-08-04 DIAGNOSIS — Z20822 Contact with and (suspected) exposure to covid-19: Secondary | ICD-10-CM | POA: Diagnosis present

## 2020-08-04 DIAGNOSIS — Z7189 Other specified counseling: Secondary | ICD-10-CM

## 2020-08-04 DIAGNOSIS — C8338 Diffuse large B-cell lymphoma, lymph nodes of multiple sites: Secondary | ICD-10-CM | POA: Diagnosis present

## 2020-08-04 DIAGNOSIS — Z5111 Encounter for antineoplastic chemotherapy: Secondary | ICD-10-CM | POA: Diagnosis present

## 2020-08-04 DIAGNOSIS — C8598 Non-Hodgkin lymphoma, unspecified, lymph nodes of multiple sites: Secondary | ICD-10-CM

## 2020-08-04 DIAGNOSIS — Z8059 Family history of malignant neoplasm of other urinary tract organ: Secondary | ICD-10-CM

## 2020-08-04 LAB — CBC WITH DIFFERENTIAL/PLATELET
Abs Immature Granulocytes: 1.8 10*3/uL — ABNORMAL HIGH (ref 0.00–0.07)
Band Neutrophils: 7 %
Basophils Absolute: 0.2 10*3/uL — ABNORMAL HIGH (ref 0.0–0.1)
Basophils Relative: 1 %
Eosinophils Absolute: 0.3 10*3/uL (ref 0.0–0.5)
Eosinophils Relative: 2 %
HCT: 31.9 % — ABNORMAL LOW (ref 39.0–52.0)
Hemoglobin: 10.4 g/dL — ABNORMAL LOW (ref 13.0–17.0)
Lymphocytes Relative: 7 %
Lymphs Abs: 1.1 10*3/uL (ref 0.7–4.0)
MCH: 31.2 pg (ref 26.0–34.0)
MCHC: 32.6 g/dL (ref 30.0–36.0)
MCV: 95.8 fL (ref 80.0–100.0)
Metamyelocytes Relative: 2 %
Monocytes Absolute: 0.8 10*3/uL (ref 0.1–1.0)
Monocytes Relative: 5 %
Myelocytes: 9 %
Neutro Abs: 12.1 10*3/uL — ABNORMAL HIGH (ref 1.7–7.7)
Neutrophils Relative %: 67 %
Platelets: 313 10*3/uL (ref 150–400)
RBC: 3.33 MIL/uL — ABNORMAL LOW (ref 4.22–5.81)
RDW: 17 % — ABNORMAL HIGH (ref 11.5–15.5)
WBC: 16.4 10*3/uL — ABNORMAL HIGH (ref 4.0–10.5)
nRBC: 0 % (ref 0.0–0.2)

## 2020-08-04 LAB — COMPREHENSIVE METABOLIC PANEL
ALT: 27 U/L (ref 0–44)
AST: 23 U/L (ref 15–41)
Albumin: 3.6 g/dL (ref 3.5–5.0)
Alkaline Phosphatase: 88 U/L (ref 38–126)
Anion gap: 11 (ref 5–15)
BUN: 10 mg/dL (ref 8–23)
CO2: 22 mmol/L (ref 22–32)
Calcium: 9.2 mg/dL (ref 8.9–10.3)
Chloride: 105 mmol/L (ref 98–111)
Creatinine, Ser: 0.66 mg/dL (ref 0.61–1.24)
GFR, Estimated: >60 mL/min (ref >60)
Glucose, Bld: 110 mg/dL — ABNORMAL HIGH (ref 70–99)
Potassium: 4.3 mmol/L (ref 3.5–5.1)
Sodium: 138 mmol/L (ref 135–145)
Total Bilirubin: 0.5 mg/dL (ref 0.3–1.2)
Total Protein: 6.3 g/dL — ABNORMAL LOW (ref 6.5–8.1)

## 2020-08-04 MED ORDER — DRONABINOL 5 MG PO CAPS: 5.0000 mg | ORAL_CAPSULE | Freq: Two times a day (BID) | ORAL | Status: DC | PRN

## 2020-08-04 MED ORDER — HOT PACK MISC ONCOLOGY
1.0000 | Freq: Once | Status: DC | PRN
  Filled 2020-08-04: qty 1

## 2020-08-04 MED ORDER — ONDANSETRON HCL 8 MG PO TABS
8.0000 mg | ORAL_TABLET | Freq: Three times a day (TID) | ORAL | Status: DC | PRN
Start: 2020-08-04 — End: 2020-08-08

## 2020-08-04 MED ORDER — HEPARIN SOD (PORK) LOCK FLUSH 100 UNIT/ML IV SOLN: 500.0000 [IU] | Freq: Once | INTRAVENOUS | Status: DC | PRN

## 2020-08-04 MED ORDER — LORAZEPAM 0.5 MG PO TABS
0.5000 mg | ORAL_TABLET | Freq: Three times a day (TID) | ORAL | Status: DC | PRN
  Administered 2020-08-04 – 2020-08-07 (×4): 0.5 mg via ORAL
  Filled 2020-08-04 (×4): qty 1

## 2020-08-04 MED ORDER — VINCRISTINE SULFATE CHEMO INJECTION 1 MG/ML
Freq: Once | INTRAVENOUS | Status: AC
  Filled 2020-08-04: qty 10

## 2020-08-04 MED ORDER — PROCHLORPERAZINE MALEATE 10 MG PO TABS
10.0000 mg | ORAL_TABLET | Freq: Four times a day (QID) | ORAL | Status: DC | PRN
  Administered 2020-08-06: 10 mg via ORAL
  Filled 2020-08-04: qty 1

## 2020-08-04 MED ORDER — SODIUM CHLORIDE 0.9% FLUSH: 3.0000 mL | INTRAVENOUS | Status: DC | PRN

## 2020-08-04 MED ORDER — SODIUM CHLORIDE 0.9% FLUSH: 10.0000 mL | INTRAVENOUS | Status: DC | PRN

## 2020-08-04 MED ORDER — ACETAMINOPHEN 325 MG PO TABS
650.0000 mg | ORAL_TABLET | ORAL | Status: DC | PRN
  Administered 2020-08-07: 650 mg via ORAL
  Filled 2020-08-04: qty 2

## 2020-08-04 MED ORDER — ACETAMINOPHEN-CODEINE #3 300-30 MG PO TABS
1.0000 | ORAL_TABLET | ORAL | Status: DC | PRN
Start: 2020-08-04 — End: 2020-08-08
  Administered 2020-08-06 – 2020-08-07 (×2): 1 via ORAL
  Filled 2020-08-04 (×2): qty 1

## 2020-08-04 MED ORDER — COLD PACK MISC ONCOLOGY
1.0000 | Freq: Once | Status: DC | PRN
  Filled 2020-08-04: qty 1

## 2020-08-04 MED ORDER — SODIUM CHLORIDE 0.9 % IV SOLN
Freq: Once | INTRAVENOUS | Status: AC
  Administered 2020-08-04: 18 mg via INTRAVENOUS
  Filled 2020-08-04: qty 4

## 2020-08-04 MED ORDER — SODIUM CHLORIDE 0.9% FLUSH
10.0000 mL | Freq: Two times a day (BID) | INTRAVENOUS | Status: DC
  Administered 2020-08-04 – 2020-08-06 (×4): 10 mL
  Administered 2020-08-06: 20 mL
  Administered 2020-08-07 – 2020-08-08 (×3): 10 mL

## 2020-08-04 MED ORDER — SODIUM BICARBONATE/SODIUM CHLORIDE MOUTHWASH
1.0000 "application " | Freq: Four times a day (QID) | OROMUCOSAL | Status: DC
  Administered 2020-08-04 – 2020-08-08 (×12): 1 via OROMUCOSAL
  Filled 2020-08-04: qty 1000

## 2020-08-04 MED ORDER — SODIUM CHLORIDE 0.9 % IV SOLN: INTRAVENOUS | Status: DC

## 2020-08-04 MED ORDER — POLYETHYLENE GLYCOL 3350 17 G PO PACK
17.0000 g | PACK | Freq: Every day | ORAL | Status: DC
  Administered 2020-08-05 – 2020-08-08 (×4): 17 g via ORAL
  Filled 2020-08-04 (×4): qty 1

## 2020-08-04 MED ORDER — PREDNISONE 20 MG PO TABS
60.0000 mg | ORAL_TABLET | Freq: Every day | ORAL | Status: AC
  Administered 2020-08-04 – 2020-08-08 (×5): 60 mg via ORAL
  Filled 2020-08-04 (×5): qty 3

## 2020-08-04 MED ORDER — ALTEPLASE 2 MG IJ SOLR
2.0000 mg | Freq: Once | INTRAMUSCULAR | Status: DC | PRN
  Filled 2020-08-04: qty 2

## 2020-08-04 MED ORDER — CHLORHEXIDINE GLUCONATE CLOTH 2 % EX PADS
6.0000 | MEDICATED_PAD | Freq: Every day | CUTANEOUS | Status: DC
  Administered 2020-08-04 – 2020-08-08 (×5): 6 via TOPICAL

## 2020-08-04 MED ORDER — HEPARIN SOD (PORK) LOCK FLUSH 100 UNIT/ML IV SOLN: 250.0000 [IU] | Freq: Once | INTRAVENOUS | Status: DC | PRN

## 2020-08-04 MED ORDER — LIDOCAINE-PRILOCAINE 2.5-2.5 % EX CREA: 1.0000 "application " | TOPICAL_CREAM | CUTANEOUS | Status: DC | PRN

## 2020-08-04 MED ORDER — SENNOSIDES-DOCUSATE SODIUM 8.6-50 MG PO TABS
1.0000 | ORAL_TABLET | Freq: Two times a day (BID) | ORAL | Status: DC
  Administered 2020-08-04 – 2020-08-08 (×8): 1 via ORAL
  Filled 2020-08-04 (×8): qty 1

## 2020-08-04 NOTE — H&P (Addendum)
[place]  [phone] Fax:([phone] [place] H&P  Reason for Admission: Cycle #6 EPOCH-R  HPI: Bryan [name] is a wonderful 65 y.o. male who has been referred to [place] by Dr. Tammi Klippel for evaluation and management of lung mass.  About 6 months ago pt began having abdominal pain after eating. He saw several physicians who could not explain his symptoms.  He then developed extreme discomfort in his chest and red stools. He is unsure if the color of his stools was from blood or diet. Pt then began experiencing left arm pain and pleuritic chest pain, which triggered his Chest CTA in October.  CTA chest ruled out PE but showed bulky mediastinal and bilateral hilar adenopathy.  He was referred to hematology oncology at [facility] and a PET scan and biopsy were ordered.  There were significant delays in getting these tests completed.  Bryan [name] then establish care here in [place], [place] with hopes of expediting his work-up.  He underwent a PET scan on 04/08/2020 which showed intensely hypermetabolic bilateral neck, bilateral axillary, bilateral mediastinal, bilateral hilar, and retroperitoneal lymphadenopathy, numerous small hypermetabolic splenic masses.  He then had an ultrasound-guided biopsy of a left supraclavicular lymph node which showed diffuse large B-cell lymphoma.  MRI of the brain with and without contrast was performed on 04/18/2020 which showed no acute intracranial process and no evidence for metastatic disease.  It was recommended for the patient to begin systemic chemotherapy with EPOCH-R.   The patient is seen today for admission prior to cycle #6 of his chemotherapy.  His wife is at the bedside.  He denies fevers, chills, mucositis, chest pain, shortness of breath, cough, abdominal pain, nausea, vomiting, constipation.  Headaches have overall resolved.  The patient is seen today for admission for cycle 6 of chemotherapy.   Past  Medical History:  Diagnosis Date  . Prostate cancer [facility])     Past Surgical History:  Procedure Laterality Date  . CRYOTHERAPY    . IR IMAGING GUIDED PORT INSERTION  05/08/2020  . PROSTATE SURGERY         Family History  Problem Relation Age of Onset  . Bladder Cancer Mother   . Esophageal cancer Mother   . Prostate cancer Father   . Prostate cancer Paternal Uncle   Relation Age of Onset  . Bladder Cancer Mother   . Esophageal cancer Mother   . Prostate cancer Father   . Prostate cancer Paternal Uncle      Socioeconomic History  . Marital status: Married    Spouse name: 1  . Number of children: Not on file  . Years of education: Not on file  . Highest education level: Not on file  Occupational History  . Occupation: Futures trader: [employer]    Comment: medical leave  Tobacco Use  . Smoking status: Never Smoker  . Smokeless tobacco: Never Used  Vaping Use  . Vaping Use: Never used  Substance and Sexual Activity  . Alcohol use: Not Currently  . Drug use: Never  . Sexual activity: Yes  Other Topics Concern  . Not on file  Social History Narrative  . Not on file   Social Determinants of Health   Financial Resource Strain: Not on file  Food Insecurity: Not on file  Transportation Needs: Not on file  Physical Activity: Not on file  Stress: Not on file  Social Connections: Not on file  Intimate Partner Violence:  Not on file  :  Review of Systems: A comprehensive 14 point review of systems was negative except was noted in the HPI.  Exam: BP 98/72 (BP Location: Left Arm)   Pulse 69   Temp (!) 97.5 F (36.4 C) (Oral)   Resp 16   Ht 5\' 8"  (1.727 m)   Wt 62.8 kg   SpO2 98%   BMI 21.05 kg/m   General:  well-nourished in no acute distress.   Eyes:  no scleral icterus.   ENT:  There were no oropharyngeal lesions.   Neck was without thyromegaly.   Lymphatics:  Negative cervical, supraclavicular or axillary adenopathy.   Respiratory: lungs were  clear bilaterally without wheezing or crackles.   Cardiovascular:  Regular rate and rhythm, S1/S2, without murmur, rub or gallop.  There was no pedal edema.   GI:  abdomen was soft, flat, nontender, nondistended, without organomegaly.   Musculoskeletal:  no spinal tenderness of palpation of vertebral spine.   Skin exam was without echymosis, petichae.   Neuro exam was nonfocal. Patient was alert and oriented.  Attention was good.   Language was appropriate.  Mood was normal without depression.  Speech was not pressured.  Thought content was not tangential.    CBC    Component Value Date/Time   WBC 5.8 07/25/2020 1157   WBC 8.1 07/18/2020 0405   RBC 2.80 (L) 07/25/2020 1157   HGB 8.6 (L) 07/25/2020 1157   HCT 25.3 (L) 07/25/2020 1157   PLT 176 07/25/2020 1157   MCV 90.4 07/25/2020 1157   MCH 30.7 07/25/2020 1157   MCHC 34.0 07/25/2020 1157   RDW 15.6 (H) 07/25/2020 1157   LYMPHSABS 0.6 (L) 07/25/2020 1157   MONOABS 0.8 07/25/2020 1157   EOSABS 0.2 07/25/2020 1157   BASOSABS 0.1 07/25/2020 1157    NM PET Image Restag (PS) Skull Base To Thigh  Result Date: 05/28/2020 CLINICAL DATA:  Subsequent treatment strategy for diffuse large B-cell lymphoma. EXAM: NUCLEAR MEDICINE PET SKULL BASE TO THIGH TECHNIQUE: 7.35 mCi F-18 FDG was injected intravenously. Full-ring PET imaging was performed from the skull base to thigh after the radiotracer. CT data was obtained and used for attenuation correction and anatomic localization. Fasting blood glucose: 85 mg/dl COMPARISON:  04/08/2020 FINDINGS: Mediastinal blood pool activity: SUV max 1.66 Liver activity: SUV max 2.74 NECK: Marked interval decrease in the metabolism associated with lymph nodes in the neck. Level 2 lymph node on the RIGHT previously 0.9 cm now approximately 3 mm and without evidence of significant FDG uptake. Representative LEFT level 4 lymph nodes seen on the prior study previously with a maximum SUV of 22 shows mild enlargement on  today's study at 1 cm as compared to 1.8 cm (image 47, series 4) current maximum SUV of 1.7. RIGHT thoracic inlet lymph node measuring 2.2 cm on today's study shown to be necrotic with some peripheral activity on the prior exam previously at 3.1 cm. Mild uptake at the periphery with a maximum SUV of 4.2. Symmetric bilateral presumed physiologic parotid uptake Incidental CT findings: none CHEST: Bulky mediastinal and hilar lymph nodes seen on the previous exam have largely resolved. Thoracic inlet lymph node beneath the sternum (image 56, series 4) 9 mm short axis, previously 11 mm short axis maximum SUV of 2.0 as compared to 21 Subcarinal nodal tissue (image 78 of series 4) measuring 2 cm short axis, previously 2.8 cm short axis with a maximum SUV of 4.3 as compared to 22. RIGHT hilar lymph  node (image 82, series 4) 18 mm as compared to 22 mm SUV at 2.5 as compared to 25 Soft tissue along the aorta corresponding to LEFT infrahilar nodal tissue measuring 1 cm short axis dimension on image 89 series 4 previously 2.8 cm maximum SUV of 2.9 as compared to as much as 24 on the prior study Resolution of activity associated with a mildly enlarged LEFT axillary lymph node that was seen on prior study previously with activity up to a maximum SUV of 21. Subcentimeter lymph node LEFT axilla on image 61 of series 4 with a SUV of 1.2 on the current study Incidental CT findings: Scattered aortic atherosclerotic changes. RIGHT-sided Port-A-Cath terminates in the RIGHT atrium. Heart size is normal without pericardial effusion. Aortic caliber is. Central pulmonary vascular caliber normal. Limited assessment of cardiovascular structures given lack of intravenous contrast. Minimal basilar atelectasis. Airways are patent. ABDOMEN/PELVIS: Focal areas of uptake in the spleen resolved. Splenic activity remains slightly above background liver activity. Splenic size is stable, unenlarged. No hypermetabolic lymph nodes in the abdomen or  pelvis. Increased metabolism within lymph nodes in the upper abdomen seen on the previous study has resolved. There is decreased size of a hepatogastric lymph node, now measuring 1.4 cm as compared to 2.8 cm. (Image 113, series 4) max SUV of 2.5. Incidental CT findings: Liver, pancreas gallbladder, adrenal glands, kidneys stomach, small large bowel without acute process. Stool throughout the colon. Calcified atheromatous plaque in the abdominal aorta. SKELETON: Diffuse increased metabolism throughout marrow spaces of the spine and pelvis in particular. No focal uptake. Incidental CT findings: none IMPRESSION: 1. Marked interval response to therapy with resolution of nearly all areas of increased FDG uptake compared to the previous study. Most areas with residual uptake Deauville category 3. Subcarinal and RIGHT thoracic inlet with uptake slightly greater than liver, technically Deauville category 4 as described. Maximum SUV on today's study is 4.3 in the subcarinal region. 2. No new signs of disease. 3. Diffuse marrow activity presumably related to marrow stimulation. Suggest attention on follow-up. Aortic Atherosclerosis (ICD10-I70.0). Electronically Signed   By: Zetta Bills M.D.   On: 05/28/2020 16:42     NM PET Image Restag (PS) Skull Base To Thigh  Result Date: 05/28/2020 CLINICAL DATA:  Subsequent treatment strategy for diffuse large B-cell lymphoma. EXAM: NUCLEAR MEDICINE PET SKULL BASE TO THIGH TECHNIQUE: 7.35 mCi F-18 FDG was injected intravenously. Full-ring PET imaging was performed from the skull base to thigh after the radiotracer. CT data was obtained and used for attenuation correction and anatomic localization. Fasting blood glucose: 85 mg/dl COMPARISON:  04/08/2020 FINDINGS: Mediastinal blood pool activity: SUV max 1.66 Liver activity: SUV max 2.74 NECK: Marked interval decrease in the metabolism associated with lymph nodes in the neck. Level 2 lymph node on the RIGHT previously 0.9 cm now  approximately 3 mm and without evidence of significant FDG uptake. Representative LEFT level 4 lymph nodes seen on the prior study previously with a maximum SUV of 22 shows mild enlargement on today's study at 1 cm as compared to 1.8 cm (image 47, series 4) current maximum SUV of 1.7. RIGHT thoracic inlet lymph node measuring 2.2 cm on today's study shown to be necrotic with some peripheral activity on the prior exam previously at 3.1 cm. Mild uptake at the periphery with a maximum SUV of 4.2. Symmetric bilateral presumed physiologic parotid uptake Incidental CT findings: none CHEST: Bulky mediastinal and hilar lymph nodes seen on the previous exam have largely resolved. Thoracic  inlet lymph node beneath the sternum (image 56, series 4) 9 mm short axis, previously 11 mm short axis maximum SUV of 2.0 as compared to 21 Subcarinal nodal tissue (image 78 of series 4) measuring 2 cm short axis, previously 2.8 cm short axis with a maximum SUV of 4.3 as compared to 22. RIGHT hilar lymph node (image 82, series 4) 18 mm as compared to 22 mm SUV at 2.5 as compared to 25 Soft tissue along the aorta corresponding to LEFT infrahilar nodal tissue measuring 1 cm short axis dimension on image 89 series 4 previously 2.8 cm maximum SUV of 2.9 as compared to as much as 24 on the prior study Resolution of activity associated with a mildly enlarged LEFT axillary lymph node that was seen on prior study previously with activity up to a maximum SUV of 21. Subcentimeter lymph node LEFT axilla on image 61 of series 4 with a SUV of 1.2 on the current study Incidental CT findings: Scattered aortic atherosclerotic changes. RIGHT-sided Port-A-Cath terminates in the RIGHT atrium. Heart size is normal without pericardial effusion. Aortic caliber is. Central pulmonary vascular caliber normal. Limited assessment of cardiovascular structures given lack of intravenous contrast. Minimal basilar atelectasis. Airways are patent. ABDOMEN/PELVIS: Focal  areas of uptake in the spleen resolved. Splenic activity remains slightly above background liver activity. Splenic size is stable, unenlarged. No hypermetabolic lymph nodes in the abdomen or pelvis. Increased metabolism within lymph nodes in the upper abdomen seen on the previous study has resolved. There is decreased size of a hepatogastric lymph node, now measuring 1.4 cm as compared to 2.8 cm. (Image 113, series 4) max SUV of 2.5. Incidental CT findings: Liver, pancreas gallbladder, adrenal glands, kidneys stomach, small large bowel without acute process. Stool throughout the colon. Calcified atheromatous plaque in the abdominal aorta. SKELETON: Diffuse increased metabolism throughout marrow spaces of the spine and pelvis in particular. No focal uptake. Incidental CT findings: none IMPRESSION: 1. Marked interval response to therapy with resolution of nearly all areas of increased FDG uptake compared to the previous study. Most areas with residual uptake Deauville category 3. Subcarinal and RIGHT thoracic inlet with uptake slightly greater than liver, technically Deauville category 4 as described. Maximum SUV on today's study is 4.3 in the subcarinal region. 2. No new signs of disease. 3. Diffuse marrow activity presumably related to marrow stimulation. Suggest attention on follow-up. Aortic Atherosclerosis (ICD10-I70.0). Electronically Signed   By: Zetta Bills M.D.   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 Intensely hypermetabolic bilateral neck, bilateral axillary, bilateral mediastinal, bilateral hilar and retroperitoneal lymphadenopathy. Numerous small hypermetabolic splenic masses. Findings are compatible with malignancy, favoring lymphoproliferative disorder."  -Admit to inpatient oncology unit for cycle 6 of EPOCH-R.   -We will obtain a baseline CBC with  differential andCMET.  Anticipate chemotherapy will begin once labs have been reviewed. -No plans for intrathecal methotrexate per patient preference. -SCDs ordered.  The patient has declined prophylactic Lovenox. -As needed antiemetics and bowel regimen have been ordered. -Continue home pain medication. -The patient will have Rituxan at the cancer on 08/11/2020.  He will administer G-CSF as an outpatient the day after his chemotherapy completes.  Labs and follow-up visit are scheduled for 08/18/2020.  Mikey Bussing, DNP, AGPCNP-BC, AOCNP   ADDENDUM  .Patient was Personally and independently interviewed, examined and relevant elements of the history of present  illness were reviewed in details and an assessment and plan was created. All elements of the patient's history of present illness , assessment and plan were discussed in details with Mikey Bussing, DNP, AGPCNP-BC, AOCNP. The above documentation reflects our combined findings assessment and plan.  Patient has bounced back well after his fifth cycle of EPOCH-R and has no significant residual toxicities.  Headaches have resolved.  Per patient's wish we are holding off on further intrathecal methotrexate for CNS prophylaxis. Labs are stable and the patient will continue same dose of inpatient [facility] Will be getting Rituxan as outpatient and his wife will be administering his G-CSF at home.  Sullivan Lone MD MS

## 2020-08-05 DIAGNOSIS — C8338 Diffuse large B-cell lymphoma, lymph nodes of multiple sites: Secondary | ICD-10-CM

## 2020-08-05 DIAGNOSIS — Z5111 Encounter for antineoplastic chemotherapy: Secondary | ICD-10-CM | POA: Diagnosis not present

## 2020-08-05 LAB — CBC WITH DIFFERENTIAL/PLATELET
Abs Immature Granulocytes: 2.19 10*3/uL — ABNORMAL HIGH (ref 0.00–0.07)
Basophils Absolute: 0.1 10*3/uL (ref 0.0–0.1)
Basophils Relative: 0 %
Eosinophils Absolute: 0 10*3/uL (ref 0.0–0.5)
Eosinophils Relative: 0 %
HCT: 28.9 % — ABNORMAL LOW (ref 39.0–52.0)
Hemoglobin: 9.3 g/dL — ABNORMAL LOW (ref 13.0–17.0)
Immature Granulocytes: 10 %
Lymphocytes Relative: 4 %
Lymphs Abs: 0.9 10*3/uL (ref 0.7–4.0)
MCH: 30.8 pg (ref 26.0–34.0)
MCHC: 32.2 g/dL (ref 30.0–36.0)
MCV: 95.7 fL (ref 80.0–100.0)
Monocytes Absolute: 0.4 10*3/uL (ref 0.1–1.0)
Monocytes Relative: 2 %
Neutro Abs: 18.8 10*3/uL — ABNORMAL HIGH (ref 1.7–7.7)
Neutrophils Relative %: 84 %
Platelets: 329 10*3/uL (ref 150–400)
RBC: 3.02 MIL/uL — ABNORMAL LOW (ref 4.22–5.81)
RDW: 16.7 % — ABNORMAL HIGH (ref 11.5–15.5)
WBC: 22.4 10*3/uL — ABNORMAL HIGH (ref 4.0–10.5)
nRBC: 0 % (ref 0.0–0.2)

## 2020-08-05 LAB — COMPREHENSIVE METABOLIC PANEL
ALT: 25 U/L (ref 0–44)
AST: 22 U/L (ref 15–41)
Albumin: 3.5 g/dL (ref 3.5–5.0)
Alkaline Phosphatase: 78 U/L (ref 38–126)
Anion gap: 7 (ref 5–15)
BUN: 15 mg/dL (ref 8–23)
CO2: 23 mmol/L (ref 22–32)
Calcium: 8.8 mg/dL — ABNORMAL LOW (ref 8.9–10.3)
Chloride: 109 mmol/L (ref 98–111)
Creatinine, Ser: 0.66 mg/dL (ref 0.61–1.24)
GFR, Estimated: >60 mL/min (ref >60)
Glucose, Bld: 119 mg/dL — ABNORMAL HIGH (ref 70–99)
Potassium: 4.3 mmol/L (ref 3.5–5.1)
Sodium: 139 mmol/L (ref 135–145)
Total Bilirubin: 0.4 mg/dL (ref 0.3–1.2)
Total Protein: 5.8 g/dL — ABNORMAL LOW (ref 6.5–8.1)

## 2020-08-05 MED ORDER — VINCRISTINE SULFATE CHEMO INJECTION 1 MG/ML
Freq: Once | INTRAVENOUS | Status: AC
  Filled 2020-08-05: qty 10

## 2020-08-05 MED ORDER — SODIUM CHLORIDE 0.9 % IV SOLN
Freq: Once | INTRAVENOUS | Status: AC
  Administered 2020-08-05: 18 mg via INTRAVENOUS
  Filled 2020-08-05: qty 4

## 2020-08-05 NOTE — Progress Notes (Addendum)
HEMATOLOGY-ONCOLOGY PROGRESS NOTE  SUBJECTIVE: Mr. Mofield tolerated day 1 of cycle 6 of his chemotherapy well overall.  He denies headaches, fevers, chills, chest pain, shortness of breath, abdominal pain, nausea, vomiting.  Bowels are moving with current bowel regimen.  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[place])     REVIEW OF SYSTEMS:   Constitutional: Denies fevers, chills or abnormal weight loss Eyes: Denies blurriness of vision Ears, nose, mouth, throat, and face: Denies mucositis or sore throat Respiratory: Denies cough, dyspnea or wheezes Cardiovascular: Denies palpitation, chest discomfort Gastrointestinal:  Denies nausea, heartburn or change in bowel habits Skin: Denies abnormal skin rashes Lymphatics: Denies new lymphadenopathy or easy bruising Neurological:Denies numbness, tingling or new weaknesses Behavioral/Psych: Mood is stable, no new changes  Extremities: No lower extremity edema All other systems were reviewed with the patient and are negative.  I have reviewed the past medical history, past surgical history, social history and family history with the patient and they are unchanged from previous note.   PHYSICAL EXAMINATION: ECOG PERFORMANCE STATUS: 1 - Symptomatic but completely ambulatory  Vitals:   08/04/20 2236 08/05/20 0622  BP: (!) 91/56 102/65  Pulse: 60 72  Resp: 17 17  Temp: (!) 97.4 F (36.3 C) (!) 97.5 F (36.4 C)  SpO2: 98% 94%   Filed Weights   08/04/20 1038   Weight: 62.8 kg    Intake/Output from previous day: 02/28 0701 - 03/01 0700 In: 1013.9 [P.O.:240; I.V.:184.3; IV Piggyback:589.7] Out: -   GENERAL:alert, no distress and comfortable SKIN: skin color, texture, turgor are normal, no rashes or significant lesions EYES: normal, Conjunctiva are pink and non-injected, sclera clear OROPHARYNX:no exudate, no erythema and lips, buccal mucosa, and tongue normal  LUNGS: clear to auscultation and percussion with normal breathing effort HEART: regular rate & rhythm and no murmurs and no lower extremity edema ABDOMEN:abdomen soft, non-tender and normal bowel sounds Musculoskeletal:no cyanosis of digits and no clubbing  NEURO: alert & oriented x 3 with fluent speech, no focal motor/sensory deficits  LABORATORY DATA:  I have reviewed the data as listed CMP Latest Ref Rng & Units 08/05/2020 08/04/2020 07/25/2020  Glucose 70 - 99 mg/dL 119(H) 110(H) 110(H)  BUN 8 - 23 mg/dL 15 10 7(L)  Creatinine 0.61 - 1.24 mg/dL 0.66 0.66 0.72  Sodium 135 - 145 mmol/L 139 138 134(L)  Potassium 3.5 - 5.1 mmol/L 4.3 4.3 4.0  Chloride 98 - 111 mmol/L 109 105 102  CO2 22 - 32 mmol/L 23 22 27   Calcium 8.9 - 10.3 mg/dL 8.8(L) 9.2 8.4(L)  Total Protein 6.5 - 8.1 g/dL 5.8(L) 6.3(L) 5.7(L)  Total Bilirubin 0.3 - 1.2 mg/dL 0.4 0.5 0.3  Alkaline Phos 38 - 126 U/L 78 88 100  AST 15 - 41 U/L 22 23 22   ALT 0 - 44 U/L 25 27 44    Lab Results  Component Value Date   WBC 22.4 (H) 08/05/2020   HGB 9.3 (L) 08/05/2020   HCT 28.9 (L)  08/05/2020   MCV 95.7 08/05/2020   PLT 329 08/05/2020   NEUTROABS 18.8 (H) 08/05/2020    No results found.  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 Intensely hypermetabolic bilateral neck, bilateral axillary, bilateral mediastinal, bilateral hilar and retroperitoneal lymphadenopathy. Numerous small hypermetabolic splenic masses.  Findings are compatible with malignancy, favoring lymphoproliferative disorder."  -Labs reviewed and are adequate to continue with treatment.  He will proceed with day 2 of cycle 6 of his chemotherapy today as planned.  -Continue to check daily CBC with differential and CMET. -We will hold on intrathecal chemo this cycle per patient preference. -He has SCDs for DVT prophylaxis.  He is ambulating well.  Patient declined prophylactic Lovenox. -As needed antiemetics and bowel regimen have been ordered. -The patient will have Rituxan at the cancer on 08/11/2020.  He will administer G-CSF as an outpatient the day after his chemotherapy completes.  Labs and follow-up visit are scheduled for 08/18/2020.   LOS: 1 day   Mikey Bussing, DNP, AGPCNP-BC, AOCNP 08/05/20  ADDENDUM  .Patient was Personally and independently interviewed, examined and relevant elements of the history of present illness were reviewed in details and an assessment and plan was created. All elements of the patient's history of present illness , assessment and plan were discussed in details with Mikey Bussing, DNP, AGPCNP-BC, AOCNP. The above documentation reflects our combined findings assessment and plan.  In good spirits. Labs stable. Continue current plan to complete C6 of EPOCH-R  Sullivan Lone MD MS

## 2020-08-06 DIAGNOSIS — C8338 Diffuse large B-cell lymphoma, lymph nodes of multiple sites: Secondary | ICD-10-CM | POA: Diagnosis not present

## 2020-08-06 DIAGNOSIS — Z5111 Encounter for antineoplastic chemotherapy: Secondary | ICD-10-CM | POA: Diagnosis not present

## 2020-08-06 LAB — CBC WITH DIFFERENTIAL/PLATELET
Abs Immature Granulocytes: 1.35 10*3/uL — ABNORMAL HIGH (ref 0.00–0.07)
Basophils Absolute: 0.1 10*3/uL (ref 0.0–0.1)
Basophils Relative: 0 %
Eosinophils Absolute: 0 10*3/uL (ref 0.0–0.5)
Eosinophils Relative: 0 %
HCT: 27.6 % — ABNORMAL LOW (ref 39.0–52.0)
Hemoglobin: 9 g/dL — ABNORMAL LOW (ref 13.0–17.0)
Immature Granulocytes: 6 %
Lymphocytes Relative: 4 %
Lymphs Abs: 0.8 10*3/uL (ref 0.7–4.0)
MCH: 31 pg (ref 26.0–34.0)
MCHC: 32.6 g/dL (ref 30.0–36.0)
MCV: 95.2 fL (ref 80.0–100.0)
Monocytes Absolute: 1.1 10*3/uL — ABNORMAL HIGH (ref 0.1–1.0)
Monocytes Relative: 5 %
Neutro Abs: 19.2 10*3/uL — ABNORMAL HIGH (ref 1.7–7.7)
Neutrophils Relative %: 85 %
Platelets: 317 10*3/uL (ref 150–400)
RBC: 2.9 MIL/uL — ABNORMAL LOW (ref 4.22–5.81)
RDW: 16.9 % — ABNORMAL HIGH (ref 11.5–15.5)
WBC: 22.6 10*3/uL — ABNORMAL HIGH (ref 4.0–10.5)
nRBC: 0 % (ref 0.0–0.2)

## 2020-08-06 LAB — COMPREHENSIVE METABOLIC PANEL
ALT: 29 U/L (ref 0–44)
AST: 22 U/L (ref 15–41)
Albumin: 3.3 g/dL — ABNORMAL LOW (ref 3.5–5.0)
Alkaline Phosphatase: 71 U/L (ref 38–126)
Anion gap: 8 (ref 5–15)
BUN: 15 mg/dL (ref 8–23)
CO2: 22 mmol/L (ref 22–32)
Calcium: 8.7 mg/dL — ABNORMAL LOW (ref 8.9–10.3)
Chloride: 107 mmol/L (ref 98–111)
Creatinine, Ser: 0.64 mg/dL (ref 0.61–1.24)
GFR, Estimated: >60 mL/min (ref >60)
Glucose, Bld: 102 mg/dL — ABNORMAL HIGH (ref 70–99)
Potassium: 3.5 mmol/L (ref 3.5–5.1)
Sodium: 137 mmol/L (ref 135–145)
Total Bilirubin: 0.5 mg/dL (ref 0.3–1.2)
Total Protein: 5.5 g/dL — ABNORMAL LOW (ref 6.5–8.1)

## 2020-08-06 MED ORDER — SODIUM CHLORIDE 0.9 % IV SOLN
Freq: Once | INTRAVENOUS | Status: AC
  Administered 2020-08-06: 8 mg via INTRAVENOUS
  Filled 2020-08-06: qty 4

## 2020-08-06 MED ORDER — VINCRISTINE SULFATE CHEMO INJECTION 1 MG/ML
Freq: Once | INTRAVENOUS | Status: AC
  Filled 2020-08-06: qty 10

## 2020-08-07 DIAGNOSIS — Z5111 Encounter for antineoplastic chemotherapy: Secondary | ICD-10-CM | POA: Diagnosis not present

## 2020-08-07 DIAGNOSIS — C8338 Diffuse large B-cell lymphoma, lymph nodes of multiple sites: Secondary | ICD-10-CM | POA: Diagnosis not present

## 2020-08-07 LAB — CBC WITH DIFFERENTIAL/PLATELET
Abs Immature Granulocytes: 0.23 10*3/uL — ABNORMAL HIGH (ref 0.00–0.07)
Basophils Absolute: 0 10*3/uL (ref 0.0–0.1)
Basophils Relative: 0 %
Eosinophils Absolute: 0 10*3/uL (ref 0.0–0.5)
Eosinophils Relative: 0 %
HCT: 27.3 % — ABNORMAL LOW (ref 39.0–52.0)
Hemoglobin: 9.1 g/dL — ABNORMAL LOW (ref 13.0–17.0)
Immature Granulocytes: 2 %
Lymphocytes Relative: 6 %
Lymphs Abs: 0.7 10*3/uL (ref 0.7–4.0)
MCH: 31.6 pg (ref 26.0–34.0)
MCHC: 33.3 g/dL (ref 30.0–36.0)
MCV: 94.8 fL (ref 80.0–100.0)
Monocytes Absolute: 0.5 10*3/uL (ref 0.1–1.0)
Monocytes Relative: 5 %
Neutro Abs: 10.3 10*3/uL — ABNORMAL HIGH (ref 1.7–7.7)
Neutrophils Relative %: 87 %
Platelets: 342 10*3/uL (ref 150–400)
RBC: 2.88 MIL/uL — ABNORMAL LOW (ref 4.22–5.81)
RDW: 16.4 % — ABNORMAL HIGH (ref 11.5–15.5)
WBC: 11.8 10*3/uL — ABNORMAL HIGH (ref 4.0–10.5)
nRBC: 0 % (ref 0.0–0.2)

## 2020-08-07 LAB — COMPREHENSIVE METABOLIC PANEL
ALT: 32 U/L (ref 0–44)
AST: 23 U/L (ref 15–41)
Albumin: 3.3 g/dL — ABNORMAL LOW (ref 3.5–5.0)
Alkaline Phosphatase: 64 U/L (ref 38–126)
Anion gap: 5 (ref 5–15)
BUN: 16 mg/dL (ref 8–23)
CO2: 27 mmol/L (ref 22–32)
Calcium: 8.6 mg/dL — ABNORMAL LOW (ref 8.9–10.3)
Chloride: 107 mmol/L (ref 98–111)
Creatinine, Ser: 0.63 mg/dL (ref 0.61–1.24)
GFR, Estimated: >60 mL/min (ref >60)
Glucose, Bld: 95 mg/dL (ref 70–99)
Potassium: 3.7 mmol/L (ref 3.5–5.1)
Sodium: 139 mmol/L (ref 135–145)
Total Bilirubin: 0.5 mg/dL (ref 0.3–1.2)
Total Protein: 5.3 g/dL — ABNORMAL LOW (ref 6.5–8.1)

## 2020-08-07 MED ORDER — SODIUM CHLORIDE 0.9 % IV SOLN
750.0000 mg/m2 | Freq: Once | INTRAVENOUS | Status: AC
  Administered 2020-08-08: 1300 mg via INTRAVENOUS
  Filled 2020-08-07: qty 65

## 2020-08-07 MED ORDER — SODIUM CHLORIDE 0.9 % IV SOLN
Freq: Once | INTRAVENOUS | Status: AC
  Administered 2020-08-08: 36 mg via INTRAVENOUS
  Filled 2020-08-07: qty 8

## 2020-08-07 MED ORDER — SODIUM CHLORIDE 0.9 % IV SOLN
Freq: Once | INTRAVENOUS | Status: AC
  Administered 2020-08-07: 18 mg via INTRAVENOUS
  Filled 2020-08-07: qty 4

## 2020-08-07 MED ORDER — ACETAMINOPHEN-CODEINE 300-30 MG PO TABS: 1.0000 | ORAL_TABLET | Freq: Four times a day (QID) | ORAL | 0 refills | Status: AC | PRN

## 2020-08-07 MED ORDER — LORAZEPAM 0.5 MG PO TABS: 0.5000 mg | ORAL_TABLET | Freq: Three times a day (TID) | ORAL | 0 refills | Status: AC | PRN

## 2020-08-07 MED ORDER — VINCRISTINE SULFATE CHEMO INJECTION 1 MG/ML
Freq: Once | INTRAVENOUS | Status: AC
  Filled 2020-08-07: qty 10

## 2020-08-07 NOTE — Progress Notes (Signed)
HEMATOLOGY-ONCOLOGY PROGRESS NOTE  SUBJECTIVE: Mr. Carns tolerated day 2 of cycle 6 of his chemotherapy well overall and is tolerating day 3 of treatment well today.  He notes no acute issues.  Some mild grade 1 nausea and fatigue.  No vomiting. No headaches.  Has been sleeping well with the lorazepam.  Feels a little bit blah with the high-dose steroids. Looking forward to finishing treatment soon.  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   I have reviewed the past medical history, past surgical history, social history and family history with the patient and they are unchanged from previous note.   PHYSICAL EXAMINATION: ECOG PERFORMANCE STATUS: 1 - Symptomatic but completely ambulatory Vital signs reviewed . GENERAL:alert, in no acute distress and comfortable SKIN: no acute rashes, no significant lesions EYES: conjunctiva are pink and non-injected, sclera anicteric OROPHARYNX: MMM, no exudates, no oropharyngeal erythema or ulceration NECK: supple, no JVD LYMPH:  no palpable lymphadenopathy in the cervical, axillary or inguinal regions LUNGS: clear to auscultation b/l with normal respiratory effort HEART: regular rate & rhythm ABDOMEN:  normoactive bowel sounds , non tender, not distended. Extremity: no pedal edema PSYCH: alert & oriented x 3 with fluent speech NEURO: no focal motor/sensory  deficits    LABORATORY DATA:  I have reviewed the data as listed CMP Latest Ref Rng & Units 08/06/2020 08/05/2020  Glucose 70 - 99 mg/dL 102(H) 119(H)  BUN 8 - 23 mg/dL 15 15  Creatinine 0.61 - 1.24 mg/dL 0.64 0.66  Sodium 135 - 145 mmol/L 137 139  Potassium 3.5 - 5.1 mmol/L 3.5 4.3  Chloride 98 - 111 mmol/L 107 109  CO2 22 - 32 mmol/L 22 23  Calcium 8.9 - 10.3 mg/dL 8.7(L) 8.8(L)  Total Protein 6.5 - 8.1 g/dL 5.5(L) 5.8(L)  Total Bilirubin 0.3 - 1.2 mg/dL 0.5 0.4  Alkaline Phos 38 - 126 U/L 71 78  AST 15 - 41 U/L 22 22  ALT 0 - 44 U/L 29 25   . CBC Latest Ref Rng & Units 08/06/2020 08/05/2020  WBC 4.0 - 10.5 K/uL 22.6(H) 22.4(H)  Hemoglobin 13.0 - 17.0 g/dL 9.0(L) 9.3(L)  Hematocrit 39.0 - 52.0 % 27.6(L) 28.9(L)  Platelets 150 - 400 K/uL 317 329    No results found.  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 Intensely hypermetabolic bilateral neck, bilateral axillary, bilateral mediastinal, bilateral hilar and retroperitoneal lymphadenopathy. Numerous small hypermetabolic splenic masses. Findings are compatible with malignancy, favoring lymphoproliferative disorder." PLAN -Labs reviewed/stable and patient has no new prohibitive toxicities. -We will continue his current [facility] treatment as per orders. -Continue to check daily CBC with differential and CMET. -We will hold on intrathecal chemo this cycle per patient preference. -He has SCDs for DVT prophylaxis.  He is ambulating  well.  Patient declined prophylactic Lovenox. -As needed antiemetics and bowel regimen have been ordered. -The patient will have Rituxan at the cancer on 08/11/2020.  He will administer G-CSF as an outpatient the day after his chemotherapy completes.  Labs and follow-up visit are scheduled for 08/18/2020.   Sullivan Lone MD MS

## 2020-08-07 NOTE — Progress Notes (Addendum)
HEMATOLOGY-ONCOLOGY PROGRESS NOTE  SUBJECTIVE: Bryan [name] tolerated day 3 of cycle 6 of his chemotherapy well overall.  He reports some mild nausea today but no vomiting.  He reports that bowels are moving adequately.  He denies headaches, fevers, chills, chest pain, shortness of breath, abdominal pain.  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 ([place])     REVIEW OF SYSTEMS:   Constitutional: Denies fevers, chills or abnormal weight loss Eyes: Denies blurriness of vision Ears, nose, mouth, throat, and face: Denies mucositis or sore throat Respiratory: Denies cough, dyspnea or wheezes Cardiovascular: Denies palpitation, chest discomfort Gastrointestinal: Reports mild nausea but no vomiting Skin: Denies abnormal skin rashes Lymphatics: Denies new lymphadenopathy or easy bruising Neurological:Denies numbness, tingling or new weaknesses Behavioral/Psych: Mood is stable, no new changes  Extremities: No lower extremity edema All other systems were reviewed with the patient and are negative.  I have reviewed the past medical history, past surgical history, social history and family history with the patient and they are unchanged from previous note.   PHYSICAL EXAMINATION: ECOG PERFORMANCE STATUS: 1 - Symptomatic but completely ambulatory  Vitals:   08/06/20 1500 08/07/20 0521  BP: 117/75 119/75  Pulse: 65 (!) 58  Resp: 16 18  Temp: 97.8 F (36.6 C) (!) 97.5 F (36.4 C)  SpO2: 94% (!) 89%   Filed  Weights   08/04/20 1038  Weight: 62.8 kg    Intake/Output from previous day: 03/02 0701 - 03/03 0700 In: 840 [P.O.:840] Out: -   GENERAL:alert, no distress and comfortable SKIN: skin color, texture, turgor are normal, no rashes or significant lesions EYES: normal, Conjunctiva are pink and non-injected, sclera clear OROPHARYNX:no exudate, no erythema and lips, buccal mucosa, and tongue normal  LUNGS: clear to auscultation and percussion with normal breathing effort HEART: regular rate & rhythm and no murmurs and no lower extremity edema ABDOMEN:abdomen soft, non-tender and normal bowel sounds Musculoskeletal:no cyanosis of digits and no clubbing  NEURO: alert & oriented x 3 with fluent speech, no focal motor/sensory deficits  LABORATORY DATA:  I have reviewed the data as listed CMP Latest Ref Rng & Units 08/07/2020 08/06/2020 08/05/2020  Glucose 70 - 99 mg/dL 95 102(H) 119(H)  BUN 8 - 23 mg/dL 16 15 15   Creatinine 0.61 - 1.24 mg/dL 0.63 0.64 0.66  Sodium 135 - 145 mmol/L 139 137 139  Potassium 3.5 - 5.1 mmol/L 3.7 3.5 4.3  Chloride 98 - 111 mmol/L 107 107 109  CO2 22 - 32 mmol/L 27 22 23   Calcium 8.9 - 10.3 mg/dL 8.6(L) 8.7(L) 8.8(L)  Total Protein 6.5 - 8.1 g/dL 5.3(L) 5.5(L) 5.8(L)  Total Bilirubin 0.3 - 1.2 mg/dL 0.5 0.5 0.4  Alkaline Phos 38 - 126 U/L 64 71 78  AST 15 - 41 U/L 23 22 22   ALT 0 - 44 U/L 32 29 25    Lab Results  Component Value Date   WBC 11.8 (H) 08/07/2020   HGB 9.1 (L) 08/07/2020   HCT  27.3 (L) 08/07/2020   MCV 94.8 08/07/2020   PLT 342 08/07/2020   NEUTROABS 10.3 (H) 08/07/2020    No results found.  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 Intensely hypermetabolic bilateral neck, bilateral axillary, bilateral mediastinal, bilateral hilar and retroperitoneal lymphadenopathy. Numerous small hypermetabolic splenic masses. Findings are  compatible with malignancy, favoring lymphoproliferative disorder."  -Labs reviewed and are adequate to continue with treatment.  He will proceed with day 4 of cycle 6 of his chemotherapy today as planned.  -Continue to check daily CBC with differential and CMET. -We will hold on intrathecal chemo this cycle per patient preference. -He has SCDs for DVT prophylaxis.  He is ambulating well.  Patient declined prophylactic Lovenox. -As needed antiemetics and bowel regimen have been ordered. -The patient will have Rituxan at the cancer on 08/11/2020.  He will administer G-CSF as an outpatient the day after his chemotherapy completes.  Labs and follow-up visit are scheduled for 08/18/2020.   LOS: 3 days   Mikey Bussing, DNP, AGPCNP-BC, AOCNP 08/07/20  ADDENDUM  .Patient was Personally and independently interviewed, examined and relevant elements of the history of present illness were reviewed in details and an assessment and plan was created. All elements of the patient's history of present illness , assessment and plan were discussed in details withKristin Curcio, DNP, AGPCNP-BC, AOCNP. The above documentation reflects our combined findings assessment and plan.  [place] Lone MD MS

## 2020-08-08 ENCOUNTER — Encounter: Payer: Self-pay | Admitting: Hematology

## 2020-08-08 ENCOUNTER — Other Ambulatory Visit: Payer: Self-pay | Admitting: Hematology

## 2020-08-08 DIAGNOSIS — C8338 Diffuse large B-cell lymphoma, lymph nodes of multiple sites: Secondary | ICD-10-CM | POA: Diagnosis not present

## 2020-08-08 DIAGNOSIS — Z5111 Encounter for antineoplastic chemotherapy: Secondary | ICD-10-CM | POA: Diagnosis not present

## 2020-08-08 LAB — CBC WITH DIFFERENTIAL/PLATELET
Abs Immature Granulocytes: 0.07 10*3/uL (ref 0.00–0.07)
Basophils Absolute: 0 10*3/uL (ref 0.0–0.1)
Basophils Relative: 0 %
Eosinophils Absolute: 0 10*3/uL (ref 0.0–0.5)
Eosinophils Relative: 0 %
HCT: 27 % — ABNORMAL LOW (ref 39.0–52.0)
Hemoglobin: 9.1 g/dL — ABNORMAL LOW (ref 13.0–17.0)
Immature Granulocytes: 1 %
Lymphocytes Relative: 14 %
Lymphs Abs: 0.8 10*3/uL (ref 0.7–4.0)
MCH: 31.3 pg (ref 26.0–34.0)
MCHC: 33.7 g/dL (ref 30.0–36.0)
MCV: 92.8 fL (ref 80.0–100.0)
Monocytes Absolute: 0.2 10*3/uL (ref 0.1–1.0)
Monocytes Relative: 4 %
Neutro Abs: 4.4 10*3/uL (ref 1.7–7.7)
Neutrophils Relative %: 81 %
Platelets: 291 10*3/uL (ref 150–400)
RBC: 2.91 MIL/uL — ABNORMAL LOW (ref 4.22–5.81)
RDW: 15.7 % — ABNORMAL HIGH (ref 11.5–15.5)
WBC: 5.4 10*3/uL (ref 4.0–10.5)
nRBC: 0 % (ref 0.0–0.2)

## 2020-08-08 LAB — COMPREHENSIVE METABOLIC PANEL
ALT: 31 U/L (ref 0–44)
AST: 18 U/L (ref 15–41)
Albumin: 3.1 g/dL — ABNORMAL LOW (ref 3.5–5.0)
Alkaline Phosphatase: 58 U/L (ref 38–126)
Anion gap: 8 (ref 5–15)
BUN: 15 mg/dL (ref 8–23)
CO2: 23 mmol/L (ref 22–32)
Calcium: 8.7 mg/dL — ABNORMAL LOW (ref 8.9–10.3)
Chloride: 106 mmol/L (ref 98–111)
Creatinine, Ser: 0.62 mg/dL (ref 0.61–1.24)
GFR, Estimated: >60 mL/min (ref >60)
Glucose, Bld: 92 mg/dL (ref 70–99)
Potassium: 3.4 mmol/L — ABNORMAL LOW (ref 3.5–5.1)
Sodium: 137 mmol/L (ref 135–145)
Total Bilirubin: 0.9 mg/dL (ref 0.3–1.2)
Total Protein: 5.1 g/dL — ABNORMAL LOW (ref 6.5–8.1)

## 2020-08-08 MED ORDER — POTASSIUM CHLORIDE CRYS ER 20 MEQ PO TBCR
20.0000 meq | EXTENDED_RELEASE_TABLET | Freq: Once | ORAL | Status: AC
  Administered 2020-08-08: 20 meq via ORAL
  Filled 2020-08-08: qty 1

## 2020-08-08 MED ORDER — HEPARIN SOD (PORK) LOCK FLUSH 100 UNIT/ML IV SOLN
500.0000 [IU] | Freq: Once | INTRAVENOUS | Status: AC
  Administered 2020-08-08: 500 [IU] via INTRAVENOUS
  Filled 2020-08-08: qty 5

## 2020-08-08 NOTE — Discharge Summary (Addendum)
Discharge Summary  Patient ID: Bryan [name] MRN: [id number] DOB/AGE: 11-08-1955 65 y.o.  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 [facility])  Discharged Condition: good  Discharge Labs:    CBC    Component Value Date/Time   WBC 5.4 08/08/2020 0616   RBC 2.91 (L) 08/08/2020 0616   HGB 9.1 (L) 08/08/2020 0616   HGB 8.6 (L) 07/25/2020 1157   HCT 27.0 (L) 08/08/2020 0616   PLT 291 08/08/2020 0616   PLT 176 07/25/2020 1157   MCV 92.8 08/08/2020 0616   MCH 31.3 08/08/2020 0616   MCHC 33.7 08/08/2020 0616   RDW 15.7 (H) 08/08/2020 0616   LYMPHSABS 0.8 08/08/2020 0616   MONOABS 0.2 08/08/2020 0616   EOSABS 0.0 08/08/2020 0616   BASOSABS 0.0 08/08/2020 0616   CMP Latest Ref Rng & Units 08/08/2020 08/07/2020 08/06/2020  Glucose 70 - 99 mg/dL 92 95 102(H)  BUN 8 - 23 mg/dL 15 16 15   Creatinine 0.61 - 1.24 mg/dL 0.62 0.63 0.64  Sodium 135 - 145 mmol/L 137 139 137  Potassium 3.5 - 5.1 mmol/L 3.4(L) 3.7 3.5  Chloride 98 - 111 mmol/L 106 107 107  CO2 22 - 32 mmol/L 23 27 22   Calcium 8.9 - 10.3 mg/dL 8.7(L) 8.6(L) 8.7(L)  Total Protein 6.5 - 8.1 g/dL 5.1(L) 5.3(L) 5.5(L)  Total Bilirubin 0.3 - 1.2 mg/dL 0.9 0.5 0.5  Alkaline Phos 38 - 126 U/L 58 64 71  AST 15 - 41 U/L 18 23 22   ALT 0 - 44 U/L 31 32 29    Consults: None  Procedures: None  Disposition:  Discharge disposition: 01-Home or Self Care      Allergies as of 08/08/2020   No Known Allergies     Medication List    TAKE these medications   acetaminophen 325 MG tablet Commonly known as: TYLENOL Take 2 tablets (650 mg total) by mouth every 4 (four) hours as needed for mild pain.   Acetaminophen-Codeine 300-30 MG tablet Take 1 tablet by mouth every 6 (six) hours as needed for pain.   dexamethasone 4 MG tablet Commonly known as: DECADRON Take 1 tablet twice  a day for 3 days starting the day after each cycle of chemo What changed:   how much to take  how to take this  when to take this   dronabinol 5 MG capsule Commonly known as: MARINOL Take 1 capsule (5 mg total) by mouth 2 (two) times daily before lunch and supper. What changed:   when to take this  reasons to take this   lidocaine-prilocaine cream Commonly known as: EMLA Apply 1 application topically as needed. Apply to port site What changed:   reasons to take this  additional instructions   LORazepam 0.5 MG tablet Commonly known as: ATIVAN Take 1 tablet (0.5 mg total) by mouth every 8 (eight) hours as needed for anxiety.   ondansetron 8 MG tablet Commonly known as: ZOFRAN TAKE 1 TABLET BY MOUTH EVERY 8 HOURS AS NEEDED FOR NAUSEA OR VOMITING.   polyethylene glycol 17 g packet Commonly known as: MIRALAX / GLYCOLAX Take 17 g by mouth daily as needed. What changed: when to take this   prochlorperazine 10 MG tablet Commonly known as: COMPAZINE TAKE 1 TABLET BY MOUTH EVERY 6 HOURS AS NEEDED FOR NAUSEA OR VOMITING. What changed:   how much to  take  how to take this  when to take this  reasons to take this  additional instructions   senna-docusate 8.6-50 MG tablet Commonly known as: Senokot-S Take 1 tablet by mouth 2 (two) times daily as needed for mild constipation.   sodium bicarbonate/sodium chloride Soln 1 application by Mouth Rinse route 4 (four) times daily.   Ziextenzo 6 MG/0.6ML injection Generic drug: pegfilgrastim-bmez Inject 0.6 mLs (6 mg total) into the skin See admin instructions. Inject 0.6 mLs (6 mg total) into the skin once for 1 dose. 24 hours after each cycle of EPOCH. To be administered by Home Health RN arranged by [facility]oInject 6 mg into the skin See admin instructions.         HPI:  Bryan [name] is a 65 y.o. male who has been referred to [place] by Dr. Tammi Klippel for evaluation and management of lung mass.  About 6 months ago pt  began having abdominal pain after eating. He saw several physicians who could not explain his symptoms.He then developedextreme discomfort in his chest and red stools. He is unsure if the color of his stools was from blood or diet. Pt then began experiencing left arm pain and pleuritic chest pain, which triggered his Chest CTAin October.CTA chest ruled out PE but showed bulky mediastinal and bilateral hilar adenopathy. He was referred to hematology oncology at [facility] and a PET scan and biopsy were ordered. There were significant delays in getting these tests completed. Bryan [name] then establish care here in [place], [place] with hopes of expediting his work-up. He underwent a PET scan on 04/08/2020 which showed intensely hypermetabolic bilateral neck, bilateral axillary, bilateral mediastinal, bilateral hilar, and retroperitoneal lymphadenopathy, numerous small hypermetabolic splenic masses. He then had an ultrasound-guided biopsy of a left supraclavicular lymph node which showed diffuse large B-cell lymphoma. MRI of the brain with and without contrast was performed on 04/18/2020 which showed no acute intracranial process and no evidence for metastatic disease. It was recommended for the patient to begin systemic chemotherapy with EPOCH-R.  A restaging PET scan 05/28/2020 revealed a marked interval response to therapy with resolution of almost all areas of increased FDG activity. No sign of progressive disease.  The patient was seen on the day of admission prior to cycle #6 of his chemotherapy.  On admission, he reported that he was feeling well.  He was not having any fevers, chills, mucositis, chest pain, shortness of breath, cough, abdominal pain, nausea, vomiting, constipation.  He was not having any recurrent headaches.  The patient was admitted on 08/04/2020 for cycle #6 of EPOCH-R.  Hospital Course: Bryan [name] started his chemotherapy as planned on the day of admission.  He  overall tolerated his chemotherapy well with the exception of grade 1 nausea.  No vomiting was reported this admission.  Nausea was overall well controlled with as needed antiemetics.  He did not develop any mucositis this admission.  He developed very mild hypokalemia on the day of discharge and received a one-time dose of K-Dur 20 mEq p.o.  This medication will not be continued as an outpatient.  The patient was seen on the morning of 08/08/2020 and was deemed to be stable for discharge.  The patient will discharge to home once chemotherapy is complete.  Refills were provided for lorazepam and Tylenol #3.  He was advised to limit his use of Tylenol #3 as this can cause headaches and also confusion/dissociative state.   The patient will administer G-CSF at home on 08/09/2020.  He will return to our office on 3/7//2022 for rituximab and will have labs and a follow-up visit on 08/18/2020.  Physical Exam Vitals reviewed.  Constitutional:      General: He is not in acute distress.    Appearance: Normal appearance.  HENT:     Head: Normocephalic and atraumatic.     Mouth/Throat:     Pharynx: Oropharynx is clear. No oropharyngeal exudate or posterior oropharyngeal erythema.  Eyes:     General: No scleral icterus.    Pupils: Pupils are equal, round, and reactive to light.  Cardiovascular:     Rate and Rhythm: Normal rate and regular rhythm.  Pulmonary:     Effort: Pulmonary effort is normal. No respiratory distress.     Breath sounds: Normal breath sounds.  Abdominal:     General: Abdomen is flat. Bowel sounds are normal. There is no distension.     Palpations: Abdomen is soft.  Musculoskeletal:        General: Normal range of motion.  Skin:    General: Skin is warm and dry.     Findings: No rash.  Neurological:     General: No focal deficit present.     Mental Status: He is alert and oriented to person, place, and time. Mental status is at baseline.  Psychiatric:        Mood and Affect: Mood  normal.        Behavior: Behavior normal.        Thought Content: Thought content normal.        Judgment: Judgment normal.    Discharge Instructions    Activity as tolerated - No restrictions   Complete by: As directed    Diet general   Complete by: As directed       Signed: Mikey Bussing 08/08/2020, 10:01 AM    ADDENDUM  .Patient was Personally and independently interviewed, examined and relevant elements of the discharge plan which discussed in details with Mikey Bussing DNP. The above documentation reflects our combined findings assessment and plan. Patient has a follow-up visit with me on 08/18/2020.  He intends to go to the beach.  We discussed that if he prefers he can have labs drawn locally which she can send to [place] for review.  This will save him a long drive back to [place].  He will call us if he feels differently.  We will set him up for a PET CT scan in 4 weeks and return to clinic with port flush and labs in 5 weeks.  [place] Lone MD MS TT> 30 minutes

## 2020-08-11 ENCOUNTER — Inpatient Hospital Stay: Payer: 59 | Attending: Hematology

## 2020-08-11 ENCOUNTER — Other Ambulatory Visit: Payer: Self-pay

## 2020-08-11 VITALS — BP 92/55 | HR 63 | Temp 97.6°F | Resp 17 | Wt 137.0 lb

## 2020-08-11 DIAGNOSIS — I700 Atherosclerosis of aorta: Secondary | ICD-10-CM | POA: Diagnosis not present

## 2020-08-11 DIAGNOSIS — Z8546 Personal history of malignant neoplasm of prostate: Secondary | ICD-10-CM | POA: Diagnosis not present

## 2020-08-11 DIAGNOSIS — C8338 Diffuse large B-cell lymphoma, lymph nodes of multiple sites: Secondary | ICD-10-CM

## 2020-08-11 DIAGNOSIS — C8598 Non-Hodgkin lymphoma, unspecified, lymph nodes of multiple sites: Secondary | ICD-10-CM | POA: Insufficient documentation

## 2020-08-11 DIAGNOSIS — Z7189 Other specified counseling: Secondary | ICD-10-CM

## 2020-08-11 DIAGNOSIS — Z5112 Encounter for antineoplastic immunotherapy: Secondary | ICD-10-CM | POA: Insufficient documentation

## 2020-08-11 MED ORDER — SODIUM CHLORIDE 0.9 % IV SOLN
Freq: Once | INTRAVENOUS | Status: AC
  Filled 2020-08-11: qty 250

## 2020-08-11 MED ORDER — METHYLPREDNISOLONE SODIUM SUCC 125 MG IJ SOLR
80.0000 mg | Freq: Every day | INTRAMUSCULAR | Status: DC
  Administered 2020-08-11: 80 mg via INTRAVENOUS

## 2020-08-11 MED ORDER — ACETAMINOPHEN 325 MG PO TABS
650.0000 mg | ORAL_TABLET | Freq: Once | ORAL | Status: AC
  Administered 2020-08-11: 650 mg via ORAL

## 2020-08-11 MED ORDER — FAMOTIDINE IN NACL 20-0.9 MG/50ML-% IV SOLN
20.0000 mg | Freq: Once | INTRAVENOUS | Status: AC
  Administered 2020-08-11: 20 mg via INTRAVENOUS

## 2020-08-11 MED ORDER — MONTELUKAST SODIUM 10 MG PO TABS: 10.0000 mg | ORAL_TABLET | Freq: Every day | ORAL | Status: DC

## 2020-08-11 MED ORDER — RITUXIMAB-PVVR CHEMO 500 MG/50ML IV SOLN
375.0000 mg/m2 | Freq: Once | INTRAVENOUS | Status: AC
  Administered 2020-08-11: 600 mg via INTRAVENOUS
  Filled 2020-08-11: qty 50

## 2020-08-11 MED ORDER — FAMOTIDINE IN NACL 20-0.9 MG/50ML-% IV SOLN
INTRAVENOUS | Status: AC
  Filled 2020-08-11: qty 50

## 2020-08-11 MED ORDER — MONTELUKAST SODIUM 10 MG PO TABS
10.0000 mg | ORAL_TABLET | Freq: Once | ORAL | Status: AC
  Administered 2020-08-11: 10 mg via ORAL

## 2020-08-11 MED ORDER — HEPARIN SOD (PORK) LOCK FLUSH 100 UNIT/ML IV SOLN
500.0000 [IU] | Freq: Once | INTRAVENOUS | Status: AC | PRN
  Administered 2020-08-11: 500 [IU]
  Filled 2020-08-11: qty 5

## 2020-08-11 MED ORDER — MONTELUKAST SODIUM 10 MG PO TABS
ORAL_TABLET | ORAL | Status: AC
  Filled 2020-08-11: qty 1

## 2020-08-11 MED ORDER — ACETAMINOPHEN 325 MG PO TABS
ORAL_TABLET | ORAL | Status: AC
  Filled 2020-08-11: qty 2

## 2020-08-11 MED ORDER — DIPHENHYDRAMINE HCL 25 MG PO CAPS
ORAL_CAPSULE | ORAL | Status: AC
  Filled 2020-08-11: qty 2

## 2020-08-11 MED ORDER — METHYLPREDNISOLONE SODIUM SUCC 125 MG IJ SOLR
INTRAMUSCULAR | Status: AC
  Filled 2020-08-11: qty 2

## 2020-08-11 MED ORDER — DIPHENHYDRAMINE HCL 25 MG PO CAPS
50.0000 mg | ORAL_CAPSULE | Freq: Once | ORAL | Status: AC
  Administered 2020-08-11: 50 mg via ORAL

## 2020-08-11 MED ORDER — SODIUM CHLORIDE 0.9% FLUSH
10.0000 mL | INTRAVENOUS | Status: DC | PRN
Start: 2020-08-11 — End: 2020-08-11
  Administered 2020-08-11: 10 mL
  Filled 2020-08-11: qty 10

## 2020-08-11 NOTE — Patient Instructions (Signed)
[facility] Discharge Instructions for Patients Receiving Chemotherapy  Today you received the following chemotherapy agent: Rituximab  To help prevent nausea and vomiting after your treatment, we encourage you to take your nausea medication as directed by your MD.   If you develop nausea and vomiting that is not controlled by your nausea medication, call the clinic.   BELOW ARE SYMPTOMS THAT SHOULD BE REPORTED IMMEDIATELY:  *FEVER GREATER THAN 100.5 F  *CHILLS WITH OR WITHOUT FEVER  NAUSEA AND VOMITING THAT IS NOT CONTROLLED WITH YOUR NAUSEA MEDICATION  *UNUSUAL SHORTNESS OF BREATH  *UNUSUAL BRUISING OR BLEEDING  TENDERNESS IN MOUTH AND THROAT WITH OR WITHOUT PRESENCE OF ULCERS  *URINARY PROBLEMS  *BOWEL PROBLEMS  UNUSUAL RASH Items with * indicate a potential emergency and should be followed up as soon as possible.  Feel free to call the clinic should you have any questions or concerns. The clinic phone number is [phone].  Please show the [place] at check-in to the Emergency Department and triage nurse.

## 2020-08-12 ENCOUNTER — Encounter: Payer: Self-pay | Admitting: Hematology

## 2020-08-15 ENCOUNTER — Telehealth: Payer: Self-pay | Admitting: Hematology

## 2020-08-15 NOTE — Telephone Encounter (Signed)
Cancelled 03/14 appointments per patient's request, patient does not want to reschedule at this time.

## 2020-08-18 ENCOUNTER — Inpatient Hospital Stay: Payer: 59

## 2020-08-18 ENCOUNTER — Inpatient Hospital Stay: Payer: 59 | Admitting: Hematology

## 2020-08-22 ENCOUNTER — Other Ambulatory Visit: Payer: Self-pay | Admitting: Hematology

## 2020-08-22 DIAGNOSIS — C8338 Diffuse large B-cell lymphoma, lymph nodes of multiple sites: Secondary | ICD-10-CM

## 2020-08-22 NOTE — Telephone Encounter (Signed)
Please review for refill, Thank you. 

## 2020-09-10 ENCOUNTER — Ambulatory Visit (HOSPITAL_COMMUNITY)
Admission: RE | Admit: 2020-09-10 | Discharge: 2020-09-10 | Disposition: A | Discharge status: Home / Self Care | Payer: 59 | Source: Ambulatory Visit | Attending: Hematology | Admitting: Hematology

## 2020-09-10 ENCOUNTER — Other Ambulatory Visit: Payer: Self-pay

## 2020-09-10 DIAGNOSIS — C8338 Diffuse large B-cell lymphoma, lymph nodes of multiple sites: Secondary | ICD-10-CM | POA: Insufficient documentation

## 2020-09-10 LAB — GLUCOSE, CAPILLARY: Glucose-Capillary: 83 mg/dL (ref 70–99)

## 2020-09-10 MED ORDER — FLUDEOXYGLUCOSE F - 18 (FDG) INJECTION
8.0000 | Freq: Once | INTRAVENOUS | Status: AC | PRN
  Administered 2020-09-10: 7.16 via INTRAVENOUS

## 2020-09-14 NOTE — Progress Notes (Signed)
HEMATOLOGY/ONCOLOGY CLINIC NOTE  Date of Service: 09/15/2020  Patient Care Team: System, Provider Not In as PCP - General  CHIEF COMPLAINTS/PURPOSE OF CONSULTATION:  mx of large B cell lymphoma  HISTORY OF PRESENTING ILLNESS:   Bryan [name] is a wonderful 65 y.o. male who has been referred to [place] by Dr. Tammi Klippel for evaluation and management of lung mass. Pt is accompanied today by his wife. The pt reports that he is doing well overall.   The pt reports that he was diagnosed with low-grade Prostate Cancer in 2013. Pt had no symptoms, but got a check up after his father was diagnosed with Prostate Cancer. He was only treated with cryoablation. He also has a reducible, right inguinal hernia. Pt denies any other medical conditions or chronic medications. He does take a daily fish oil and multivitamin. He has NKDA.   About 6 months ago pt began having abdominal pain after eating. He saw several physicians who could not explain his symptoms. Last month he began having extreme discomfort in his chest and red stools. He is unsure if the color of his stools was from blood or diet. Pt then began experiencing left arm pain and pleuritic chest pain, which triggered his Chest CT in October.  In the last few nights pt has had roaming headaches that are improved with OTC Tylenol. Pt has lost 4-5 pounds recently, but attributes this to retiring and running more. Pt is currently taking two Oxycodone twice per day, which is controlling his discomfort. His wife also notes that the pt appears more fatigued than usual and is napping more. Pt felt poorly for 1 week after his second COVID19 vaccine, which he recived in March. Pt received the [place] vaccine, but waiting to receive the booster.   Of note prior to the patient's visit today, pt has had PET/CT ([id number]) completed on 04/08/2020 with results revealing "1. Intensely hypermetabolic bilateral neck, bilateral axillary, bilateral mediastinal,  bilateral hilar and retroperitoneal lymphadenopathy. Numerous small hypermetabolic splenic masses. Findings are compatible with malignancy, favoring lymphoproliferative disorder. The most accessible pathologic lymph nodes for potential percutaneous biopsy are likely within the left axilla and left supraclavicular neck."   Most recent lab results (03/31/2020) of CBC is as follows: all values are WNL except for HCT at 41.2, MPV at 9.7, Neutro Rel at 70.3, Immature Gran Rel at 0.4, Lymphs Rel at 15.8, Mono Rel at 10.3. 03/31/2020 D-Dimer at 0.51  On review of systems, pt reports headaches, chest pain, improved diarrhea, fatigue, mild productive cough, abdominal fullness and denies fevers, chills, night sweats, unexpected weight loss, low appetite, SOB, testicular pain/swelling, leg swelling, rash and any other symptoms.   On PMHx the pt reports Prostate Cancer, Cryotherapy, Inguinal hernia. 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 On Family Hx the pt reports a father with Prostate Cancer and a mother with Esophageal and Bladder Cancer (was a smoker).   INTERVAL HISTORY: Bryan [name] is a wonderful 65 y.o. male who is here for evaluation and management of newly diagnosed Large B-cell lymphoma. We are joined today by his wife. The patient's last visit with [place] was on 07/25/2020. The pt reports that he is doing well overall.  The pt reports that he has been gradually improving. The pt notes some mild intermittent morning nausea improved throughout the day. The pt notes he has been running on the treadmill gradually and has been able to recover better and improve his stamina. The  pt notes he has been eating well and is not skipping meals. The pt notes he has been travelling some as well and getting back to normal way of life.  The pt has had PET Skull Base to Thigh ([id number]) on 09/10/2020, which revealed "1. Continued reduction in metabolic activity of small subcarinal lymph nodes  with activity equal to liver ( Deauville 3). 2. Reduction in metabolic activity of spleen which is now normal ( Deauville 2 ). 3. No evidence of new or progressive lymphoma."  On review of systems, pt reports intermittent nausea and denies fevers, chills, night sweats, abdominal pain, leg swelling, and any other symptoms.  MEDICAL HISTORY:  Past Medical History:  Diagnosis Date  . Prostate cancer [facility])     SURGICAL HISTORY: Past Surgical History:  Procedure Laterality Date  . CRYOTHERAPY    . IR IMAGING GUIDED PORT INSERTION  05/08/2020  . PROSTATE SURGERY      SOCIAL HISTORY: Social History   Socioeconomic History  . Marital status: Married    Spouse name: 1  . Number of children: Not on file  . Years of education: Not on file  . Highest education level: Not on file  Occupational History  . Occupation: Futures trader: [employer]    Comment: medical leave  Tobacco Use  . Smoking status: Never Smoker  . Smokeless tobacco: Never Used  Vaping Use  . Vaping Use: Never used  Substance and Sexual Activity  . Alcohol use: Not Currently  . Drug use: Never  . Sexual activity: Yes  Other Topics Concern  . Not on file  Social History Narrative  . Not on file   Social Determinants of Health   Financial Resource Strain: Not on file  Food Insecurity: Not on file  Transportation Needs: Not on file  Physical Activity: Not on file  Stress: Not on file  Social Connections: Not on file  Intimate Partner Violence: Not on file    FAMILY HISTORY: Family History  Problem Relation Age of Onset  . Bladder Cancer Mother   . Esophageal cancer Mother   . Prostate cancer Father   . Prostate cancer Paternal Uncle     ALLERGIES:  has No Known Allergies.  MEDICATIONS:  Current Outpatient Medications  Medication Sig Dispense Refill  . acetaminophen (TYLENOL) 325 MG tablet Take 2 tablets (650 mg total) by mouth every 4 (four) hours as needed for mild pain. (Patient not  taking: No sig reported)    . Acetaminophen-Codeine 300-30 MG tablet Take 1 tablet by mouth every 6 (six) hours as needed for pain. 30 tablet 0  . dexamethasone (DECADRON) 4 MG tablet Take 1 tablet twice a day for 3 days starting the day after each cycle of chemo (Patient taking differently: Take 4 mg by mouth See admin instructions. Take 1 tablet twice a day for 3 days starting the day after each cycle of chemo) 20 tablet 1  . dronabinol (MARINOL) 5 MG capsule Take 1 capsule (5 mg total) by mouth 2 (two) times daily before lunch and supper. (Patient taking differently: Take 5 mg by mouth 2 (two) times daily as needed (appetite).) 60 capsule 0  . lidocaine-prilocaine (EMLA) cream Apply 1 application topically as needed. Apply to port site (Patient taking differently: Apply 1 application topically as needed (port access).) 30 g 0  . LORazepam (ATIVAN) 0.5 MG tablet Take 1 tablet (0.5 mg total) by mouth every 8 (eight) hours as needed for anxiety. 30 tablet  0  . ondansetron (ZOFRAN) 8 MG tablet TAKE 1 TABLET BY MOUTH EVERY 8 HOURS AS NEEDED FOR NAUSEA OR VOMITING. (Patient not taking: Reported on 08/04/2020) 30 tablet 0  . pegfilgrastim-bmez (ZIEXTENZO) 6 MG/0.6ML injection Inject 0.6 mLs (6 mg total) into the skin See admin instructions. Inject 0.6 mLs (6 mg total) into the skin once for 1 dose. 24 hours after each cycle of EPOCH. To be administered by Home Health RN arranged by [facility]redoInject 6 mg into the skin See admin instructions. 0.6 mL 4  . polyethylene glycol (MIRALAX / GLYCOLAX) 17 g packet Take 17 g by mouth daily as needed. (Patient taking differently: Take 17 g by mouth daily.) 14 each 0  . prochlorperazine (COMPAZINE) 10 MG tablet TAKE 1 TABLET BY MOUTH EVERY 6 HOURS AS NEEDED FOR NAUSEA OR VOMITING. (Patient taking differently: Take 10 mg by mouth every 6 (six) hours as needed for nausea or vomiting.) 30 tablet 2  . senna-docusate (SENOKOT-S) 8.6-50 MG tablet Take 1 tablet by mouth 2 (two)  times daily as needed for mild constipation.    . Sodium Chloride-Sodium Bicarb (SODIUM BICARBONATE/SODIUM CHLORIDE) SOLN 1 application by Mouth Rinse route 4 (four) times daily.     No current facility-administered medications for this visit.    REVIEW OF SYSTEMS:   10 Point review of Systems was done is negative except as noted above.  PHYSICAL EXAMINATION: ECOG PERFORMANCE STATUS: 1 - Symptomatic but completely ambulatory  . Vitals:   09/15/20 1038  BP: 94/60  Pulse: 66  Resp: 15  Temp: (!) 97.4 F (36.3 C)  SpO2: 100%   Filed Weights   09/15/20 1038  Weight: 139 lb 11.2 oz (63.4 kg)   .Body mass index is 21.24 kg/m.   NAD. GENERAL:alert, in no acute distress and comfortable SKIN: no acute rashes, no significant lesions EYES: conjunctiva are pink and non-injected, sclera anicteric OROPHARYNX: MMM, no exudates, no oropharyngeal erythema or ulceration NECK: supple, no JVD LYMPH:  no palpable lymphadenopathy in the cervical, axillary or inguinal regions LUNGS: clear to auscultation b/l with normal respiratory effort HEART: regular rate & rhythm ABDOMEN:  normoactive bowel sounds , non tender, not distended. Extremity: no pedal edema PSYCH: alert & oriented x 3 with fluent speech NEURO: no focal motor/sensory deficits   LABORATORY DATA:  I have reviewed the data as listed  . CBC Latest Ref Rng & Units 08/08/2020 08/07/2020 08/06/2020  WBC 4.0 - 10.5 K/uL 5.4 11.8(H) 22.6(H)  Hemoglobin 13.0 - 17.0 g/dL 9.1(L) 9.1(L) 9.0(L)  Hematocrit 39.0 - 52.0 % 27.0(L) 27.3(L) 27.6(L)  Platelets 150 - 400 K/uL 291 342 317   . CMP Latest Ref Rng & Units 08/08/2020 08/07/2020 08/06/2020  Glucose 70 - 99 mg/dL 92 95 102(H)  BUN 8 - 23 mg/dL _0 Creatinine 0.61 - 1.24 mg/dL 0.62 0.63 0.64  Sodium 135 - 145 mmol/L 137 139 137  Potassium 3.5 - 5.1 mmol/L 3.4(L) 3.7 3.5  Chloride 98 - 111 mmol/L 106 107 107  CO2 22 - 32 mmol/L _1 Calcium 8.9 - 10.3 mg/dL 8.7(L) 8.6(L)  8.7(L)  Total Protein 6.5 - 8.1 g/dL 5.1(L) 5.3(L) 5.5(L)  Total Bilirubin 0.3 - 1.2 mg/dL 0.9 0.5 0.5  Alkaline Phos 38 - 126 U/L 58 64 71  AST 15 - 41 U/L _2 ALT 0 - 44 U/L 31 32 29   04/10/2020 FISH Analysis ([id number]):   04/10/2020 Left Cervical Lymph Node Bx ([id number]):  RADIOGRAPHIC STUDIES: I have personally reviewed the radiological images as listed and agreed with the findings in the report. NM PET Image Restag (PS) Skull Base To Thigh  Result Date: 09/12/2020 CLINICAL DATA:  Subsequent treatment strategy for diffuse large B-cell lymphoma. EXAM: NUCLEAR MEDICINE PET SKULL BASE TO THIGH TECHNIQUE: 7.2 mCi F-18 FDG was injected intravenously. Full-ring PET imaging was performed from the skull base to thigh after the radiotracer. CT data was obtained and used for attenuation correction and anatomic localization. Fasting blood glucose: 83 mg/dl COMPARISON:  PET-CT 05/28/2020, PET-CT 04/08/2020 FINDINGS: Mediastinal blood pool activity: SUV max 1.9 cm Liver activity: SUV max 2.37 NECK: No hypermetabolic lymph nodes in the neck. Incidental CT findings: none CHEST: Again demonstrated low metabolic activity associated with a subcarinal lymph node which is decreased from comparison exam with SUV max equal 3.0 compared SUV max equal 4.3. Lymph nodes mildly enlarged at 13 mm. No hypermetabolic supraclavicular adenopathy or axillary adenopathy. Incidental CT findings: No suspicious pulmonary nodularity. Port in the anterior chest wall with tip in distal SVC. Mild basilar atelectasis. ABDOMEN/PELVIS: Spleen is normal size and reduced metabolic activity compared to prior with SUV max equal 1.8 decreased from SUV max equal 3.2. No hypermetabolic or enlarged retroperitoneal, periportal or pelvic lymph nodes. Normal testicles. Incidental CT findings: none SKELETON: No focal hypermetabolic activity to suggest skeletal metastasis. Incidental CT findings: none IMPRESSION: 1. Continued reduction in  metabolic activity of small subcarinal lymph nodes with activity equal to liver ( Deauville 3). 2. Reduction in metabolic activity of spleen which is now normal ( Deauville 2 ) 3. No evidence of new or progressive lymphoma. Electronically Signed   By: Suzy Bouchard M.D.   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 Intensely hypermetabolic bilateral neck, bilateral axillary, bilateral mediastinal, bilateral hilar and retroperitoneal lymphadenopathy. Numerous small hypermetabolic splenic masses. Findings are compatible with malignancy, favoring lymphoproliferative disorder." 04/10/2020 Left Cervical Lymph Node Bx ([id number]) revealed "Diffuse large B-cell lymphoma. KI-67 is 80-90%" FISH panel neg for double/triplet hit lymphoma PET/CT 12/22 "1. Marked interval response to therapy with resolution of nearly all areas of increased FDG uptake compared to the previous study. Most areas with residual uptake Deauville category 3. Subcarinal and RIGHT thoracic inlet with uptake slightly greater than liver, technically Deauville category 4 as described. Maximum SUV on today's study is 4.3 in the subcarinal region. 2. No new signs of disease. 3. Diffuse marrow activity presumably related to marrow stimulation. Suggest attention on follow-up. Aortic Atherosclerosis (ICD10-I70.0)."   PLAN: -Discussed pt PET Skull Base to Thigh ([id number]) on 09/10/2020; very good response. No signs of active disease. Nothing new or progressive, everything continuing to shrink. Will get repeat imagining in 6 months. -Discussed Evusheld and pt's eligibility. Will send referral. Advised pt this provided protection for 4-6 months. -Advised pt that most of the antibody producing machinery response drops 40-50% and recovers within 6 months. The T-cell mediated immunity drops 20-30% but takes 2  years to recover. The further out from treatment, the better the immune system is overall. -Advised pt that travel and travel on airplanes is up to the pt's personal choice and priorities. -Recommended pt receive the second COVID booster when eligible. Advised pt he is more than 2 months out and can get now. Wait for Evusheld decision. -Advised pt that it may take 2-3 months to recover from  treatment-related fatigue due to aggressive nature of treatment. -Advised pt we can take Port out now at this time. Will schedule this. -Recommend pt continue to drink 48-64 oz water daily, eat well, walk 20-30 min daily, and get proper sleep. -Continue 2000 IU Vitamin D and Vitamin B complex once daily. -Will get labs today following visit. -Will see back in 3 months with labs.   FOLLOW UP: Labs today Port a cath removal in 2 weeks by IR Referral to Evusheld RTC with Dr Irene Limbo with labs in 3 months   The total time spent in the appointment was 30 minutes and more than 50% was on counseling and direct patient cares.   All of the patient's questions were answered with apparent satisfaction. The patient knows to call the clinic with any problems, questions or concerns.    Sullivan Lone MD [place] AAHIVMS [facility] Hematology/Oncology Physician [facility]  (Office):       [phone] (Work cell):  [phone] (Fax):           [phone]  09/15/2020 11:51 AM  I, Reinaldo Raddle, am acting as scribe for Dr. Sullivan Lone, MD.   .I have reviewed the above documentation for accuracy and completeness, and I agree with the above. Brunetta Genera MD

## 2020-09-15 ENCOUNTER — Inpatient Hospital Stay: Payer: 59

## 2020-09-15 ENCOUNTER — Other Ambulatory Visit: Payer: Self-pay

## 2020-09-15 ENCOUNTER — Inpatient Hospital Stay: Payer: 59 | Attending: Hematology | Admitting: Hematology

## 2020-09-15 VITALS — BP 94/60 | HR 66 | Temp 97.4°F | Resp 15 | Ht 68.0 in | Wt 139.7 lb

## 2020-09-15 DIAGNOSIS — Z8546 Personal history of malignant neoplasm of prostate: Secondary | ICD-10-CM | POA: Diagnosis not present

## 2020-09-15 DIAGNOSIS — C8338 Diffuse large B-cell lymphoma, lymph nodes of multiple sites: Secondary | ICD-10-CM

## 2020-09-15 DIAGNOSIS — C8598 Non-Hodgkin lymphoma, unspecified, lymph nodes of multiple sites: Secondary | ICD-10-CM | POA: Insufficient documentation

## 2020-09-15 DIAGNOSIS — Z800 Family history of malignant neoplasm of digestive organs: Secondary | ICD-10-CM | POA: Insufficient documentation

## 2020-09-15 DIAGNOSIS — Z8042 Family history of malignant neoplasm of prostate: Secondary | ICD-10-CM | POA: Insufficient documentation

## 2020-09-15 DIAGNOSIS — Z8052 Family history of malignant neoplasm of bladder: Secondary | ICD-10-CM | POA: Insufficient documentation

## 2020-09-15 DIAGNOSIS — Z95828 Presence of other vascular implants and grafts: Secondary | ICD-10-CM

## 2020-09-15 DIAGNOSIS — I700 Atherosclerosis of aorta: Secondary | ICD-10-CM | POA: Insufficient documentation

## 2020-09-15 LAB — CMP (CANCER CENTER ONLY)
ALT: 26 U/L (ref 0–44)
AST: 21 U/L (ref 15–41)
Albumin: 3.9 g/dL (ref 3.5–5.0)
Alkaline Phosphatase: 69 U/L (ref 38–126)
Anion gap: 11 (ref 5–15)
BUN: 8 mg/dL (ref 8–23)
CO2: 25 mmol/L (ref 22–32)
Calcium: 9.1 mg/dL (ref 8.9–10.3)
Chloride: 106 mmol/L (ref 98–111)
Creatinine: 0.73 mg/dL (ref 0.61–1.24)
GFR, Estimated: >60 mL/min (ref >60)
Glucose, Bld: 78 mg/dL (ref 70–99)
Potassium: 4.5 mmol/L (ref 3.5–5.1)
Sodium: 142 mmol/L (ref 135–145)
Total Bilirubin: 0.4 mg/dL (ref 0.3–1.2)
Total Protein: 6.1 g/dL — ABNORMAL LOW (ref 6.5–8.1)

## 2020-09-15 LAB — CBC WITH DIFFERENTIAL/PLATELET
Abs Immature Granulocytes: 0.02 10*3/uL (ref 0.00–0.07)
Basophils Absolute: 0.1 10*3/uL (ref 0.0–0.1)
Basophils Relative: 2 %
Eosinophils Absolute: 0.2 10*3/uL (ref 0.0–0.5)
Eosinophils Relative: 3 %
HCT: 37.6 % — ABNORMAL LOW (ref 39.0–52.0)
Hemoglobin: 12.6 g/dL — ABNORMAL LOW (ref 13.0–17.0)
Immature Granulocytes: 0 %
Lymphocytes Relative: 18 %
Lymphs Abs: 1.1 10*3/uL (ref 0.7–4.0)
MCH: 31 pg (ref 26.0–34.0)
MCHC: 33.5 g/dL (ref 30.0–36.0)
MCV: 92.4 fL (ref 80.0–100.0)
Monocytes Absolute: 0.6 10*3/uL (ref 0.1–1.0)
Monocytes Relative: 11 %
Neutro Abs: 4 10*3/uL (ref 1.7–7.7)
Neutrophils Relative %: 66 %
Platelets: 240 10*3/uL (ref 150–400)
RBC: 4.07 MIL/uL — ABNORMAL LOW (ref 4.22–5.81)
RDW: 13.2 % (ref 11.5–15.5)
WBC: 6.1 10*3/uL (ref 4.0–10.5)
nRBC: 0 % (ref 0.0–0.2)

## 2020-09-15 LAB — LACTATE DEHYDROGENASE: LDH: 152 U/L (ref 98–192)

## 2020-09-23 ENCOUNTER — Encounter: Payer: Self-pay | Admitting: Hematology

## 2020-09-29 ENCOUNTER — Telehealth: Payer: Self-pay | Admitting: Adult Health

## 2020-09-29 NOTE — Telephone Encounter (Signed)
I called patient to discuss Evusheld, a long acting monoclonal antibody injection administered to patients with decreased immune systems or intolerance/allergy to the COVID 19 vaccine as COVID19 prevention.    Unable to reach patient.  LMOM to return my call.  Ladarien Beeks, NP  

## 2020-10-07 ENCOUNTER — Other Ambulatory Visit: Payer: Self-pay | Admitting: Hematology

## 2020-10-12 ENCOUNTER — Other Ambulatory Visit: Payer: Self-pay | Admitting: Radiology

## 2020-10-13 ENCOUNTER — Other Ambulatory Visit: Payer: Self-pay | Admitting: Student

## 2020-10-14 ENCOUNTER — Ambulatory Visit (HOSPITAL_COMMUNITY)
Admission: RE | Admit: 2020-10-14 | Discharge: 2020-10-14 | Disposition: A | Discharge status: Home / Self Care | Payer: 59 | Source: Ambulatory Visit | Attending: Hematology | Admitting: Hematology

## 2020-10-14 ENCOUNTER — Other Ambulatory Visit: Payer: Self-pay

## 2020-10-14 DIAGNOSIS — Z8572 Personal history of non-Hodgkin lymphomas: Secondary | ICD-10-CM | POA: Insufficient documentation

## 2020-10-14 DIAGNOSIS — Z95828 Presence of other vascular implants and grafts: Secondary | ICD-10-CM

## 2020-10-14 DIAGNOSIS — Z79899 Other long term (current) drug therapy: Secondary | ICD-10-CM | POA: Insufficient documentation

## 2020-10-14 DIAGNOSIS — Z452 Encounter for adjustment and management of vascular access device: Secondary | ICD-10-CM | POA: Diagnosis present

## 2020-10-14 DIAGNOSIS — C8338 Diffuse large B-cell lymphoma, lymph nodes of multiple sites: Secondary | ICD-10-CM

## 2020-10-14 DIAGNOSIS — Z8546 Personal history of malignant neoplasm of prostate: Secondary | ICD-10-CM | POA: Diagnosis not present

## 2020-10-14 HISTORY — PX: IR REMOVAL TUN ACCESS W/ PORT W/O FL MOD SED: IMG2290

## 2020-10-14 LAB — CBC WITH DIFFERENTIAL/PLATELET
Abs Immature Granulocytes: 0.01 10*3/uL (ref 0.00–0.07)
Basophils Absolute: 0.1 10*3/uL (ref 0.0–0.1)
Basophils Relative: 2 %
Eosinophils Absolute: 0.2 10*3/uL (ref 0.0–0.5)
Eosinophils Relative: 4 %
HCT: 42.7 % (ref 39.0–52.0)
Hemoglobin: 14.1 g/dL (ref 13.0–17.0)
Immature Granulocytes: 0 %
Lymphocytes Relative: 17 %
Lymphs Abs: 0.8 10*3/uL (ref 0.7–4.0)
MCH: 30 pg (ref 26.0–34.0)
MCHC: 33 g/dL (ref 30.0–36.0)
MCV: 90.9 fL (ref 80.0–100.0)
Monocytes Absolute: 0.4 10*3/uL (ref 0.1–1.0)
Monocytes Relative: 8 %
Neutro Abs: 3.1 10*3/uL (ref 1.7–7.7)
Neutrophils Relative %: 69 %
Platelets: 251 10*3/uL (ref 150–400)
RBC: 4.7 MIL/uL (ref 4.22–5.81)
RDW: 11.7 % (ref 11.5–15.5)
WBC: 4.5 10*3/uL (ref 4.0–10.5)
nRBC: 0 % (ref 0.0–0.2)

## 2020-10-14 LAB — PROTIME-INR
INR: 0.9 (ref 0.8–1.2)
Prothrombin Time: 12.2 seconds (ref 11.4–15.2)

## 2020-10-14 MED ORDER — MIDAZOLAM HCL 2 MG/2ML IJ SOLN
INTRAMUSCULAR | Status: AC
  Filled 2020-10-14: qty 4

## 2020-10-14 MED ORDER — FENTANYL CITRATE (PF) 100 MCG/2ML IJ SOLN
INTRAMUSCULAR | Status: AC
  Filled 2020-10-14: qty 2

## 2020-10-14 MED ORDER — MIDAZOLAM HCL 2 MG/2ML IJ SOLN
INTRAMUSCULAR | Status: AC | PRN
  Administered 2020-10-14: 2 mg via INTRAVENOUS

## 2020-10-14 MED ORDER — SODIUM CHLORIDE 0.9 % IV SOLN: INTRAVENOUS | Status: DC

## 2020-10-14 MED ORDER — LIDOCAINE-EPINEPHRINE 1 %-1:100000 IJ SOLN
INTRAMUSCULAR | Status: AC
  Filled 2020-10-14: qty 1

## 2020-10-14 MED ORDER — FENTANYL CITRATE (PF) 100 MCG/2ML IJ SOLN
INTRAMUSCULAR | Status: AC | PRN
  Administered 2020-10-14: 50 ug via INTRAVENOUS

## 2020-10-14 MED ORDER — LIDOCAINE-EPINEPHRINE 1 %-1:100000 IJ SOLN
INTRAMUSCULAR | Status: AC | PRN
  Administered 2020-10-14: 10 mL

## 2020-10-14 NOTE — Procedures (Signed)
Interventional Radiology Procedure Note  Procedure: Port removal  Complications: None  Estimated Blood Loss: None  Recommendations: - DC home - Routine line care   Signed,  Criselda Peaches, MD

## 2020-10-14 NOTE — H&P (Signed)
Referring Physician(s): Brunetta Genera  Supervising Physician: Jacqulynn Cadet  Patient Status:  [facility] OP  Chief Complaint:  "I'm getting my port out"  Subjective: Patient familiar to IR service from left supraclavicular lymph node biopsy, PICC placement and Port-A-Cath placement in 2021.  He has a history of diffuse large B-cell lymphoma and is currently in remission.  He presents today for Port-A-Cath removal.  He currently denies fever, headache, chest pain, dyspnea, cough, abdominal/back pain, nausea, vomiting or bleeding.  Past Medical History:  Diagnosis Date  . Prostate cancer [facility])    Past Surgical History:  Procedure Laterality Date  . CRYOTHERAPY    . IR IMAGING GUIDED PORT INSERTION  05/08/2020  . PROSTATE SURGERY        Allergies: Patient has no known allergies.  Medications: Prior to Admission medications   Medication Sig Start Date End Date Taking? Authorizing Provider  acetaminophen (TYLENOL) 325 MG tablet Take 2 tablets (650 mg total) by mouth every 4 (four) hours as needed for mild pain. Patient not taking: No sig reported 06/05/20   Maryanna Shape, NP  Acetaminophen-Codeine 300-30 MG tablet Take 1 tablet by mouth every 6 (six) hours as needed for pain. 08/07/20   Maryanna Shape, NP  dexamethasone (DECADRON) 4 MG tablet Take 1 tablet twice a day for 3 days starting the day after each cycle of chemo Patient taking differently: Take 4 mg by mouth See admin instructions. Take 1 tablet twice a day for 3 days starting the day after each cycle of chemo 05/26/20   Brunetta Genera, MD  dronabinol (MARINOL) 5 MG capsule Take 1 capsule (5 mg total) by mouth 2 (two) times daily before lunch and supper. Patient taking differently: Take 5 mg by mouth 2 (two) times daily as needed (appetite). 05/26/20   Brunetta Genera, MD  lidocaine-prilocaine (EMLA) cream Apply 1 application topically as needed. Apply to port site Patient taking differently: Apply  1 application topically as needed (port access). 05/22/20   Brunetta Genera, MD  LORazepam (ATIVAN) 0.5 MG tablet Take 1 tablet (0.5 mg total) by mouth every 8 (eight) hours as needed for anxiety. 08/07/20   Curcio, Roselie Awkward, NP  ondansetron (ZOFRAN) 8 MG tablet TAKE 1 TABLET BY MOUTH EVERY 8 HOURS AS NEEDED FOR NAUSEA OR VOMITING. Patient not taking: Reported on 08/04/2020 05/26/20   Brunetta Genera, MD  pegfilgrastim-bmez Tyson Dense) 6 MG/0.6ML injection Inject 0.6 mLs (6 mg total) into the skin See admin instructions. Inject 0.6 mLs (6 mg total) into the skin once for 1 dose. 24 hours after each cycle of EPOCH. To be administered by Home Health RN arranged by [facility]/AccredoInject 6 mg into the skin See admin instructions. 05/14/20   Brunetta Genera, MD  polyethylene glycol (MIRALAX / GLYCOLAX) 17 g packet Take 17 g by mouth daily as needed. Patient taking differently: Take 17 g by mouth daily. 04/24/20   Maryanna Shape, NP  prochlorperazine (COMPAZINE) 10 MG tablet TAKE 1 TABLET BY MOUTH EVERY 6 HOURS AS NEEDED FOR NAUSEA OR VOMITING. Patient taking differently: Take 10 mg by mouth every 6 (six) hours as needed for nausea or vomiting. 06/27/20   Curcio, Roselie Awkward, NP  senna-docusate (SENOKOT-S) 8.6-50 MG tablet Take 1 tablet by mouth 2 (two) times daily as needed for mild constipation. 04/24/20   Maryanna Shape, NP  Sodium Chloride-Sodium Bicarb (SODIUM BICARBONATE/SODIUM CHLORIDE) SOLN 1 application by Mouth Rinse route 4 (four) times daily. 05/16/20  Maryanna Shape, NP     Vital Signs: BP 106/70   Pulse (!) 51   Temp 97.7 F (36.5 C) (Oral)   SpO2 100%   Physical Exam awake, alert.  Chest clear to auscultation bilaterally.  Clean, intact right chest wall Port-A-Cath.  Heart with bradycardic but regular rhythm.  Abdomen soft, positive bowel sounds, nontender.  No lower extremity edema.  Imaging: No results found.  Labs:  CBC: Recent Labs    08/07/20 0509  08/08/20 0616 09/15/20 1212 10/14/20 1225  WBC 11.8* 5.4 6.1 4.5  HGB 9.1* 9.1* 12.6* 14.1  HCT 27.3* 27.0* 37.6* 42.7  PLT 342 291 240 251    COAGS: Recent Labs    04/10/20 0557 05/08/20 1250  INR 1.0 1.0    BMP: Recent Labs    08/06/20 0602 08/07/20 0509 08/08/20 0616 09/15/20 1212  NA 137 139 137 142  K 3.5 3.7 3.4* 4.5  CL 107 107 106 106  CO2 22 27 23 25   GLUCOSE 102* 95 92 78  BUN 15 16 15 8   CALCIUM 8.7* 8.6* 8.7* 9.1  CREATININE 0.64 0.63 0.62 0.73  GFRNONAA >60 >60 >60 >60    LIVER FUNCTION TESTS: Recent Labs    08/06/20 0602 08/07/20 0509 08/08/20 0616 09/15/20 1212  BILITOT 0.5 0.5 0.9 0.4  AST 22 23 18 21   ALT 29 32 31 26  ALKPHOS 71 64 58 69  PROT 5.5* 5.3* 5.1* 6.1*  ALBUMIN 3.3* 3.3* 3.1* 3.9    Assessment and Plan: Patient familiar to IR service from left supraclavicular lymph node biopsy, PICC placement and Port-A-Cath placement in 2021.  He has a history of diffuse large B-cell lymphoma and is currently in remission.  He presents today for Port-A-Cath removal.  Details/risks of procedure, including but not limited to, internal bleeding, infection, injury to adjacent structures discussed with patient with his understanding and consent.   Electronically Signed: D. Rowe Robert, PA-C 10/14/2020, 12:51 PM   I spent a total of 20 minutes at the the patient's bedside AND on the patient's hospital floor or unit, greater than 50% of which was counseling/coordinating care for Port-A-Cath removal

## 2020-10-14 NOTE — Discharge Instructions (Signed)
Interventional radiology phone numbers [phone] After hours [phone]  Implanted Port Removal, Care After This sheet gives you information about how to care for yourself after your procedure. Your health care provider may also give you more specific instructions. If you have problems or questions, contact your health care provider. What can I expect after the procedure? After the procedure, it is common to have:  Soreness or pain near your incision.  Some swelling or bruising near your incision. Follow these instructions at home: Medicines  Take over-the-counter and prescription medicines only as told by your health care provider.  If you were prescribed an antibiotic medicine, take it as told by your health care provider. Do not stop taking the antibiotic even if you start to feel better. Bathing 1. Do not take baths, swim, or use a hot tub until your health care provider approves. You may shower tomorrow. Remove your dressing prior to your shower. Incision care 1. Follow instructions from your health care provider about how to take care of your incision. Make sure you: ? Wash your hands with soap and water before you change your bandage (dressing). If soap and water are not available, use hand sanitizer. ? Keep your dressing dry. ? Leave  skin glue in place. These skin closures may need to stay in place for 2 weeks or longer. . 2. Check your incision area every day for signs of infection. Check for: ? More redness, swelling, or pain. ? More fluid or blood. ? Warmth. ? Pus or a bad smell.   Driving  Do not drive for 24 hours if you were given a medicine to help you relax (sedative) during your procedure.  If you did not receive a sedative, ask your health care provider when it is safe to drive.   Activity  Return to your normal activities as told by your health care provider. Ask your health care provider what activities are safe for you.  Do not lift anything that is  heavier than 10 lb (4.5 kg), or the limit that you are told, until your health care provider says that it is safe.  Do not do activities that involve lifting your arms over your head. General instructions  Do not use any products that contain nicotine or tobacco, such as cigarettes and e-cigarettes. These can delay healing. If you need help quitting, ask your health care provider.  Keep all follow-up visits as told by your health care provider. This is important. Contact a health care provider if:  You have more redness, swelling, or pain around your incision.  You have more fluid or blood coming from your incision.  Your incision feels warm to the touch.  You have pus or a bad smell coming from your incision.  You have pain that is not relieved by your pain medicine. Get help right away if you have:  A fever or chills.  Chest pain.  Difficulty breathing. Summary  After the procedure, it is common to have pain, soreness, swelling, or bruising near your incision.  If you were prescribed an antibiotic medicine, take it as told by your health care provider. Do not stop taking the antibiotic even if you start to feel better.  Do not drive for 24 hours if you were given a sedative during your procedure.  Return to your normal activities as told by your health care provider. Ask your health care provider what activities are safe for you. This information is not intended to replace advice given to   you by your health care provider. Make sure you discuss any questions you have with your health care provider. Document Revised: 07/07/2017 Document Reviewed: 07/07/2017 Elsevier Patient Education  2021 Elsevier Inc.     Moderate Conscious Sedation, Adult, Care After This sheet gives you information about how to care for yourself after your procedure. Your health care provider may also give you more specific instructions. If you have problems or questions, contact your health care  provider. What can I expect after the procedure? After the procedure, it is common to have:  Sleepiness for several hours.  Impaired judgment for several hours.  Difficulty with balance.  Vomiting if you eat too soon. Follow these instructions at home: For the time period you were told by your health care provider:  Rest.  Do not participate in activities where you could fall or become injured.  Do not drive or use machinery.  Do not drink alcohol.  Do not take sleeping pills or medicines that cause drowsiness.  Do not make important decisions or sign legal documents.  Do not take care of children on your own.      Eating and drinking 2. Follow the diet recommended by your health care provider. 3. Drink enough fluid to keep your urine pale yellow. 4. If you vomit: ? Drink water, juice, or soup when you can drink without vomiting. ? Make sure you have little or no nausea before eating solid foods.   General instructions 3. Take over-the-counter and prescription medicines only as told by your health care provider. 4. Have a responsible adult stay with you for the time you are told. It is important to have someone help care for you until you are awake and alert. 5. Do not smoke. 6. Keep all follow-up visits as told by your health care provider. This is important. Contact a health care provider if:  You are still sleepy or having trouble with balance after 24 hours.  You feel light-headed.  You keep feeling nauseous or you keep vomiting.  You develop a rash.  You have a fever.  You have redness or swelling around the IV site. Get help right away if:  You have trouble breathing.  You have new-onset confusion at home. Summary  After the procedure, it is common to feel sleepy, have impaired judgment, or feel nauseous if you eat too soon.  Rest after you get home. Know the things you should not do after the procedure.  Follow the diet recommended by your health  care provider and drink enough fluid to keep your urine pale yellow.  Get help right away if you have trouble breathing or new-onset confusion at home. This information is not intended to replace advice given to you by your health care provider. Make sure you discuss any questions you have with your health care provider. Document Revised: 09/21/2019 Document Reviewed: 04/19/2019 Elsevier Patient Education  2021 Elsevier Inc. 

## 2020-12-14 NOTE — Progress Notes (Signed)
HEMATOLOGY/ONCOLOGY CLINIC NOTE  Date of Service: 12/15/2020  Patient Care Team: System, Provider Not In as PCP - General  CHIEF COMPLAINTS/PURPOSE OF CONSULTATION:  mx of large B cell lymphoma  HISTORY OF PRESENTING ILLNESS:   Bryan [name] is a wonderful 65 y.o. male who has been referred to [place] by Dr. Tammi Klippel for evaluation and management of lung mass. Pt is accompanied today by his wife. The pt reports that he is doing well overall.   The pt reports that he was diagnosed with low-grade Prostate Cancer in 2013. Pt had no symptoms, but got a check up after his father was diagnosed with Prostate Cancer. He was only treated with cryoablation. He also has a reducible, right inguinal hernia. Pt denies any other medical conditions or chronic medications. He does take a daily fish oil and multivitamin. He has NKDA.   About 6 months ago pt began having abdominal pain after eating. He saw several physicians who could not explain his symptoms. Last month he began having extreme discomfort in his chest and red stools. He is unsure if the color of his stools was from blood or diet. Pt then began experiencing left arm pain and pleuritic chest pain, which triggered his Chest CT in October.  In the last few nights pt has had roaming headaches that are improved with OTC Tylenol. Pt has lost 4-5 pounds recently, but attributes this to retiring and running more. Pt is currently taking two Oxycodone twice per day, which is controlling his discomfort. His wife also notes that the pt appears more fatigued than usual and is napping more. Pt felt poorly for 1 week after his second COVID19 vaccine, which he recived in March. Pt received the [place] vaccine, but waiting to receive the booster.   Of note prior to the patient's visit today, pt has had PET/CT ([id number]) completed on 04/08/2020 with results revealing "1. Intensely hypermetabolic bilateral neck, bilateral axillary, bilateral mediastinal,  bilateral hilar and retroperitoneal lymphadenopathy. Numerous small hypermetabolic splenic masses. Findings are compatible with malignancy, favoring lymphoproliferative disorder. The most accessible pathologic lymph nodes for potential percutaneous biopsy are likely within the left axilla and left supraclavicular neck."   Most recent lab results (03/31/2020) of CBC is as follows: all values are WNL except for HCT at 41.2, MPV at 9.7, Neutro Rel at 70.3, Immature Gran Rel at 0.4, Lymphs Rel at 15.8, Mono Rel at 10.3. 03/31/2020 D-Dimer at 0.51  On review of systems, pt reports headaches, chest pain, improved diarrhea, fatigue, mild productive cough, abdominal fullness and denies fevers, chills, night sweats, unexpected weight loss, low appetite, SOB, testicular pain/swelling, leg swelling, rash and any other symptoms.   On PMHx the pt reports Prostate Cancer, Cryotherapy, Inguinal hernia. 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 On Family Hx the pt reports a father with Prostate Cancer and a mother with Esophageal and Bladder Cancer (was a smoker).   INTERVAL HISTORY:  Bryan [name] is a wonderful 65 y.o. male who is here for evaluation and management of Large B-cell lymphoma. We are joined today by his wife. The patient's last visit with [place] was on 09/15/2020. The pt reports that he is doing well overall.  The pt reports that he has been doing well and slowly improving. He still notes some mild fatigue. He is able bike a lot, but notes that heavy running and swimming makes him slightly nauseous for the day after. He is enjoying being at the  beach and notes this is helping him as well.  Lab results today 12/15/2020 of CBC w/diff and CMP is as follows: all values are WNL except for WBC of 3.7K, Glucose of 112, Total protein of 6.3. 12/15/2020 LDH of 165.  On review of systems, pt reports continued mild fatigue and denies difficulty sleeping, changes in breathing, SOB, chest  pain, abdominal pain, back pain, leg swelling, decreased appetite, neuropathy, and any other symptoms.   MEDICAL HISTORY:  Past Medical History:  Diagnosis Date   Prostate cancer ([place])     SURGICAL HISTORY: Past Surgical History:  Procedure Laterality Date   CRYOTHERAPY     IR IMAGING GUIDED PORT INSERTION  05/08/2020   IR REMOVAL TUN ACCESS W/ PORT W/O FL MOD SED  10/14/2020   PROSTATE SURGERY      SOCIAL HISTORY: Social History   Socioeconomic History   Marital status: Married    Spouse name: 1   Number of children: Not on file   Years of education: Not on file   Highest education level: Not on file  Occupational History   Occupation: Futures trader: [employer]    Comment: medical leave  Tobacco Use   Smoking status: Never   Smokeless tobacco: Never  Vaping Use   Vaping Use: Never used  Substance and Sexual Activity   Alcohol use: Not Currently   Drug use: Never   Sexual activity: Yes  Other Topics Concern   Not on file  Social History Narrative   Not on file   Social Determinants of Health   Financial Resource Strain: Not on file  Food Insecurity: Not on file  Transportation Needs: Not on file  Physical Activity: Not on file  Stress: Not on file  Social Connections: Not on file  Intimate Partner Violence: Not on file    FAMILY HISTORY: Family History  Problem Relation Age of Onset   Bladder Cancer Mother    Esophageal cancer Mother    Prostate cancer Father    Prostate cancer Paternal Uncle     ALLERGIES:  has No Known Allergies.  MEDICATIONS:  Current Outpatient Medications  Medication Sig Dispense Refill   acetaminophen (TYLENOL) 325 MG tablet Take 2 tablets (650 mg total) by mouth every 4 (four) hours as needed for mild pain. (Patient not taking: No sig reported)     Acetaminophen-Codeine 300-30 MG tablet Take 1 tablet by mouth every 6 (six) hours as needed for pain. 30 tablet 0   dexamethasone (DECADRON) 4 MG tablet Take 1 tablet  twice a day for 3 days starting the day after each cycle of chemo (Patient taking differently: Take 4 mg by mouth See admin instructions. Take 1 tablet twice a day for 3 days starting the day after each cycle of chemo) 20 tablet 1   dronabinol (MARINOL) 5 MG capsule Take 1 capsule (5 mg total) by mouth 2 (two) times daily before lunch and supper. (Patient taking differently: Take 5 mg by mouth 2 (two) times daily as needed (appetite).) 60 capsule 0   lidocaine-prilocaine (EMLA) cream Apply 1 application topically as needed. Apply to port site (Patient taking differently: Apply 1 application topically as needed (port access).) 30 g 0   LORazepam (ATIVAN) 0.5 MG tablet Take 1 tablet (0.5 mg total) by mouth every 8 (eight) hours as needed for anxiety. 30 tablet 0   ondansetron (ZOFRAN) 8 MG tablet TAKE 1 TABLET BY MOUTH EVERY 8 HOURS AS NEEDED FOR NAUSEA OR VOMITING. (  Patient not taking: Reported on 08/04/2020) 30 tablet 0   pegfilgrastim-bmez (ZIEXTENZO) 6 MG/0.6ML injection Inject 0.6 mLs (6 mg total) into the skin See admin instructions. Inject 0.6 mLs (6 mg total) into the skin once for 1 dose. 24 hours after each cycle of EPOCH. To be administered by Home Health RN arranged by [facility]ject 6 mg into the skin See admin instructions. 0.6 mL 4   polyethylene glycol (MIRALAX / GLYCOLAX) 17 g packet Take 17 g by mouth daily as needed. (Patient taking differently: Take 17 g by mouth daily.) 14 each 0   prochlorperazine (COMPAZINE) 10 MG tablet TAKE 1 TABLET BY MOUTH EVERY 6 HOURS AS NEEDED FOR NAUSEA OR VOMITING. (Patient taking differently: Take 10 mg by mouth every 6 (six) hours as needed for nausea or vomiting.) 30 tablet 2   senna-docusate (SENOKOT-S) 8.6-50 MG tablet Take 1 tablet by mouth 2 (two) times daily as needed for mild constipation.     Sodium Chloride-Sodium Bicarb (SODIUM BICARBONATE/SODIUM CHLORIDE) SOLN 1 application by Mouth Rinse route 4 (four) times daily.     No current  facility-administered medications for this visit.    REVIEW OF SYSTEMS:   10 Point review of Systems was done is negative except as noted above.  PHYSICAL EXAMINATION: ECOG PERFORMANCE STATUS: 1 - Symptomatic but completely ambulatory  . Vitals:   12/15/20 1059  BP: 97/63  Pulse: (!) 57  Resp: 18  Temp: 98.2 F (36.8 C)  SpO2: 100%    Filed Weights   12/15/20 1059  Weight: 135 lb 3.2 oz (61.3 kg)    .Body mass index is 20.56 kg/m.   NAD. GENERAL:alert, in no acute distress and comfortable SKIN: no acute rashes, no significant lesions EYES: conjunctiva are pink and non-injected, sclera anicteric OROPHARYNX: MMM, no exudates, no oropharyngeal erythema or ulceration NECK: supple, no JVD LYMPH:  no palpable lymphadenopathy in the cervical, axillary or inguinal regions LUNGS: clear to auscultation b/l with normal respiratory effort HEART: regular rate & rhythm ABDOMEN:  normoactive bowel sounds , non tender, not distended. Extremity: no pedal edema PSYCH: alert & oriented x 3 with fluent speech NEURO: no focal motor/sensory deficits   LABORATORY DATA:  I have reviewed the data as listed  . CBC Latest Ref Rng & Units 12/15/2020 10/14/2020 09/15/2020  WBC 4.0 - 10.5 K/uL 3.7(L) 4.5 6.1  Hemoglobin 13.0 - 17.0 g/dL 14.5 14.1 12.6(L)  Hematocrit 39.0 - 52.0 % 42.0 42.7 37.6(L)  Platelets 150 - 400 K/uL 258 251 240   . CMP Latest Ref Rng & Units 12/15/2020 09/15/2020 08/08/2020  Glucose 70 - 99 mg/dL 112(H) 78 92  BUN 8 - 23 mg/dL _0 Creatinine 0.61 - 1.24 mg/dL 0.79 0.73 0.62  Sodium 135 - 145 mmol/L 140 142 137  Potassium 3.5 - 5.1 mmol/L 4.2 4.5 3.4(L)  Chloride 98 - 111 mmol/L 105 106 106  CO2 22 - 32 mmol/L _1 Calcium 8.9 - 10.3 mg/dL 9.3 9.1 8.7(L)  Total Protein 6.5 - 8.1 g/dL 6.3(L) 6.1(L) 5.1(L)  Total Bilirubin 0.3 - 1.2 mg/dL 0.4 0.4 0.9  Alkaline Phos 38 - 126 U/L 67 69 58  AST 15 - 41 U/L _2 ALT 0 - 44 U/L _3 04/10/2020 FISH  Analysis ([id number]):   04/10/2020 Left Cervical Lymph Node Bx ([id number]):    RADIOGRAPHIC STUDIES: I have personally reviewed the radiological images as listed and agreed with the findings in the  report. No results found.   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 Intensely hypermetabolic bilateral neck, bilateral axillary, bilateral mediastinal, bilateral hilar and retroperitoneal lymphadenopathy. Numerous small hypermetabolic splenic masses. Findings are compatible with malignancy, favoring lymphoproliferative disorder." 04/10/2020 Left Cervical Lymph Node Bx ([id number]) revealed "Diffuse large B-cell lymphoma. KI-67 is 80-90%" FISH panel neg for double/triplet hit lymphoma PET/CT 12/22 "1. Marked interval response to therapy with resolution of nearly all areas of increased FDG uptake compared to the previous study. Most areas with residual uptake Deauville category 3. Subcarinal and RIGHT thoracic inlet with uptake slightly greater than liver, technically Deauville category 4 as described. Maximum SUV on today's study is 4.3 in the subcarinal region. 2. No new signs of disease. 3. Diffuse marrow activity presumably related to marrow stimulation. Suggest attention on follow-up. Aortic Atherosclerosis (ICD10-I70.0)."   PLAN: -Discussed pt labwork today, 12/15/2020; blood counts normal, chemistries normal, LDH normal. -Advised pt we can get repeat scans in October for additional f/u scan.The patient wishes to hold off on this for now. -Recommended pt receive the second COVID booster shot as recently approved. Advised pt to wait 4-6 months following first booster shot before getting this. -Recommend pt continue to drink 48-64 oz water daily, eat well, walk 20-30 min daily, and get proper sleep. -Start Vitamin B complex once daily and 06-1998 IU Vitamin D daily again. -Will  see back in 3 months with labs. -wants to hold off on Evusheld for now.   FOLLOW UP: RTC with Dr Irene Limbo with labs in 3 months   The total time spent in the appointment was 25 minutes and more than 50% was on counseling and direct patient cares.   All of the patient's questions were answered with apparent satisfaction. The patient knows to call the clinic with any problems, questions or concerns.    Sullivan Lone MD [place] AAHIVMS [facility] Hematology/Oncology Physician [facility]  (Office):       [phone] (Work cell):  [phone] (Fax):           [phone]  12/15/2020 11:37 AM  I, Reinaldo Raddle, am acting as scribe for Dr. Sullivan Lone, MD.  .I have reviewed the above documentation for accuracy and completeness, and I agree with the above. Brunetta Genera MD

## 2020-12-15 ENCOUNTER — Inpatient Hospital Stay (HOSPITAL_BASED_OUTPATIENT_CLINIC_OR_DEPARTMENT_OTHER): Payer: 59 | Admitting: Hematology

## 2020-12-15 ENCOUNTER — Other Ambulatory Visit: Payer: Self-pay

## 2020-12-15 ENCOUNTER — Inpatient Hospital Stay: Payer: 59 | Attending: Hematology

## 2020-12-15 VITALS — BP 97/63 | HR 57 | Temp 98.2°F | Resp 18 | Wt 135.2 lb

## 2020-12-15 DIAGNOSIS — Z8042 Family history of malignant neoplasm of prostate: Secondary | ICD-10-CM | POA: Insufficient documentation

## 2020-12-15 DIAGNOSIS — Z8546 Personal history of malignant neoplasm of prostate: Secondary | ICD-10-CM | POA: Diagnosis not present

## 2020-12-15 DIAGNOSIS — C8598 Non-Hodgkin lymphoma, unspecified, lymph nodes of multiple sites: Secondary | ICD-10-CM | POA: Insufficient documentation

## 2020-12-15 DIAGNOSIS — C8338 Diffuse large B-cell lymphoma, lymph nodes of multiple sites: Secondary | ICD-10-CM

## 2020-12-15 DIAGNOSIS — Z800 Family history of malignant neoplasm of digestive organs: Secondary | ICD-10-CM | POA: Diagnosis not present

## 2020-12-15 DIAGNOSIS — I700 Atherosclerosis of aorta: Secondary | ICD-10-CM | POA: Insufficient documentation

## 2020-12-15 DIAGNOSIS — Z8052 Family history of malignant neoplasm of bladder: Secondary | ICD-10-CM | POA: Insufficient documentation

## 2020-12-15 DIAGNOSIS — Z95828 Presence of other vascular implants and grafts: Secondary | ICD-10-CM

## 2020-12-15 LAB — CBC WITH DIFFERENTIAL/PLATELET
Abs Immature Granulocytes: 0.01 10*3/uL (ref 0.00–0.07)
Basophils Absolute: 0.1 10*3/uL (ref 0.0–0.1)
Basophils Relative: 2 %
Eosinophils Absolute: 0.1 10*3/uL (ref 0.0–0.5)
Eosinophils Relative: 3 %
HCT: 42 % (ref 39.0–52.0)
Hemoglobin: 14.5 g/dL (ref 13.0–17.0)
Immature Granulocytes: 0 %
Lymphocytes Relative: 18 %
Lymphs Abs: 0.7 10*3/uL (ref 0.7–4.0)
MCH: 29.5 pg (ref 26.0–34.0)
MCHC: 34.5 g/dL (ref 30.0–36.0)
MCV: 85.4 fL (ref 80.0–100.0)
Monocytes Absolute: 0.4 10*3/uL (ref 0.1–1.0)
Monocytes Relative: 10 %
Neutro Abs: 2.4 10*3/uL (ref 1.7–7.7)
Neutrophils Relative %: 67 %
Platelets: 258 10*3/uL (ref 150–400)
RBC: 4.92 MIL/uL (ref 4.22–5.81)
RDW: 12.5 % (ref 11.5–15.5)
WBC: 3.7 10*3/uL — ABNORMAL LOW (ref 4.0–10.5)
nRBC: 0 % (ref 0.0–0.2)

## 2020-12-15 LAB — CMP (CANCER CENTER ONLY)
ALT: 25 U/L (ref 0–44)
AST: 25 U/L (ref 15–41)
Albumin: 3.8 g/dL (ref 3.5–5.0)
Alkaline Phosphatase: 67 U/L (ref 38–126)
Anion gap: 8 (ref 5–15)
BUN: 13 mg/dL (ref 8–23)
CO2: 27 mmol/L (ref 22–32)
Calcium: 9.3 mg/dL (ref 8.9–10.3)
Chloride: 105 mmol/L (ref 98–111)
Creatinine: 0.79 mg/dL (ref 0.61–1.24)
GFR, Estimated: >60 mL/min (ref >60)
Glucose, Bld: 112 mg/dL — ABNORMAL HIGH (ref 70–99)
Potassium: 4.2 mmol/L (ref 3.5–5.1)
Sodium: 140 mmol/L (ref 135–145)
Total Bilirubin: 0.4 mg/dL (ref 0.3–1.2)
Total Protein: 6.3 g/dL — ABNORMAL LOW (ref 6.5–8.1)

## 2020-12-15 LAB — LACTATE DEHYDROGENASE: LDH: 165 U/L (ref 98–192)

## 2020-12-18 ENCOUNTER — Encounter: Payer: Self-pay | Admitting: Hematology

## 2020-12-28 IMAGING — PT NM PET TUM IMG RESTAG (PS) SKULL BASE T - THIGH
7 series · 25 of 25 positions shown · non-contrast
Comparison: 04/08/2020

CLINICAL DATA: Subsequent treatment strategy for diffuse large
B-cell lymphoma.

EXAM:
NUCLEAR MEDICINE PET SKULL BASE TO THIGH
TECHNIQUE: 7.35 mCi F-18 FDG was injected intravenously. Full-ring PET imaging
was performed from the skull base to thigh after the radiotracer. CT
data was obtained and used for attenuation correction and anatomic
localization.
Fasting blood glucose: 85 mg/dl

[Series 3: pet sk_thigh ac · axial · 5.0mm · 4.07mm/px · z∈[-1573,-665]mm · 6 of 228 slices shown]
[im 1/228]
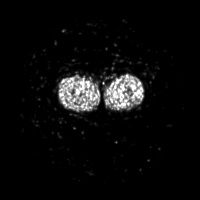
[im 46/228]
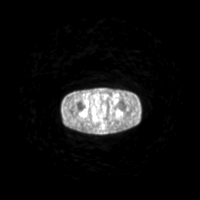
[im 91/228]
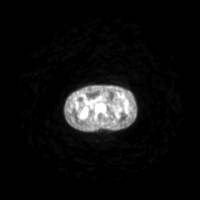
[im 137/228]
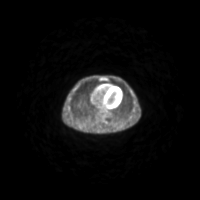
[im 182/228]
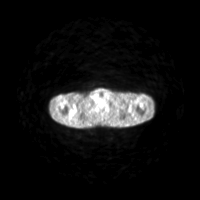
[im 228/228]
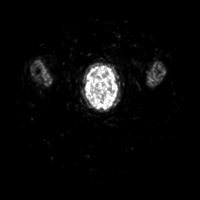

[Series 4: ct sk_thigh 5.0 bf37 · axial · 5.0mm · 0.98mm/px · z∈[-1573,-665]mm · 5 of 228 slices shown]
[im 1/228]
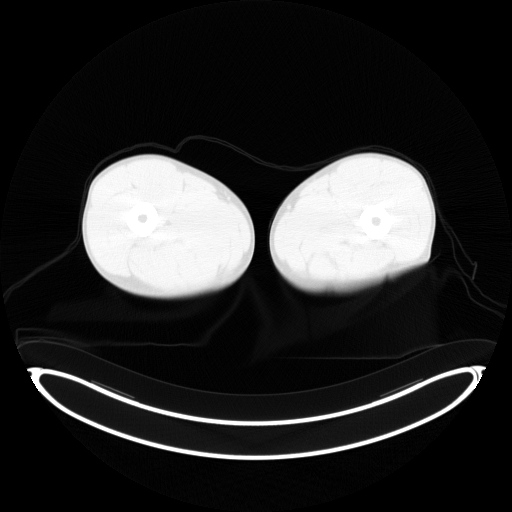
[im 57/228]
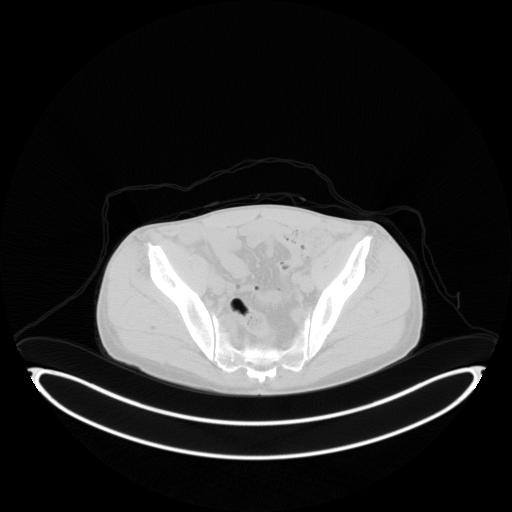
[im 114/228]
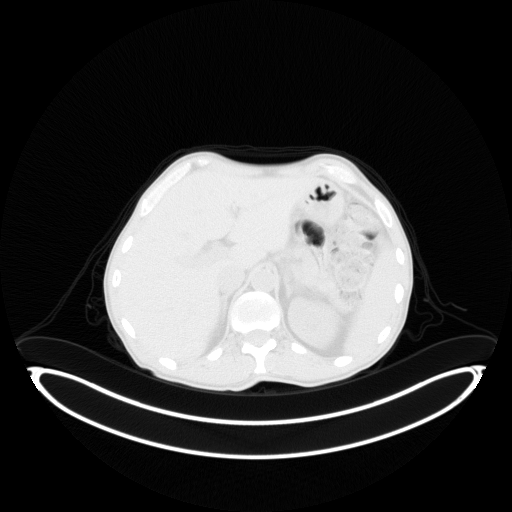
[im 171/228]
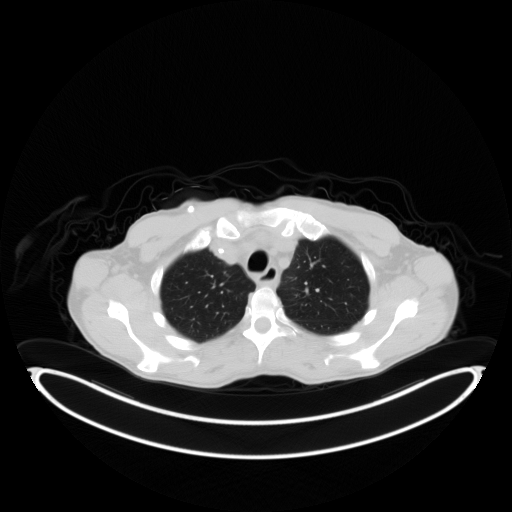
[im 228/228  brain]
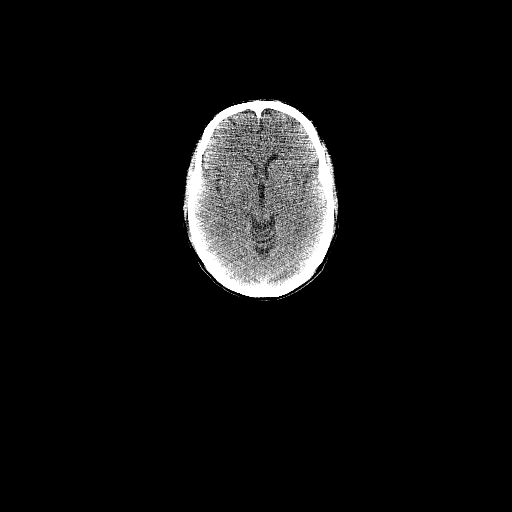

[Series 5: pet sk_thigh nac · axial · 5.0mm · 4.07mm/px · z∈[-1573,-665]mm · 5 of 228 slices shown]
[im 1/228]
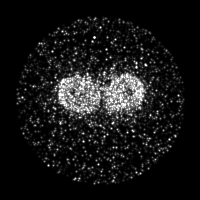
[im 57/228]
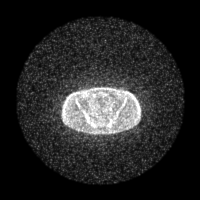
[im 114/228]
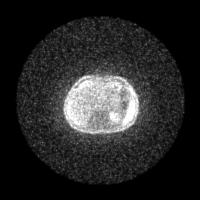
[im 171/228]
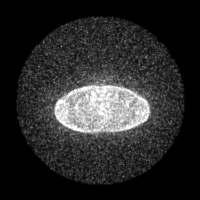
[im 228/228]
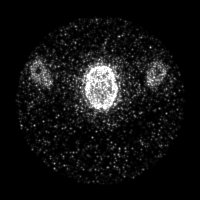

[Series 8: ct sk_thigh 5.0 br59 lung_bone · axial · 5.0mm · 0.63mm/px · z∈[-1090,-830]mm · 2 of 66 slices shown]
[im 1/66]
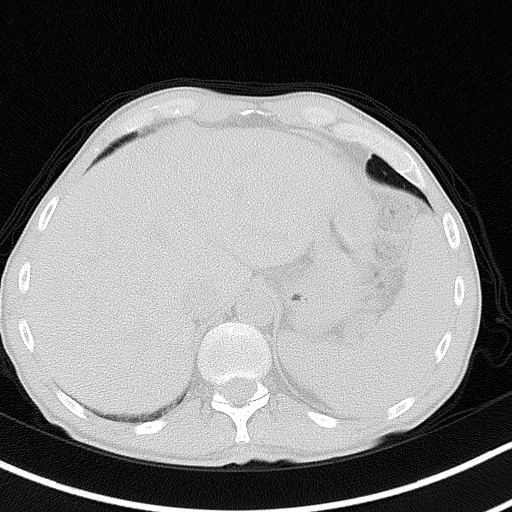
[im 66/66]
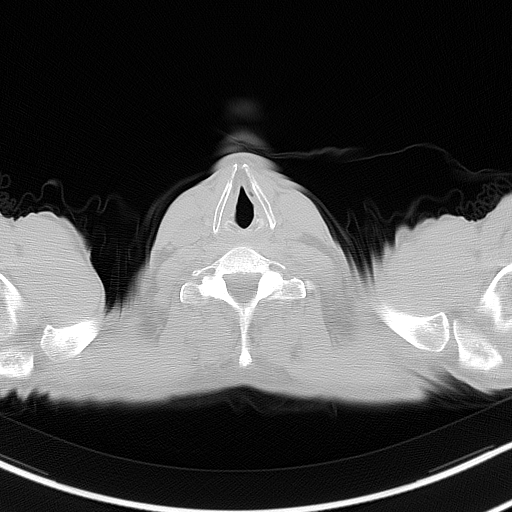

[Series 603: <mip collection> · coronal · 1.88mm/px · 1 of 32 slices shown]
[im 1/32]
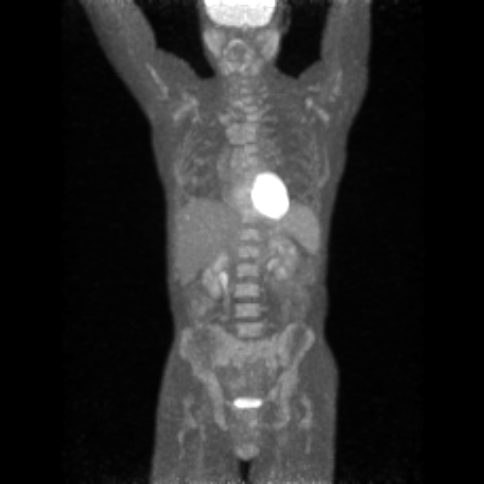

[Series 604: fused cor · 1 of 33 slices shown]
[im 1/33]
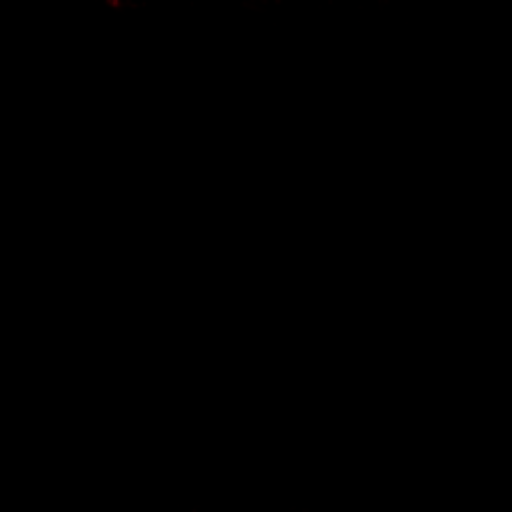

[Series 605: range-ct sk_thigh 5.0 bf37-tra-<alpha range> · 5 of 223 slices shown]
[im 1/223]
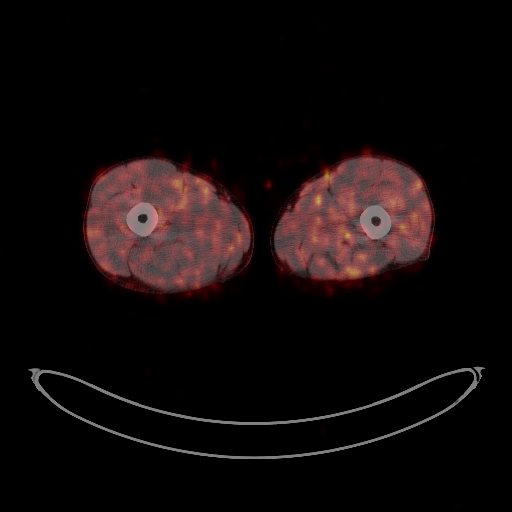
[im 56/223]
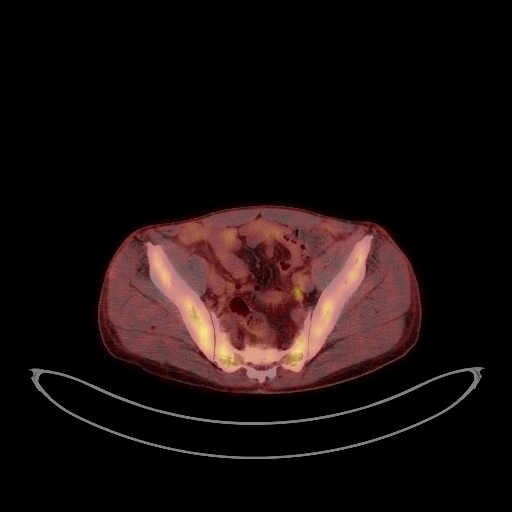
[im 112/223]
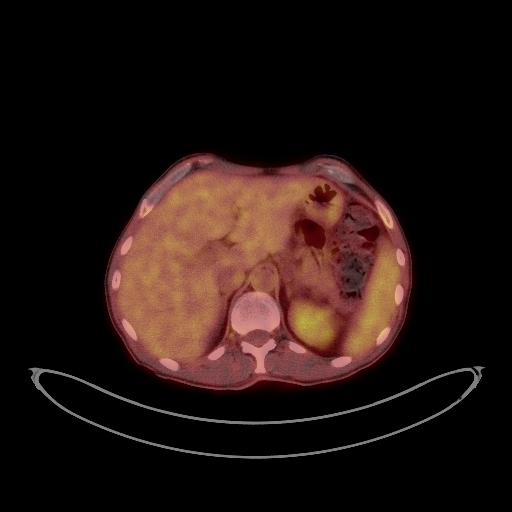
[im 167/223]
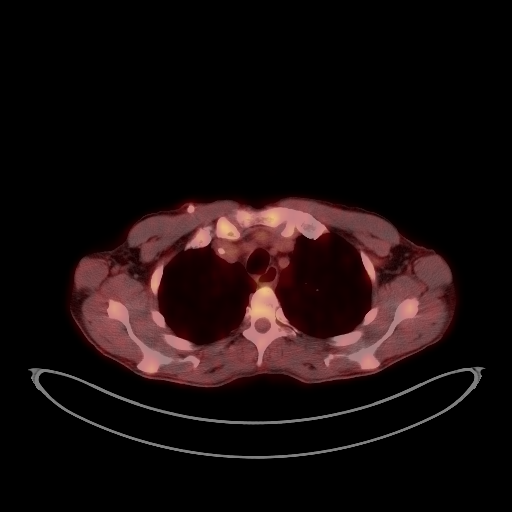
[im 223/223]
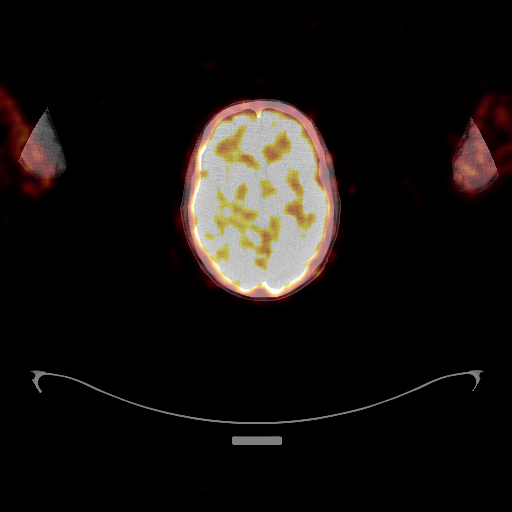

[25 of 25 positions shown; findings below may reference images not displayed]

FINDINGS: Mediastinal blood pool activity: SUV max

Liver activity: SUV max

NECK: Marked interval decrease in the metabolism associated with
lymph nodes in the neck. Level 2 lymph node on the RIGHT previously
0.9 cm now approximately 3 mm and without evidence of significant
FDG uptake.

Representative LEFT level 4 lymph nodes seen on the prior study
previously with a maximum SUV of 22 shows mild enlargement on
today's study at 1 cm as compared to 1.8 cm (image 47, series 4)
current maximum SUV of 1.7.

RIGHT thoracic inlet lymph node measuring 2.2 cm on today's study
shown to be necrotic with some peripheral activity on the prior exam
previously at 3.1 cm. Mild uptake at the periphery with a maximum
SUV of 4.2.

Symmetric bilateral presumed physiologic parotid uptake

Incidental CT findings: none

CHEST: Bulky mediastinal and hilar lymph nodes seen on the previous
exam have largely resolved.

Thoracic inlet lymph node beneath the sternum (image 56, series 4) 9
mm short axis, previously 11 mm short axis maximum SUV of 2.0 as
compared to 21

Subcarinal nodal tissue (image 78 of series 4) measuring 2 cm short
axis, previously 2.8 cm short axis with a maximum SUV of 4.3 as
compared to 22.

RIGHT hilar lymph node (image 82, series 4) 18 mm as compared to 22
mm SUV at 2.5 as compared to 25

Soft tissue along the aorta corresponding to LEFT infrahilar nodal
tissue measuring 1 cm short axis dimension on image 89 series 4
previously 2.8 cm maximum SUV of 2.9 as compared to as much as 24 on
the prior study

Resolution of activity associated with a mildly enlarged LEFT
axillary lymph node that was seen on prior study previously with
activity up to a maximum SUV of 21.

Subcentimeter lymph node LEFT axilla on image 61 of series 4 with a
SUV of 1.2 on the current study

Incidental CT findings: Scattered aortic atherosclerotic changes.
RIGHT-sided Port-A-Cath terminates in the RIGHT atrium. Heart size
is normal without pericardial effusion. Aortic caliber is. Central
pulmonary vascular caliber normal. Limited assessment of
cardiovascular structures given lack of intravenous contrast.
Minimal basilar atelectasis. Airways are patent.

ABDOMEN/PELVIS: Focal areas of uptake in the spleen resolved.
Splenic activity remains slightly above background liver activity.
Splenic size is stable, unenlarged. No hypermetabolic lymph nodes in
the abdomen or pelvis.

Increased metabolism within lymph nodes in the upper abdomen seen on
the previous study has resolved. There is decreased size of a
hepatogastric lymph node, now measuring 1.4 cm as compared to
cm. (Image 113, series 4) max SUV of 2.5.

Incidental CT findings: Liver, pancreas gallbladder, adrenal glands,
kidneys stomach, small large bowel without acute process. Stool
throughout the colon. Calcified atheromatous plaque in the abdominal
aorta.

SKELETON: Diffuse increased metabolism throughout marrow spaces of
the spine and pelvis in particular. No focal uptake.

Incidental CT findings: none
IMPRESSION: 1. Marked interval response to therapy with resolution of nearly all
areas of increased FDG uptake compared to the previous study. Most
areas with residual uptake [HOSPITAL] category 3. Subcarinal and
RIGHT thoracic inlet with uptake slightly greater than liver,
technically [HOSPITAL] category 4 as described. Maximum SUV on
today's study is 4.3 in the subcarinal region.
2. No new signs of disease.
3. Diffuse marrow activity presumably related to marrow stimulation.
Suggest attention on follow-up.

Aortic Atherosclerosis ([id number]).

## 2021-03-22 ENCOUNTER — Encounter: Payer: Self-pay | Admitting: Hematology

## 2021-03-23 ENCOUNTER — Telehealth: Payer: Self-pay | Admitting: Hematology

## 2021-03-23 ENCOUNTER — Inpatient Hospital Stay: Payer: 59

## 2021-03-23 ENCOUNTER — Inpatient Hospital Stay: Payer: 59 | Admitting: Hematology

## 2021-03-23 NOTE — Telephone Encounter (Signed)
Scheduled per sch msg. Called and left msg with new date and time

## 2021-04-08 ENCOUNTER — Other Ambulatory Visit: Payer: Self-pay

## 2021-04-08 ENCOUNTER — Inpatient Hospital Stay: Payer: 59

## 2021-04-08 ENCOUNTER — Inpatient Hospital Stay: Payer: 59 | Attending: Hematology | Admitting: Hematology

## 2021-04-08 VITALS — BP 100/77 | HR 54 | Temp 97.2°F | Resp 18 | Wt 133.9 lb

## 2021-04-08 DIAGNOSIS — C8338 Diffuse large B-cell lymphoma, lymph nodes of multiple sites: Secondary | ICD-10-CM | POA: Diagnosis not present

## 2021-04-08 DIAGNOSIS — Z8572 Personal history of non-Hodgkin lymphomas: Secondary | ICD-10-CM | POA: Insufficient documentation

## 2021-04-08 LAB — CMP (CANCER CENTER ONLY)
ALT: 39 U/L (ref 0–44)
AST: 29 U/L (ref 15–41)
Albumin: 4.2 g/dL (ref 3.5–5.0)
Alkaline Phosphatase: 66 U/L (ref 38–126)
Anion gap: 9 (ref 5–15)
BUN: 9 mg/dL (ref 8–23)
CO2: 25 mmol/L (ref 22–32)
Calcium: 9 mg/dL (ref 8.9–10.3)
Chloride: 106 mmol/L (ref 98–111)
Creatinine: 0.81 mg/dL (ref 0.61–1.24)
GFR, Estimated: >60 mL/min (ref >60)
Glucose, Bld: 83 mg/dL (ref 70–99)
Potassium: 3.7 mmol/L (ref 3.5–5.1)
Sodium: 140 mmol/L (ref 135–145)
Total Bilirubin: 0.5 mg/dL (ref 0.3–1.2)
Total Protein: 6.6 g/dL (ref 6.5–8.1)

## 2021-04-08 LAB — CBC WITH DIFFERENTIAL (CANCER CENTER ONLY)
Abs Immature Granulocytes: 0.02 10*3/uL (ref 0.00–0.07)
Basophils Absolute: 0.1 10*3/uL (ref 0.0–0.1)
Basophils Relative: 1 %
Eosinophils Absolute: 0.2 10*3/uL (ref 0.0–0.5)
Eosinophils Relative: 3 %
HCT: 40.4 % (ref 39.0–52.0)
Hemoglobin: 14.1 g/dL (ref 13.0–17.0)
Immature Granulocytes: 0 %
Lymphocytes Relative: 16 %
Lymphs Abs: 0.9 10*3/uL (ref 0.7–4.0)
MCH: 31.2 pg (ref 26.0–34.0)
MCHC: 34.9 g/dL (ref 30.0–36.0)
MCV: 89.4 fL (ref 80.0–100.0)
Monocytes Absolute: 0.6 10*3/uL (ref 0.1–1.0)
Monocytes Relative: 9 %
Neutro Abs: 4.1 10*3/uL (ref 1.7–7.7)
Neutrophils Relative %: 71 %
Platelet Count: 289 10*3/uL (ref 150–400)
RBC: 4.52 MIL/uL (ref 4.22–5.81)
RDW: 12.5 % (ref 11.5–15.5)
WBC Count: 5.9 10*3/uL (ref 4.0–10.5)
nRBC: 0 % (ref 0.0–0.2)

## 2021-04-08 LAB — LACTATE DEHYDROGENASE: LDH: 145 U/L (ref 98–192)

## 2021-04-14 ENCOUNTER — Encounter: Payer: Self-pay | Admitting: Hematology

## 2021-04-14 NOTE — Progress Notes (Addendum)
HEMATOLOGY/ONCOLOGY CLINIC NOTE  Date of Service: .04/08/2021   Patient Care Team: System, Provider Not In as PCP - General  CHIEF COMPLAINTS/PURPOSE OF CONSULTATION:  mx of large B cell lymphoma  HISTORY OF PRESENTING ILLNESS:   Bryan [name] is a wonderful 65 y.o. male who has been referred to [place] by Dr. Tammi Klippel for evaluation and management of lung mass. Pt is accompanied today by his wife. The pt reports that he is doing well overall.   The pt reports that he was diagnosed with low-grade Prostate Cancer in 2013. Pt had no symptoms, but got a check up after his father was diagnosed with Prostate Cancer. He was only treated with cryoablation. He also has a reducible, right inguinal hernia. Pt denies any other medical conditions or chronic medications. He does take a daily fish oil and multivitamin. He has NKDA.   About 6 months ago pt began having abdominal pain after eating. He saw several physicians who could not explain his symptoms. Last month he began having extreme discomfort in his chest and red stools. He is unsure if the color of his stools was from blood or diet. Pt then began experiencing left arm pain and pleuritic chest pain, which triggered his Chest CT in October.  In the last few nights pt has had roaming headaches that are improved with OTC Tylenol. Pt has lost 4-5 pounds recently, but attributes this to retiring and running more. Pt is currently taking two Oxycodone twice per day, which is controlling his discomfort. His wife also notes that the pt appears more fatigued than usual and is napping more. Pt felt poorly for 1 week after his second COVID19 vaccine, which he recived in March. Pt received the [place] vaccine, but waiting to receive the booster.   Of note prior to the patient's visit today, pt has had PET/CT ([id number]) completed on 04/08/2020 with results revealing "1. Intensely hypermetabolic bilateral neck, bilateral axillary, bilateral mediastinal,  bilateral hilar and retroperitoneal lymphadenopathy. Numerous small hypermetabolic splenic masses. Findings are compatible with malignancy, favoring lymphoproliferative disorder. The most accessible pathologic lymph nodes for potential percutaneous biopsy are likely within the left axilla and left supraclavicular neck."   Most recent lab results (03/31/2020) of CBC is as follows: all values are WNL except for HCT at 41.2, MPV at 9.7, Neutro Rel at 70.3, Immature Gran Rel at 0.4, Lymphs Rel at 15.8, Mono Rel at 10.3. 03/31/2020 D-Dimer at 0.51  On review of systems, pt reports headaches, chest pain, improved diarrhea, fatigue, mild productive cough, abdominal fullness and denies fevers, chills, night sweats, unexpected weight loss, low appetite, SOB, testicular pain/swelling, leg swelling, rash and any other symptoms.   On PMHx the pt reports Prostate Cancer, Cryotherapy, Inguinal hernia. 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 On Family Hx the pt reports a father with Prostate Cancer and a mother with Esophageal and Bladder Cancer (was a smoker).   INTERVAL HISTORY:  Bryan [name] is a wonderful 65 y.o. male who is here for evaluation and management of Large B-cell lymphoma. We are joined today by his wife. The patient's last visit with [place] was on 12/15/2020.   The pt reports that he he did have a COVID infection a few months ago when he had traveled to [place] [facility].  He notes he was treated locally and recovered from this.  He unfortunately also picked up a bad upper respiratory and RSV infection recently for his brother who  was visiting him.  He notes that this knocked him back significantly with significant fatigue and respiratory symptoms which have now more or less resolved with supportive care and antibiotics. Currently notes no fevers chills night sweats. Notes that his appetite has been starting to come back and he is trying to eat better. Still notes some fatigue  and the fact that he is not completely back to his baseline.  However he has been trying to stay active fix things around the house and is trying to get more physically active. He notes he sets high standards for himself since he has been a marathon runner.  Labs done today 04/08/2021 show normal CBC, CMP within normal limits and LDH within normal limits at 145.  No chest pain or shortness of breath.  No abdominal pain or distention.  No new palpable lumps or bumps.  MEDICAL HISTORY:  Past Medical History:  Diagnosis Date   Prostate cancer ([place])     SURGICAL HISTORY: Past Surgical History:  Procedure Laterality Date   CRYOTHERAPY     IR IMAGING GUIDED PORT INSERTION  05/08/2020   IR REMOVAL TUN ACCESS W/ PORT W/O FL MOD SED  10/14/2020   PROSTATE SURGERY      SOCIAL HISTORY: Social History   Socioeconomic History   Marital status: Married    Spouse name: 1   Number of children: Not on file   Years of education: Not on file   Highest education level: Not on file  Occupational History   Occupation: Futures trader: [employer]    Comment: medical leave  Tobacco Use   Smoking status: Never   Smokeless tobacco: Never  Vaping Use   Vaping Use: Never used  Substance and Sexual Activity   Alcohol use: Not Currently   Drug use: Never   Sexual activity: Yes  Other Topics Concern   Not on file  Social History Narrative   Not on file   Social Determinants of Health   Financial Resource Strain: Not on file  Food Insecurity: Not on file  Transportation Needs: Not on file  Physical Activity: Not on file  Stress: Not on file  Social Connections: Not on file  Intimate Partner Violence: Not on file    FAMILY HISTORY: Family History  Problem Relation Age of Onset   Bladder Cancer Mother    Esophageal cancer Mother    Prostate cancer Father    Prostate cancer Paternal Uncle     ALLERGIES:  has No Known Allergies.  MEDICATIONS:  Current Outpatient Medications   Medication Sig Dispense Refill   acetaminophen (TYLENOL) 325 MG tablet Take 2 tablets (650 mg total) by mouth every 4 (four) hours as needed for mild pain. (Patient not taking: No sig reported)     Acetaminophen-Codeine 300-30 MG tablet Take 1 tablet by mouth every 6 (six) hours as needed for pain. 30 tablet 0   dexamethasone (DECADRON) 4 MG tablet Take 1 tablet twice a day for 3 days starting the day after each cycle of chemo (Patient taking differently: Take 4 mg by mouth See admin instructions. Take 1 tablet twice a day for 3 days starting the day after each cycle of chemo) 20 tablet 1   dronabinol (MARINOL) 5 MG capsule Take 1 capsule (5 mg total) by mouth 2 (two) times daily before lunch and supper. (Patient taking differently: Take 5 mg by mouth 2 (two) times daily as needed (appetite).) 60 capsule 0   lidocaine-prilocaine (EMLA) cream  Apply 1 application topically as needed. Apply to port site (Patient taking differently: Apply 1 application topically as needed (port access).) 30 g 0   LORazepam (ATIVAN) 0.5 MG tablet Take 1 tablet (0.5 mg total) by mouth every 8 (eight) hours as needed for anxiety. 30 tablet 0   ondansetron (ZOFRAN) 8 MG tablet TAKE 1 TABLET BY MOUTH EVERY 8 HOURS AS NEEDED FOR NAUSEA OR VOMITING. (Patient not taking: Reported on 08/04/2020) 30 tablet 0   pegfilgrastim-bmez (ZIEXTENZO) 6 MG/0.6ML injection Inject 0.6 mLs (6 mg total) into the skin See admin instructions. Inject 0.6 mLs (6 mg total) into the skin once for 1 dose. 24 hours after each cycle of EPOCH. To be administered by Home Health RN arranged by [facility]oInject 6 mg into the skin See admin instructions. 0.6 mL 4   polyethylene glycol (MIRALAX / GLYCOLAX) 17 g packet Take 17 g by mouth daily as needed. (Patient taking differently: Take 17 g by mouth daily.) 14 each 0   prochlorperazine (COMPAZINE) 10 MG tablet TAKE 1 TABLET BY MOUTH EVERY 6 HOURS AS NEEDED FOR NAUSEA OR VOMITING. (Patient taking differently: Take  10 mg by mouth every 6 (six) hours as needed for nausea or vomiting.) 30 tablet 2   senna-docusate (SENOKOT-S) 8.6-50 MG tablet Take 1 tablet by mouth 2 (two) times daily as needed for mild constipation.     Sodium Chloride-Sodium Bicarb (SODIUM BICARBONATE/SODIUM CHLORIDE) SOLN 1 application by Mouth Rinse route 4 (four) times daily.     No current facility-administered medications for this visit.    REVIEW OF SYSTEMS:   .10 Point review of Systems was done is negative except as noted above.    PHYSICAL EXAMINATION: ECOG PERFORMANCE STATUS: 1 - Symptomatic but completely ambulatory  . Vitals:   04/08/21 1015  BP: 100/77  Pulse: (!) 54  Resp: 18  Temp: (!) 97.2 F (36.2 C)  SpO2: 100%    Filed Weights   04/08/21 1015  Weight: 133 lb 14.4 oz (60.7 kg)    .Body mass index is 20.36 kg/m.  [name] GENERAL:alert, in no acute distress and comfortable SKIN: no acute rashes, no significant lesions EYES: conjunctiva are pink and non-injected, sclera anicteric OROPHARYNX: MMM, no exudates, no oropharyngeal erythema or ulceration NECK: supple, no JVD LYMPH:  no palpable lymphadenopathy in the cervical, axillary or inguinal regions LUNGS: clear to auscultation b/l with normal respiratory effort HEART: regular rate & rhythm ABDOMEN:  normoactive bowel sounds , non tender, not distended. Extremity: no pedal edema PSYCH: alert & oriented x 3 with fluent speech NEURO: no focal motor/sensory deficits   LABORATORY DATA:  I have reviewed the data as listed  . CBC Latest Ref Rng & Units 04/08/2021 12/15/2020 10/14/2020  WBC 4.0 - 10.5 K/uL 5.9 3.7(L) 4.5  Hemoglobin 13.0 - 17.0 g/dL 14.1 14.5 14.1  Hematocrit 39.0 - 52.0 % 40.4 42.0 42.7  Platelets 150 - 400 K/uL 289 258 251   . CMP Latest Ref Rng & Units 04/08/2021 12/15/2020 09/15/2020  Glucose 70 - 99 mg/dL 83 112(H) 78  BUN 8 - 23 mg/dL _0 Creatinine 0.61 - 1.24 mg/dL 0.81 0.79 0.73  Sodium 135 - 145 mmol/L 140 140 142   Potassium 3.5 - 5.1 mmol/L 3.7 4.2 4.5  Chloride 98 - 111 mmol/L 106 105 106  CO2 22 - 32 mmol/L _1 Calcium 8.9 - 10.3 mg/dL 9.0 9.3 9.1  Total Protein 6.5 - 8.1 g/dL 6.6 6.3(L) 6.1(L)  Total Bilirubin 0.3 - 1.2 mg/dL 0.5 0.4 0.4  Alkaline Phos 38 - 126 U/L 66 67 69  AST 15 - 41 U/L _0 ALT 0 - 44 U/L 39 25 26   04/10/2020 FISH Analysis ([id number]):   04/10/2020 Left Cervical Lymph Node Bx ([id number]):    RADIOGRAPHIC STUDIES: I have personally reviewed the radiological images as listed and agreed with the findings in the report. No results found.   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 Intensely hypermetabolic bilateral neck, bilateral axillary, bilateral mediastinal, bilateral hilar and retroperitoneal lymphadenopathy. Numerous small hypermetabolic splenic masses. Findings are compatible with malignancy, favoring lymphoproliferative disorder." 04/10/2020 Left Cervical Lymph Node Bx ([id number]) revealed "Diffuse large B-cell lymphoma. KI-67 is 80-90%" FISH panel neg for double/triplet hit lymphoma PET/CT 12/22 "1. Marked interval response to therapy with resolution of nearly all areas of increased FDG uptake compared to the previous study. Most areas with residual uptake Deauville category 3. Subcarinal and RIGHT thoracic inlet with uptake slightly greater than liver, technically Deauville category 4 as described. Maximum SUV on today's study is 4.3 in the subcarinal region. 2. No new signs of disease. 3. Diffuse marrow activity presumably related to marrow stimulation. Suggest attention on follow-up. Aortic Atherosclerosis (ICD10-I70.0)." S/p 6 cycles of EPOCH-R  2) Recent COVID 19 and RSV infections -- resolved PLAN: -Discussed pt labwork today, 04/08/2021; blood counts normal, chemistries normal, LDH normal. -recommended he get his pfizer bivalent COVID 19 booster  vaccine 2 month post covid 19 infection. -discussed infection prevention strategies. -Recommend pt continue to drink 48-64 oz water daily, eat well, walk 20-30 min daily, and get proper sleep. -continue  Vitamin B complex once daily and 06-1998 IU Vitamin D daily again. -Will see back in 3 months with labs.   FOLLOW UP: RTC with Dr Irene Limbo with labs in 3 months  The total time spent in the appointment was 30 minutes and more than 50% was on counseling and direct patient cares.  All of the patient's questions were answered with apparent satisfaction. The patient knows to call the clinic with any problems, questions or concerns.    Sullivan Lone MD [place] AAHIVMS [facility] Hematology/Oncology Physician [facility]

## 2021-06-22 ENCOUNTER — Encounter: Payer: Self-pay | Admitting: Hematology

## 2021-06-25 ENCOUNTER — Encounter: Payer: Self-pay | Admitting: Hematology

## 2021-06-25 ENCOUNTER — Telehealth: Payer: Self-pay

## 2021-06-25 NOTE — Telephone Encounter (Signed)
Contacted wife regarding MyChart message. Per Dr Irene Limbo no need  for scan at this time prior to his appointment. Since they are not local encouraged wife to have PCP evaluate if needed. Instructed to call if lymph nodes get bigger and we will see pt sooner if needed. Wife verbalized understanding.

## 2021-07-10 ENCOUNTER — Other Ambulatory Visit: Payer: Self-pay

## 2021-07-10 ENCOUNTER — Inpatient Hospital Stay (HOSPITAL_BASED_OUTPATIENT_CLINIC_OR_DEPARTMENT_OTHER): Payer: No Typology Code available for payment source | Admitting: Hematology

## 2021-07-10 ENCOUNTER — Inpatient Hospital Stay: Payer: No Typology Code available for payment source | Attending: Hematology

## 2021-07-10 VITALS — BP 115/80 | HR 66 | Temp 97.7°F | Resp 66 | Wt 137.4 lb

## 2021-07-10 DIAGNOSIS — Z8572 Personal history of non-Hodgkin lymphomas: Secondary | ICD-10-CM | POA: Insufficient documentation

## 2021-07-10 DIAGNOSIS — C8338 Diffuse large B-cell lymphoma, lymph nodes of multiple sites: Secondary | ICD-10-CM | POA: Diagnosis not present

## 2021-07-10 DIAGNOSIS — R5383 Other fatigue: Secondary | ICD-10-CM | POA: Diagnosis not present

## 2021-07-10 LAB — CBC WITH DIFFERENTIAL/PLATELET
Abs Immature Granulocytes: 0.01 10*3/uL (ref 0.00–0.07)
Basophils Absolute: 0.1 10*3/uL (ref 0.0–0.1)
Basophils Relative: 2 %
Eosinophils Absolute: 0.2 10*3/uL (ref 0.0–0.5)
Eosinophils Relative: 5 %
HCT: 44 % (ref 39.0–52.0)
Hemoglobin: 15.1 g/dL (ref 13.0–17.0)
Immature Granulocytes: 0 %
Lymphocytes Relative: 30 %
Lymphs Abs: 1.3 10*3/uL (ref 0.7–4.0)
MCH: 30.8 pg (ref 26.0–34.0)
MCHC: 34.3 g/dL (ref 30.0–36.0)
MCV: 89.6 fL (ref 80.0–100.0)
Monocytes Absolute: 0.4 10*3/uL (ref 0.1–1.0)
Monocytes Relative: 8 %
Neutro Abs: 2.4 10*3/uL (ref 1.7–7.7)
Neutrophils Relative %: 55 %
Platelets: 241 10*3/uL (ref 150–400)
RBC: 4.91 MIL/uL (ref 4.22–5.81)
RDW: 12.2 % (ref 11.5–15.5)
WBC: 4.4 10*3/uL (ref 4.0–10.5)
nRBC: 0 % (ref 0.0–0.2)

## 2021-07-10 LAB — CMP (CANCER CENTER ONLY)
ALT: 21 U/L (ref 0–44)
AST: 18 U/L (ref 15–41)
Albumin: 4.7 g/dL (ref 3.5–5.0)
Alkaline Phosphatase: 65 U/L (ref 38–126)
Anion gap: 7 (ref 5–15)
BUN: 14 mg/dL (ref 8–23)
CO2: 29 mmol/L (ref 22–32)
Calcium: 9.3 mg/dL (ref 8.9–10.3)
Chloride: 104 mmol/L (ref 98–111)
Creatinine: 0.78 mg/dL (ref 0.61–1.24)
GFR, Estimated: >60 mL/min (ref >60)
Glucose, Bld: 114 mg/dL — ABNORMAL HIGH (ref 70–99)
Potassium: 3.7 mmol/L (ref 3.5–5.1)
Sodium: 140 mmol/L (ref 135–145)
Total Bilirubin: 0.5 mg/dL (ref 0.3–1.2)
Total Protein: 7 g/dL (ref 6.5–8.1)

## 2021-07-10 LAB — LACTATE DEHYDROGENASE: LDH: 122 U/L (ref 98–192)

## 2021-07-10 NOTE — Progress Notes (Addendum)
HEMATOLOGY/ONCOLOGY CLINIC NOTE  Date of Service: .07/10/2021   Patient Care Team: System, Provider Not In as PCP - General  CHIEF COMPLAINTS/PURPOSE OF CONSULTATION:  follow-up for continued evaluation and management of large B-cell lymphoma  HISTORY OF PRESENTING ILLNESS:  See previous note for details on initial presentation  INTERVAL HISTORY:  Bryan [name] is a wonderful 66 y.o. male who is here for continued evaluation and management of large B-cell lymphoma . He notes intermittent significant fatigue especially since he recovered from his last RSV infection. He notes that his recurrent viral infections have knocked him back with regards to his functional status. He does note that he carries some chronic fatigue which at times comes in the way of his daily activities. No focal new symptoms.   Labs done today 04/08/2021 show normal CBC, CMP within normal limits and LDH within normal limits at 145.  No chest pain or shortness of breath.  No abdominal pain or distention.  No new palpable lumps or bumps. Has been trying to increase his physical activity. Notes the cold has limited some of his outside activities. We discussed his fatigue in the context of multiple possible considerations.  Labs done today show normal CBC with a hemoglobin of 15.1 WBC count of 4.4k and platelets of 241k CMP within normal limits LDH 122   MEDICAL HISTORY:  Past Medical History:  Diagnosis Date   Prostate cancer ([place])     SURGICAL HISTORY: Past Surgical History:  Procedure Laterality Date   CRYOTHERAPY     IR IMAGING GUIDED PORT INSERTION  05/08/2020   IR REMOVAL TUN ACCESS W/ PORT W/O FL MOD SED  10/14/2020   PROSTATE SURGERY      SOCIAL HISTORY: Social History   Socioeconomic History   Marital status: Married    Spouse name: 1   Number of children: Not on file   Years of education: Not on file   Highest education level: Not on file  Occupational History   Occupation:  Futures trader: [employer]    Comment: medical leave  Tobacco Use   Smoking status: Never   Smokeless tobacco: Never  Vaping Use   Vaping Use: Never used  Substance and Sexual Activity   Alcohol use: Not Currently   Drug use: Never   Sexual activity: Yes  Other Topics Concern   Not on file  Social History Narrative   Not on file   Social Determinants of Health   Financial Resource Strain: Not on file  Food Insecurity: Not on file  Transportation Needs: Not on file  Physical Activity: Not on file  Stress: Not on file  Social Connections: Not on file  Intimate Partner Violence: Not on file    FAMILY HISTORY: Family History  Problem Relation Age of Onset   Bladder Cancer Mother    Esophageal cancer Mother    Prostate cancer Father    Prostate cancer Paternal Uncle     ALLERGIES:  has No Known Allergies.  MEDICATIONS:  Current Outpatient Medications  Medication Sig Dispense Refill   acetaminophen (TYLENOL) 325 MG tablet Take 2 tablets (650 mg total) by mouth every 4 (four) hours as needed for mild pain. (Patient not taking: No sig reported)     Acetaminophen-Codeine 300-30 MG tablet Take 1 tablet by mouth every 6 (six) hours as needed for pain. 30 tablet 0   dexamethasone (DECADRON) 4 MG tablet Take 1 tablet twice a day for 3 days starting the day  after each cycle of chemo (Patient taking differently: Take 4 mg by mouth See admin instructions. Take 1 tablet twice a day for 3 days starting the day after each cycle of chemo) 20 tablet 1   dronabinol (MARINOL) 5 MG capsule Take 1 capsule (5 mg total) by mouth 2 (two) times daily before lunch and supper. (Patient taking differently: Take 5 mg by mouth 2 (two) times daily as needed (appetite).) 60 capsule 0   lidocaine-prilocaine (EMLA) cream Apply 1 application topically as needed. Apply to port site (Patient taking differently: Apply 1 application topically as needed (port access).) 30 g 0   LORazepam (ATIVAN) 0.5 MG  tablet Take 1 tablet (0.5 mg total) by mouth every 8 (eight) hours as needed for anxiety. 30 tablet 0   ondansetron (ZOFRAN) 8 MG tablet TAKE 1 TABLET BY MOUTH EVERY 8 HOURS AS NEEDED FOR NAUSEA OR VOMITING. (Patient not taking: Reported on 08/04/2020) 30 tablet 0   pegfilgrastim-bmez (ZIEXTENZO) 6 MG/0.6ML injection Inject 0.6 mLs (6 mg total) into the skin See admin instructions. Inject 0.6 mLs (6 mg total) into the skin once for 1 dose. 24 hours after each cycle of EPOCH. To be administered by Home Health RN arranged by [facility]/AccredoInject 6 mg into the skin See admin instructions. 0.6 mL 4   polyethylene glycol (MIRALAX / GLYCOLAX) 17 g packet Take 17 g by mouth daily as needed. (Patient taking differently: Take 17 g by mouth daily.) 14 each 0   prochlorperazine (COMPAZINE) 10 MG tablet TAKE 1 TABLET BY MOUTH EVERY 6 HOURS AS NEEDED FOR NAUSEA OR VOMITING. (Patient taking differently: Take 10 mg by mouth every 6 (six) hours as needed for nausea or vomiting.) 30 tablet 2   senna-docusate (SENOKOT-S) 8.6-50 MG tablet Take 1 tablet by mouth 2 (two) times daily as needed for mild constipation.     Sodium Chloride-Sodium Bicarb (SODIUM BICARBONATE/SODIUM CHLORIDE) SOLN 1 application by Mouth Rinse route 4 (four) times daily.     No current facility-administered medications for this visit.    REVIEW OF SYSTEMS:   10 Point review of Systems was done is negative except as noted above.  PHYSICAL EXAMINATION: ECOG PERFORMANCE STATUS: 1 - Symptomatic but completely ambulatory  . Vitals:   07/10/21 1258  BP: 115/80  Pulse: 66  Resp: (!) 66  Temp: 97.7 F (36.5 C)  SpO2: 100%    Filed Weights   07/10/21 1258  Weight: 137 lb 6.4 oz (62.3 kg)    .Body mass index is 20.89 kg/m.  NAD GENERAL:alert, in no acute distress and comfortable SKIN: no acute rashes, no significant lesions EYES: conjunctiva are pink and non-injected, sclera anicteric OROPHARYNX: MMM, no exudates, no oropharyngeal  erythema or ulceration NECK: supple, no JVD LYMPH:  no palpable lymphadenopathy in the cervical, axillary or inguinal regions LUNGS: clear to auscultation b/l with normal respiratory effort HEART: regular rate & rhythm ABDOMEN:  normoactive bowel sounds , non tender, not distended. Extremity: no pedal edema PSYCH: alert & oriented x 3 with fluent speech NEURO: no focal motor/sensory deficits   LABORATORY DATA:  I have reviewed the data as listed  . CBC Latest Ref Rng & Units 07/10/2021 04/08/2021 12/15/2020  WBC 4.0 - 10.5 K/uL 4.4 5.9 3.7(L)  Hemoglobin 13.0 - 17.0 g/dL 15.1 14.1 14.5  Hematocrit 39.0 - 52.0 % 44.0 40.4 42.0  Platelets 150 - 400 K/uL 241 289 258   . CMP Latest Ref Rng & Units 07/10/2021 04/08/2021 12/15/2020  Glucose 70 -  99 mg/dL 114(H) 83 112(H)  BUN 8 - 23 mg/dL _0 Creatinine 0.61 - 1.24 mg/dL 0.78 0.81 0.79  Sodium 135 - 145 mmol/L 140 140 140  Potassium 3.5 - 5.1 mmol/L 3.7 3.7 4.2  Chloride 98 - 111 mmol/L 104 106 105  CO2 22 - 32 mmol/L _1 Calcium 8.9 - 10.3 mg/dL 9.3 9.0 9.3  Total Protein 6.5 - 8.1 g/dL 7.0 6.6 6.3(L)  Total Bilirubin 0.3 - 1.2 mg/dL 0.5 0.5 0.4  Alkaline Phos 38 - 126 U/L 65 66 67  AST 15 - 41 U/L _2 ALT 0 - 44 U/L 21 39 25  . Lab Results  Component Value Date   LDH 122 07/10/2021    04/10/2020 FISH Analysis [id number]):   04/10/2020 Left Cervical Lymph Node Bx ([id number]):    RADIOGRAPHIC STUDIES: I have personally reviewed the radiological images as listed and agreed with the findings in the report. No results found.   ASSESSMENT & PLAN:   1) h/o Stage IIIA Large B cell lymphoma - Activated B cell type 04/08/2020 PET/CT ([id number]) revealed "1. Intensely hypermetabolic bilateral neck, bilateral axillary, bilateral mediastinal, bilateral hilar and retroperitoneal lymphadenopathy. Numerous small hypermetabolic splenic masses. Findings are compatible with malignancy, favoring lymphoproliferative  disorder." 04/10/2020 Left Cervical Lymph Node Bx ([id number]) revealed "Diffuse large B-cell lymphoma. KI-67 is 80-90%" FISH panel neg for double/triplet hit lymphoma PET/CT 12/22 "1. Marked interval response to therapy with resolution of nearly all areas of increased FDG uptake compared to the previous study. Most areas with residual uptake Deauville category 3. Subcarinal and RIGHT thoracic inlet with uptake slightly greater than liver, technically Deauville category 4 as described. Maximum SUV on today's study is 4.3 in the subcarinal region. 2. No new signs of disease. 3. Diffuse marrow activity presumably related to marrow stimulation. Suggest attention on follow-up. Aortic Atherosclerosis (ICD10-I70.0)." S/p 6 cycles of EPOCH-R  2) Recent COVID 19 and RSV infections -- resolved  3) Fatigue -waxing and waning and at times affecting quality of life significantly. PLAN: -Patient's labs today show normal CBC normal CMP and normal LDH -He does not appear to have any obvious lab or clinical examination findings suggestive of recurrent lymphoma. -He is here with his wife and does note significant fatigue at times which is limiting his quality of life. -We discussed multiple potential possibilities. -He would like to evaluate some of these objective things with his primary care physician closer to where he is living on the [facility] currently.  Would recommend follow-up with primary care physician to get thyroid function test TSH, free T4. -Would also recommend checking total testosterone and free testosterone levels which could be low in the context of chemotherapy -Cannot rule out some element of chronic fatigue from his recurrent COVID-19 and RSV infections back to back. -Low likelihood of lymphoma recurrence/progression but with significant fatigue symptoms would recommend CT neck chest abdomen pelvis with primary care physician.  I offered to do it here but the patient would find  that inconvenient since he has to drive back to his home at [facility] -Would also recommend getting echo to rule out any change in EF or anthracycline related cardiomyopathy or viral infection related cardiomyopathy. -These results could be faxed to our cancer center for our review once done. - FOLLOW UP: RTC with Dr Irene Limbo with labs in 3 months Additional labs, ECHO and CT CAP with PCP locally.  The total time spent in the appointment  was 32 minutes*.  All of the patient's questions were answered with apparent satisfaction. The patient knows to call the clinic with any problems, questions or concerns.   Sullivan Lone MD [place] AAHIVMS [facility] Hematology/Oncology Physician [facility]  .*Total Encounter Time as defined by the Centers for Medicare and Medicaid Services includes, in addition to the face-to-face time of a patient visit (documented in the note above) non-face-to-face time: obtaining and reviewing outside history, ordering and reviewing medications, tests or procedures, care coordination (communications with other health care professionals or caregivers) and documentation in the medical record.

## 2021-07-13 ENCOUNTER — Telehealth: Payer: Self-pay | Admitting: Hematology

## 2021-07-13 NOTE — Telephone Encounter (Signed)
Left message with follow-up appointment per 2/3 los.

## 2021-07-16 ENCOUNTER — Encounter: Payer: Self-pay | Admitting: Hematology

## 2021-09-29 ENCOUNTER — Encounter: Payer: Self-pay | Admitting: Hematology

## 2021-10-05 ENCOUNTER — Other Ambulatory Visit: Payer: Self-pay

## 2021-10-05 DIAGNOSIS — C8338 Diffuse large B-cell lymphoma, lymph nodes of multiple sites: Secondary | ICD-10-CM

## 2021-10-06 ENCOUNTER — Encounter: Payer: Self-pay | Admitting: Hematology

## 2021-10-06 ENCOUNTER — Other Ambulatory Visit: Payer: Self-pay

## 2021-10-06 ENCOUNTER — Inpatient Hospital Stay: Payer: No Typology Code available for payment source | Attending: Hematology | Admitting: Hematology

## 2021-10-06 ENCOUNTER — Inpatient Hospital Stay: Payer: No Typology Code available for payment source

## 2021-10-06 VITALS — BP 99/74 | HR 61 | Temp 97.7°F | Resp 20 | Wt 137.4 lb

## 2021-10-06 DIAGNOSIS — R5383 Other fatigue: Secondary | ICD-10-CM | POA: Diagnosis not present

## 2021-10-06 DIAGNOSIS — C8338 Diffuse large B-cell lymphoma, lymph nodes of multiple sites: Secondary | ICD-10-CM | POA: Diagnosis not present

## 2021-10-06 DIAGNOSIS — Z8572 Personal history of non-Hodgkin lymphomas: Secondary | ICD-10-CM | POA: Diagnosis present

## 2021-10-06 LAB — CMP (CANCER CENTER ONLY)
ALT: 17 U/L (ref 0–44)
AST: 17 U/L (ref 15–41)
Albumin: 4.1 g/dL (ref 3.5–5.0)
Alkaline Phosphatase: 60 U/L (ref 38–126)
Anion gap: 6 (ref 5–15)
BUN: 14 mg/dL (ref 8–23)
CO2: 28 mmol/L (ref 22–32)
Calcium: 9 mg/dL (ref 8.9–10.3)
Chloride: 105 mmol/L (ref 98–111)
Creatinine: 0.79 mg/dL (ref 0.61–1.24)
GFR, Estimated: >60 mL/min (ref >60)
Glucose, Bld: 76 mg/dL (ref 70–99)
Potassium: 4 mmol/L (ref 3.5–5.1)
Sodium: 139 mmol/L (ref 135–145)
Total Bilirubin: 0.5 mg/dL (ref 0.3–1.2)
Total Protein: 6.1 g/dL — ABNORMAL LOW (ref 6.5–8.1)

## 2021-10-06 LAB — CBC WITH DIFFERENTIAL (CANCER CENTER ONLY)
Abs Immature Granulocytes: 0.02 10*3/uL (ref 0.00–0.07)
Basophils Absolute: 0.1 10*3/uL (ref 0.0–0.1)
Basophils Relative: 2 %
Eosinophils Absolute: 0.3 10*3/uL (ref 0.0–0.5)
Eosinophils Relative: 5 %
HCT: 40.7 % (ref 39.0–52.0)
Hemoglobin: 13.9 g/dL (ref 13.0–17.0)
Immature Granulocytes: 0 %
Lymphocytes Relative: 23 %
Lymphs Abs: 1.2 10*3/uL (ref 0.7–4.0)
MCH: 31.2 pg (ref 26.0–34.0)
MCHC: 34.2 g/dL (ref 30.0–36.0)
MCV: 91.5 fL (ref 80.0–100.0)
Monocytes Absolute: 0.5 10*3/uL (ref 0.1–1.0)
Monocytes Relative: 10 %
Neutro Abs: 3.3 10*3/uL (ref 1.7–7.7)
Neutrophils Relative %: 60 %
Platelet Count: 245 10*3/uL (ref 150–400)
RBC: 4.45 MIL/uL (ref 4.22–5.81)
RDW: 12.1 % (ref 11.5–15.5)
WBC Count: 5.4 10*3/uL (ref 4.0–10.5)
nRBC: 0 % (ref 0.0–0.2)

## 2021-10-06 LAB — LACTATE DEHYDROGENASE: LDH: 113 U/L (ref 98–192)

## 2021-10-11 ENCOUNTER — Encounter: Payer: Self-pay | Admitting: Hematology

## 2021-10-11 NOTE — Progress Notes (Signed)
? ? ?HEMATOLOGY/ONCOLOGY CLINIC NOTE ? ?Date of Service: .10/06/2021 ? ? ?Patient Care Team: ?System, Provider Not In as PCP - General ? 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 ? ?HISTORY OF PRESENTING ILLNESS:  ?See previous note for details on initial presentation ? ?INTERVAL HISTORY: ? ?Bryan [name] is here for continued evaluation and management of large B-cell lymphoma.  He is accompanied by his wife Bryan [name] for this clinic visit.  His last clinic visit with [place] was about 3 months ago.Bryan [name] ?He notes that since his last visit his energy level and stamina have been progressively improving and he has not been significantly short of breath and has been able to do much more activity.  He decided to hold off on additional lab work-up CT chest abdomen pelvis and echocardiogram that we had discussed last time. ?She notes no new lumps or bumps. ?Has gained about 4 pounds in the last 6 months.Bryan [name] ?Improved p.o. intake.  Eating healthy. ?He has started referring for some sports and has been able to stay more physically active as a result. ?Labs done today were reviewed in details with the patient. ?MEDICAL HISTORY:  ?Past Medical History:  ?Diagnosis Date  ? Prostate cancer ([place])   ? ? ?SURGICAL HISTORY: ?Past Surgical History:  ?Procedure Laterality Date  ? CRYOTHERAPY    ? IR IMAGING GUIDED PORT INSERTION  05/08/2020  ? IR REMOVAL TUN ACCESS W/ PORT W/O FL MOD SED  10/14/2020  ? PROSTATE SURGERY    ? ? ?SOCIAL HISTORY: ?Social History  ? ?Socioeconomic History  ? Marital status: Married  ?  Spouse name: 1  ? Number of children: Not on file  ? Years of education: Not on file  ? Highest education level: Not on file  ?Occupational History  ? Occupation: Insurance underwriter  ?  Employer: [employer]  ?  Comment: medical leave  ?Tobacco Use  ? Smoking status: Never  ? Smokeless tobacco: Never  ?Vaping Use  ? Vaping Use: Never used  ?Substance and Sexual Activity  ?  Alcohol use: Not Currently  ? Drug use: Never  ? Sexual activity: Yes  ?Other Topics Concern  ? Not on file  ?Social History Narrative  ? Not on file  ? ?Social Determinants of Health  ? ?Financial Resource Strain: Not on file  ?Food Insecurity: Not on file  ?Transportation Needs: Not on file  ?Physical Activity: Not on file  ?Stress: Not on file  ?Social Connections: Not on file  ?Intimate Partner Violence: Not on file  ? ? ?FAMILY HISTORY: ?Family History  ?Problem Relation Age of Onset  ? Bladder Cancer Mother   ? Esophageal cancer Mother   ? Prostate cancer Father   ? Prostate cancer Paternal Uncle   ? ? ?ALLERGIES:  has No Known Allergies. ? ?MEDICATIONS:  ?Current Outpatient Medications  ?Medication Sig Dispense Refill  ? Acetaminophen-Codeine 300-30 MG tablet Take 1 tablet by mouth every 6 (six) hours as needed for pain. 30 tablet 0  ? LORazepam (ATIVAN) 0.5 MG tablet Take 1 tablet (0.5 mg total) by mouth every 8 (eight) hours as needed for anxiety. 30 tablet 0  ? senna-docusate (SENOKOT-S) 8.6-50 MG tablet Take 1 tablet by mouth 2 (two) times daily as needed for mild constipation.    ? Sodium Chloride-Sodium Bicarb (SODIUM BICARBONATE/SODIUM CHLORIDE) SOLN 1 application by Mouth Rinse route 4 (four) times daily.    ? ?No current facility-administered medications for this visit.  ? ? ?  REVIEW OF SYSTEMS:   ?10 Point review of Systems was done is negative except as noted above. ? ?PHYSICAL EXAMINATION: ?ECOG PERFORMANCE STATUS: 1 - Symptomatic but completely ambulatory ? ?. ?Vitals:  ? 10/06/21 1350  ?BP: 99/74  ?Pulse: 61  ?Resp: 20  ?Temp: 97.7 ?F (36.5 ?C)  ?SpO2: 100%  ? ? ?Filed Weights  ? 10/06/21 1350  ?Weight: 137 lb 6.4 oz (62.3 kg)  ? ? ?.Body mass index is 20.89 kg/m?Bryan [name]  ?NAD ?GENERAL:alert, in no acute distress and comfortable ?SKIN: no acute rashes, no significant lesions ?EYES: conjunctiva are pink and non-injected, sclera anicteric ?OROPHARYNX: MMM, no exudates, no oropharyngeal erythema or  ulceration ?NECK: supple, no JVD ?LYMPH:  no palpable lymphadenopathy in the cervical, axillary or inguinal regions ?LUNGS: clear to auscultation b/l with normal respiratory effort ?HEART: regular rate & rhythm ?ABDOMEN:  normoactive bowel sounds , non tender, not distended. ?Extremity: no pedal edema ?PSYCH: alert & oriented x 3 with fluent speech ?NEURO: no focal motor/sensory deficits ? ? ? ?LABORATORY DATA:  ?I have reviewed the data as listed ? ?. ? ?  Latest Ref Rng & Units 10/06/2021  ?  1:29 PM 07/10/2021  ? 12:12 PM 04/08/2021  ?  9:48 AM  ?CBC  ?WBC 4.0 - 10.5 K/uL 5.4   4.4   5.9    ?Hemoglobin 13.0 - 17.0 g/dL 13.9   15.1   14.1    ?Hematocrit 39.0 - 52.0 % 40.7   44.0   40.4    ?Platelets 150 - 400 K/uL 245   241   289    ? ?. ? ?  Latest Ref Rng & Units 10/06/2021  ?  1:29 PM 07/10/2021  ? 12:12 PM 04/08/2021  ?  9:48 AM  ?CMP  ?Glucose 70 - 99 mg/dL 76   114   83    ?BUN 8 - 23 mg/dL _0 ?Creatinine 0.61 - 1.24 mg/dL 0.79   0.78   0.81    ?Sodium 135 - 145 mmol/L 139   140   140    ?Potassium 3.5 - 5.1 mmol/L 4.0   3.7   3.7    ?Chloride 98 - 111 mmol/L 105   104   106    ?CO2 22 - 32 mmol/L _1 ?Calcium 8.9 - 10.3 mg/dL 9.0   9.3   9.0    ?Total Protein 6.5 - 8.1 g/dL 6.1   7.0   6.6    ?Total Bilirubin 0.3 - 1.2 mg/dL 0.5   0.5   0.5    ?Alkaline Phos 38 - 126 U/L 60   65   66    ?AST 15 - 41 U/L _2 ?ALT 0 - 44 U/L 17   21   39    ?Bryan [name] ?Lab Results  ?Component Value Date  ? LDH 113 10/06/2021  ? ? ?04/10/2020 FISH Analysis [id number]): ? ? ?04/10/2020 Left Cervical Lymph Node Bx ([id number]):  ? ? ?RADIOGRAPHIC STUDIES: ?I have personally reviewed the radiological images as listed and agreed with the findings in the report. ?No results found. ? ? ?ASSESSMENT & PLAN:  ? 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 Intensely hypermetabolic bilateral neck, bilateral axillary, bilateral mediastinal, bilateral hilar  and retroperitoneal lymphadenopathy. Numerous small hypermetabolic splenic  masses. Findings are compatible with malignancy, favoring lymphoproliferative disorder." ?04/10/2020 Left Cervical Lymph Node Bx ([id number]) revealed "Diffuse large B-cell lymphoma. KI-67 is 80-90%" ?FISH panel neg for double/triplet hit lymphoma ?PET/CT 12/22 "1. Marked interval response to therapy with resolution of nearly all areas of increased FDG uptake compared to the previous study. Most areas with residual uptake Deauville category 3. Subcarinal and RIGHT thoracic inlet with uptake slightly greater than liver, technically Deauville category 4 as described. Maximum SUV on today's study is 4.3 in the subcarinal region. 2. No new signs of disease. 3. Diffuse marrow activity presumably related to marrow stimulation. Suggest attention on follow-up. Aortic Atherosclerosis (ICD10-I70.0)." ?S/p 6 cycles of EPOCH-R ? ?2) history of COVID 19 and RSV infections in 2022 ? ?3) Fatigue -waxing and waning and at times affecting quality of life significantly. ?PLAN: ?-Patient's fatigue that he had noted during his last visit has significantly improved with improved appetite and increasing exercise tolerance.  Since he was feeling better he decided to forego pursuing the recommended echo, CT chest abdomen pelvis and additional labs with his PCP. ?-He notes that he has become more physically active and has been referring certain sports games for the [place]. ?-He notes no new focal symptoms.  No fevers no chills no night sweats no weight loss. ?-Labs done today showed normal CBC, normal CMP and normal LDH levels. ?-Patient has no clinical or lab evidence of lymphoma recurrence at this time. ?-We discussed several approaches to wellness physically mentally and spiritually and patient was very thankful for all the discussion. ?-Patient lives about 3 to 4 hours away from her cancer clinic and prefers to follow-up with a phone visit which is  quite appropriate at this time. ?-He will get CBC, CMP and LDH done locally with his PCP prior to his next clinic visit in 3 to 4 months. ? ?FOLLOW UP: ?Phone visit with Dr Irene Limbo in 3 months ?Labs will be done l

## 2021-10-19 ENCOUNTER — Telehealth: Payer: Self-pay | Admitting: Hematology

## 2021-10-19 NOTE — Telephone Encounter (Signed)
Scheduled follow-up appointment per 5/2 los. Patient is aware. ?

## 2021-11-11 ENCOUNTER — Other Ambulatory Visit: Payer: Self-pay | Admitting: Oncology

## 2022-01-04 ENCOUNTER — Encounter: Payer: Self-pay | Admitting: Hematology

## 2022-01-06 ENCOUNTER — Inpatient Hospital Stay: Payer: No Typology Code available for payment source | Admitting: Hematology

## 2022-07-28 ENCOUNTER — Telehealth: Payer: Self-pay | Admitting: Hematology

## 2022-07-28 ENCOUNTER — Encounter: Payer: Self-pay | Admitting: Hematology

## 2022-07-28 NOTE — Telephone Encounter (Signed)
Per 2/21 IB reached out to schedule patient, left voicemail for patient to call back for an appointment.

## 2022-09-14 ENCOUNTER — Telehealth: Payer: Self-pay | Admitting: Hematology

## 2022-09-14 NOTE — Telephone Encounter (Signed)
Reached back out to patient to schedule; patient aware of time and date of appointment.

## 2022-09-22 ENCOUNTER — Telehealth: Payer: Self-pay | Admitting: Hematology

## 2022-09-22 NOTE — Telephone Encounter (Signed)
Patient called to see if he can add  labs in [place] sent message to nurse.

## 2022-10-12 ENCOUNTER — Inpatient Hospital Stay: Payer: No Typology Code available for payment source | Attending: Hematology | Admitting: Hematology

## 2022-10-18 NOTE — Progress Notes (Signed)
This encounter was created in error - please disregard.
